# Patient Record
Sex: Female | Born: 1960 | Race: White | Hispanic: No | Marital: Married | Smoking: Never smoker
Health system: Southern US, Community
[De-identification: ages and names within clinical notes are randomized; demographics above are authoritative.]

## PROBLEM LIST (undated history)

## (undated) DIAGNOSIS — E119 Type 2 diabetes mellitus without complications: Secondary | ICD-10-CM

## (undated) DIAGNOSIS — Z923 Personal history of irradiation: Secondary | ICD-10-CM

## (undated) DIAGNOSIS — I10 Essential (primary) hypertension: Secondary | ICD-10-CM

## (undated) DIAGNOSIS — K259 Gastric ulcer, unspecified as acute or chronic, without hemorrhage or perforation: Secondary | ICD-10-CM

## (undated) DIAGNOSIS — F329 Major depressive disorder, single episode, unspecified: Secondary | ICD-10-CM

## (undated) DIAGNOSIS — I499 Cardiac arrhythmia, unspecified: Secondary | ICD-10-CM

## (undated) DIAGNOSIS — F32A Depression, unspecified: Secondary | ICD-10-CM

## (undated) DIAGNOSIS — C73 Malignant neoplasm of thyroid gland: Secondary | ICD-10-CM

## (undated) HISTORY — PX: REPAIR TENDONS FOOT: SUR1209

## (undated) HISTORY — PX: TONSILLECTOMY: SUR1361

## (undated) HISTORY — PX: OTHER SURGICAL HISTORY: SHX169

## (undated) HISTORY — PX: TOTAL ABDOMINAL HYSTERECTOMY: SHX209

## (undated) HISTORY — PX: ABDOMINAL HYSTERECTOMY: SHX81

## (undated) HISTORY — DX: Malignant neoplasm of thyroid gland: C73

## (undated) HISTORY — PX: TUBAL LIGATION: SHX77

## (undated) HISTORY — PX: BREAST REDUCTION SURGERY: SHX8

## (undated) HISTORY — PX: LUMBAR DISC SURGERY: SHX700

---

## 2003-12-09 ENCOUNTER — Inpatient Hospital Stay: Payer: Self-pay

## 2004-01-11 ENCOUNTER — Other Ambulatory Visit: Payer: Self-pay

## 2004-01-18 ENCOUNTER — Inpatient Hospital Stay: Payer: Self-pay | Admitting: Neonatology

## 2004-01-23 HISTORY — PX: ANTERIOR FUSION CERVICAL SPINE: SUR626

## 2005-08-25 ENCOUNTER — Ambulatory Visit: Payer: Self-pay | Admitting: Physician Assistant

## 2005-10-10 ENCOUNTER — Encounter: Payer: Self-pay | Admitting: Unknown Physician Specialty

## 2005-10-23 ENCOUNTER — Ambulatory Visit: Payer: Self-pay | Admitting: Unknown Physician Specialty

## 2007-09-03 ENCOUNTER — Ambulatory Visit: Payer: Self-pay | Admitting: Obstetrics and Gynecology

## 2008-08-18 ENCOUNTER — Emergency Department: Payer: Self-pay | Admitting: Emergency Medicine

## 2008-09-08 ENCOUNTER — Ambulatory Visit: Payer: Self-pay | Admitting: Obstetrics and Gynecology

## 2009-01-22 HISTORY — PX: REDUCTION MAMMAPLASTY: SUR839

## 2009-01-22 HISTORY — PX: ESOPHAGOGASTRODUODENOSCOPY: SHX1529

## 2009-01-23 LAB — HM COLONOSCOPY: HM Colonoscopy: NORMAL

## 2009-05-23 ENCOUNTER — Ambulatory Visit: Payer: Self-pay | Admitting: Gastroenterology

## 2010-01-22 HISTORY — PX: LITHOTRIPSY: SUR834

## 2010-02-08 ENCOUNTER — Ambulatory Visit: Payer: Self-pay | Admitting: Family Medicine

## 2010-10-08 ENCOUNTER — Emergency Department: Payer: Self-pay | Admitting: Emergency Medicine

## 2010-10-16 ENCOUNTER — Ambulatory Visit: Payer: Self-pay | Admitting: Urology

## 2010-10-18 ENCOUNTER — Ambulatory Visit: Payer: Self-pay | Admitting: Urology

## 2010-10-24 ENCOUNTER — Ambulatory Visit: Payer: Self-pay | Admitting: Urology

## 2010-11-09 ENCOUNTER — Ambulatory Visit: Payer: Self-pay | Admitting: Anesthesiology

## 2010-11-27 ENCOUNTER — Ambulatory Visit: Payer: Self-pay | Admitting: Urology

## 2011-06-05 ENCOUNTER — Ambulatory Visit: Payer: Self-pay | Admitting: Internal Medicine

## 2012-04-10 ENCOUNTER — Ambulatory Visit: Payer: Self-pay | Admitting: Internal Medicine

## 2012-05-07 ENCOUNTER — Ambulatory Visit: Payer: Self-pay | Admitting: Internal Medicine

## 2012-06-19 ENCOUNTER — Emergency Department: Payer: Self-pay | Admitting: Emergency Medicine

## 2012-10-28 ENCOUNTER — Ambulatory Visit: Payer: Self-pay | Admitting: Internal Medicine

## 2013-01-22 HISTORY — PX: NASAL SINUS SURGERY: SHX719

## 2013-05-14 ENCOUNTER — Ambulatory Visit: Payer: Self-pay | Admitting: Otolaryngology

## 2014-01-13 ENCOUNTER — Ambulatory Visit: Payer: Self-pay | Admitting: Internal Medicine

## 2014-01-13 LAB — HM MAMMOGRAPHY: HM MAMMO: NORMAL

## 2014-01-29 LAB — LIPID PANEL
Cholesterol: 236 mg/dL — AB (ref 0–200)
HDL: 46 mg/dL (ref 35–70)
LDL Cholesterol: 152 mg/dL
TRIGLYCERIDES: 191 mg/dL — AB (ref 40–160)

## 2014-01-29 LAB — TSH: TSH: 1.6 u[IU]/mL (ref <=5.90)

## 2014-07-03 ENCOUNTER — Encounter: Payer: Self-pay | Admitting: Internal Medicine

## 2014-07-03 ENCOUNTER — Other Ambulatory Visit: Payer: Self-pay | Admitting: Internal Medicine

## 2014-07-03 DIAGNOSIS — F5101 Primary insomnia: Secondary | ICD-10-CM | POA: Insufficient documentation

## 2014-07-03 DIAGNOSIS — R000 Tachycardia, unspecified: Secondary | ICD-10-CM

## 2014-07-03 DIAGNOSIS — G44209 Tension-type headache, unspecified, not intractable: Secondary | ICD-10-CM | POA: Insufficient documentation

## 2014-07-03 DIAGNOSIS — M62830 Muscle spasm of back: Secondary | ICD-10-CM | POA: Insufficient documentation

## 2014-07-03 DIAGNOSIS — Z8711 Personal history of peptic ulcer disease: Secondary | ICD-10-CM | POA: Insufficient documentation

## 2014-07-03 DIAGNOSIS — G43009 Migraine without aura, not intractable, without status migrainosus: Secondary | ICD-10-CM | POA: Insufficient documentation

## 2014-07-03 HISTORY — DX: Tachycardia, unspecified: R00.0

## 2014-07-06 ENCOUNTER — Ambulatory Visit (INDEPENDENT_AMBULATORY_CARE_PROVIDER_SITE_OTHER): Payer: No Typology Code available for payment source | Admitting: Internal Medicine

## 2014-07-06 ENCOUNTER — Ambulatory Visit
Admission: RE | Admit: 2014-07-06 | Discharge: 2014-07-06 | Disposition: A | Discharge status: Home / Self Care | Payer: No Typology Code available for payment source | Source: Ambulatory Visit | Attending: Internal Medicine | Admitting: Internal Medicine

## 2014-07-06 ENCOUNTER — Encounter: Payer: Self-pay | Admitting: Internal Medicine

## 2014-07-06 VITALS — BP 136/78 | HR 72 | Ht 67.0 in | Wt 229.2 lb

## 2014-07-06 DIAGNOSIS — M549 Dorsalgia, unspecified: Secondary | ICD-10-CM | POA: Diagnosis not present

## 2014-07-06 DIAGNOSIS — R109 Unspecified abdominal pain: Secondary | ICD-10-CM

## 2014-07-06 DIAGNOSIS — J0101 Acute recurrent maxillary sinusitis: Secondary | ICD-10-CM

## 2014-07-06 MED ORDER — AMOXICILLIN-POT CLAVULANATE 875-125 MG PO TABS: 1.0000 | ORAL_TABLET | Freq: Two times a day (BID) | ORAL | Status: DC

## 2014-07-06 NOTE — Progress Notes (Signed)
Date:  07/06/2014   Name:  Gabrielle Tucker   DOB:  April 28, 1960   MRN:  [id number]   Chief Complaint: Sinusitis and Nephrolithiasis   History of Present Illness:  This is a 54 y.o. female who is presenting with the above issues. Sinusitis This is a new problem. The current episode started in the past 7 days. The problem has been gradually worsening since onset. There has been no fever. Associated symptoms include congestion (bringing up yellow phlegm), a hoarse voice, sinus pressure and a sore throat. Pertinent negatives include no chills, coughing, ear pain or headaches. Past treatments include oral decongestants (mucinex and neti pot).   Kidney stones Has a history of one previous stone in 2012 treated with lithotripsy by Dr. Madelin Headings at [facility].  She is not aware of any additional kidney stones noted.  Now having symptoms on left flank - sharp and intermittent enough to take her breath away.  Episodes are variable and started 2 weeks ago.  There has been no fever or hematuria.   Review of Systems:  Review of Systems  Constitutional: Negative for fever and chills.  HENT: Positive for congestion (bringing up yellow phlegm), hoarse voice, sinus pressure and sore throat. Negative for ear pain and hearing loss.   Respiratory: Negative for cough.   Genitourinary: Negative for dysuria and hematuria.  Musculoskeletal: Positive for back pain (left flank pain - unchanged and intermittent).  Neurological: Negative for dizziness and headaches.    Patient Active Problem List   Diagnosis Date Noted  . Back muscle spasm 07/03/2014  . H/O peptic ulcer 07/03/2014  . Migraine without aura and responsive to treatment 07/03/2014  . Muscle contraction headache 07/03/2014  . Idiopathic insomnia 07/03/2014  . Fast heart beat 07/03/2014    Prior to Admission medications   Medication Sig Start Date End Date Taking? Authorizing Provider  atenolol (TENORMIN) 50 MG tablet Take 1 tablet by mouth daily. 03/04/14   Yes Historical Provider, MD  fluticasone (FLONASE) 50 MCG/ACT nasal spray Place 2 sprays into the nose daily as needed. 03/29/14  Yes Historical Provider, MD  pantoprazole (PROTONIX) 40 MG tablet Take 1 tablet by mouth 2 (two) times daily. 04/04/14  Yes Historical Provider, MD  tiZANidine (ZANAFLEX) 4 MG tablet Take 1 tablet by mouth daily as needed. 03/29/14  Yes Historical Provider, MD  zaleplon (SONATA) 10 MG capsule Take 1 capsule by mouth at bedtime. 03/30/14  Yes Historical Provider, MD    No Known Allergies  Past Surgical History  Procedure Laterality Date  . Partail knee replacement Right   . Lumbar disc surgery    . Abdominal hysterectomy    . Breast reduction surgery    . Lithotripsy  2012    with setnet  . Nasal sinus surgery    . Esophagogastroduodenoscopy  2011    gastric ulcer    History  Substance Use Topics  . Smoking status: Never Smoker   . Smokeless tobacco: Not on file  . Alcohol Use: 1.2 oz/week    2 Standard drinks or equivalent per week     Medication list has been reviewed and updated.  Physical Examination:  Physical Exam  Constitutional: She is oriented to person, place, and time. She appears well-developed and well-nourished. No distress.  HENT:  Right Ear: External ear and ear canal normal. Tympanic membrane is not erythematous and not retracted.  Left Ear: External ear and ear canal normal. Tympanic membrane is not erythematous and not retracted.  Nose: Right sinus  exhibits maxillary sinus tenderness and frontal sinus tenderness. Left sinus exhibits maxillary sinus tenderness and frontal sinus tenderness.  Mouth/Throat: Uvula is midline and mucous membranes are normal. No oral lesions. No oropharyngeal exudate or posterior oropharyngeal erythema.  Eyes: Conjunctivae are normal.  Neck: Normal range of motion. Neck supple.  Cardiovascular: Normal rate, regular rhythm and normal heart sounds.   Pulmonary/Chest: Breath sounds normal. She has no wheezes.  She has no rales.  Lymphadenopathy:    She has no cervical adenopathy.  Neurological: She is alert and oriented to person, place, and time.  Skin: Skin is warm and dry.  Nursing note and vitals reviewed.   BP 136/78 mmHg  Pulse 72  Ht 5\' 7"  (1.702 m)  Wt 229 lb 3.2 oz (103.964 kg)  BMI 35.89 kg/m2  Assessment and Plan: 1. Acute recurrent maxillary sinusitis Continue otc therapy - amoxicillin-clavulanate (AUGMENTIN) 875-125 MG per tablet; Take 1 tablet by mouth 2 (two) times daily.  Dispense: 20 tablet; Refill: 0  2. Flank pain Possible kidney stone - not worsening UA negative If evidence of stone on KUB will refer back to [facility] - POCT urinalysis dipstick - DG Abd 1 View; Future   Halina Maidens, MD [facility]  07/06/2014

## 2014-07-19 ENCOUNTER — Ambulatory Visit
Admission: RE | Admit: 2014-07-19 | Discharge: 2014-07-19 | Disposition: A | Discharge status: Home / Self Care | Payer: No Typology Code available for payment source | Source: Ambulatory Visit | Attending: Internal Medicine | Admitting: Internal Medicine

## 2014-07-19 ENCOUNTER — Encounter: Payer: Self-pay | Admitting: Internal Medicine

## 2014-07-19 ENCOUNTER — Ambulatory Visit (INDEPENDENT_AMBULATORY_CARE_PROVIDER_SITE_OTHER): Payer: No Typology Code available for payment source | Admitting: Internal Medicine

## 2014-07-19 ENCOUNTER — Ambulatory Visit
Admission: RE | Admit: 2014-07-19 | Payer: No Typology Code available for payment source | Source: Ambulatory Visit | Admitting: *Deleted

## 2014-07-19 VITALS — BP 120/66 | HR 74 | Ht 66.0 in | Wt 228.0 lb

## 2014-07-19 DIAGNOSIS — R091 Pleurisy: Secondary | ICD-10-CM | POA: Diagnosis not present

## 2014-07-19 MED ORDER — METHYLPREDNISOLONE 4 MG PO TBPK: ORAL_TABLET | ORAL | Status: DC

## 2014-07-19 NOTE — Progress Notes (Signed)
Date:  07/19/2014   Name:  Gabrielle Tucker   DOB:  12-Mar-1960   MRN:  [id number]   Chief Complaint: Shortness of Breath Shortness of Breath This is a new problem. The current episode started yesterday. The problem occurs every few minutes. The problem has been unchanged. Associated symptoms include chest pain. Pertinent negatives include no abdominal pain, leg swelling or wheezing. There is no history of allergies or asthma.  She was recently treated for bronchitis - she finished antibiotics and symptoms of cough and congestion have resolved.  She denies hx of blood clots, leg swelling and recent prolonged immobilization.   Review of Systems:  Review of Systems  Respiratory: Positive for chest tightness and shortness of breath. Negative for cough and wheezing.   Cardiovascular: Positive for chest pain. Negative for leg swelling.  Gastrointestinal: Negative for abdominal pain.  Musculoskeletal: Negative for joint swelling and gait problem.    Patient Active Problem List   Diagnosis Date Noted  . Back muscle spasm 07/03/2014  . H/O peptic ulcer 07/03/2014  . Migraine without aura and responsive to treatment 07/03/2014  . Muscle contraction headache 07/03/2014  . Idiopathic insomnia 07/03/2014  . Fast heart beat 07/03/2014    Prior to Admission medications   Medication Sig Start Date End Date Taking? Authorizing Provider  amoxicillin-clavulanate (AUGMENTIN) 875-125 MG per tablet Take 1 tablet by mouth 2 (two) times daily. 07/06/14  Yes Glean Hess, MD  atenolol (TENORMIN) 50 MG tablet Take 1 tablet by mouth daily. 03/04/14  Yes Historical Provider, MD  fluticasone (FLONASE) 50 MCG/ACT nasal spray Place 2 sprays into the nose daily as needed. 03/29/14  Yes Historical Provider, MD  pantoprazole (PROTONIX) 40 MG tablet Take 1 tablet by mouth 2 (two) times daily. 04/04/14  Yes Historical Provider, MD  tiZANidine (ZANAFLEX) 4 MG tablet Take 1 tablet by mouth daily as needed. 03/29/14  Yes  Historical Provider, MD  zaleplon (SONATA) 10 MG capsule Take 1 capsule by mouth at bedtime. 03/30/14  Yes Historical Provider, MD    No Known Allergies  Past Surgical History  Procedure Laterality Date  . Partail knee replacement Right   . Lumbar disc surgery    . Abdominal hysterectomy    . Breast reduction surgery    . Lithotripsy  2012    with setnet  . Nasal sinus surgery  2015  . Esophagogastroduodenoscopy  2011    gastric ulcer    History  Substance Use Topics  . Smoking status: Never Smoker   . Smokeless tobacco: Not on file  . Alcohol Use: 1.2 oz/week    2 Standard drinks or equivalent per week     Medication list has been reviewed and updated.  Physical Examination:  Physical Exam  Constitutional: She is oriented to person, place, and time. She appears well-developed and well-nourished. No distress.  Neck: Neck supple. No thyromegaly present.  Cardiovascular: Normal rate, regular rhythm and normal heart sounds.   Pulmonary/Chest: Effort normal and breath sounds normal. No respiratory distress. She has no wheezes. She has no rales.  Musculoskeletal: She exhibits no edema or tenderness.  Lymphadenopathy:    She has no cervical adenopathy.  Neurological: She is alert and oriented to person, place, and time.  Psychiatric: She has a normal mood and affect.  Nursing note and vitals reviewed.   BP 120/66 mmHg  Pulse 74  Ht 5\' 6"  (1.676 m)  Wt 228 lb (103.42 kg)  BMI 36.82 kg/m2  SpO2 88%  Assessment  and Plan: 1. Pleurisy Suspected from recent URI Pt instructed to go to the ER if symptoms worsen to rule out PE - methylPREDNISolone (MEDROL DOSEPAK) 4 MG TBPK tablet; Take 6 po for one day, 5 po for one day, 4 po for one day, 3 po for one day, 2 po for one day then 1 po for one day then stop.  Dispense: 21 tablet; Refill: 0 - DG Chest 2 View; Future   Halina Maidens, MD [facility]  07/19/2014

## 2014-07-27 ENCOUNTER — Other Ambulatory Visit: Payer: Self-pay | Admitting: Cardiology

## 2014-07-27 DIAGNOSIS — K8020 Calculus of gallbladder without cholecystitis without obstruction: Secondary | ICD-10-CM

## 2014-07-30 ENCOUNTER — Ambulatory Visit
Admission: RE | Admit: 2014-07-30 | Discharge: 2014-07-30 | Disposition: A | Discharge status: Home / Self Care | Payer: No Typology Code available for payment source | Source: Ambulatory Visit | Attending: Cardiology | Admitting: Cardiology

## 2014-07-30 DIAGNOSIS — K760 Fatty (change of) liver, not elsewhere classified: Secondary | ICD-10-CM | POA: Diagnosis not present

## 2014-07-30 DIAGNOSIS — K8020 Calculus of gallbladder without cholecystitis without obstruction: Secondary | ICD-10-CM

## 2014-07-30 DIAGNOSIS — R1011 Right upper quadrant pain: Secondary | ICD-10-CM | POA: Diagnosis present

## 2014-08-03 DIAGNOSIS — K219 Gastro-esophageal reflux disease without esophagitis: Secondary | ICD-10-CM | POA: Insufficient documentation

## 2014-08-03 DIAGNOSIS — R079 Chest pain, unspecified: Secondary | ICD-10-CM | POA: Insufficient documentation

## 2014-08-05 ENCOUNTER — Encounter: Payer: Self-pay | Admitting: Internal Medicine

## 2014-08-06 ENCOUNTER — Encounter: Payer: Self-pay | Admitting: Internal Medicine

## 2014-08-06 ENCOUNTER — Ambulatory Visit (INDEPENDENT_AMBULATORY_CARE_PROVIDER_SITE_OTHER): Payer: No Typology Code available for payment source | Admitting: Internal Medicine

## 2014-08-06 VITALS — BP 124/64 | HR 60 | Ht 66.0 in | Wt 223.0 lb

## 2014-08-06 DIAGNOSIS — R079 Chest pain, unspecified: Secondary | ICD-10-CM | POA: Diagnosis not present

## 2014-08-06 DIAGNOSIS — Z8711 Personal history of peptic ulcer disease: Secondary | ICD-10-CM

## 2014-08-06 NOTE — Progress Notes (Signed)
Date:  08/06/2014   Name:  Gabrielle Tucker   DOB:  1960-07-07   MRN:  [id number]   Chief Complaint: No chief complaint on file. Follow up from Hospital admission for atypical chest pain.  EKG, CXR and CT chest were all negative.  Labs were normal.  She had had a treadmill stress test three days before admission that was normal. She reports that the only relief came after a SL NTG - the relief lasted less than one hour.  She was discharged on the same medication except that Carafate was added - she has not yet picked it up. She has a history of gastric ulcer and esophagitis about 2011.  She had continued on pantoprazole daily.  Her pain is located in the epigastric and substernal area - it is pressure-like and associated at times with shortness of breath.  It is only worsening by lying on her left side.   Movement of arms and torso and deep breathing does not change her symptoms.  Review of Systems:  Review of Systems  Constitutional: Negative for fever, chills, appetite change and fatigue.  HENT: Negative for sore throat and trouble swallowing.   Eyes: Negative for visual disturbance.  Respiratory: Positive for chest tightness and shortness of breath. Negative for cough, choking and wheezing.   Cardiovascular: Positive for chest pain. Negative for palpitations and leg swelling.  Gastrointestinal: Negative for nausea, abdominal pain, diarrhea and constipation.  Musculoskeletal: Negative for back pain.  Neurological: Negative for dizziness, weakness, light-headedness and headaches.    Patient Active Problem List   Diagnosis Date Noted  . Acid reflux 08/03/2014  . Chest pain 08/03/2014  . Back muscle spasm 07/03/2014  . H/O peptic ulcer 07/03/2014  . Migraine without aura and responsive to treatment 07/03/2014  . Muscle contraction headache 07/03/2014  . Idiopathic insomnia 07/03/2014  . Fast heart beat 07/03/2014    Prior to Admission medications   Medication Sig Start Date End Date  Taking? Authorizing Provider  atenolol (TENORMIN) 50 MG tablet Take 1 tablet by mouth daily. 03/04/14  Yes Historical Provider, MD  fluticasone (FLONASE) 50 MCG/ACT nasal spray Place 2 sprays into the nose daily as needed. 03/29/14  Yes Historical Provider, MD  pantoprazole (PROTONIX) 40 MG tablet Take 1 tablet by mouth 2 (two) times daily. 04/04/14  Yes Historical Provider, MD  tiZANidine (ZANAFLEX) 4 MG tablet Take 1 tablet by mouth daily as needed. 03/29/14  Yes Historical Provider, MD  zaleplon (SONATA) 10 MG capsule Take 1 capsule by mouth at bedtime. 03/30/14  Yes Historical Provider, MD  amoxicillin-clavulanate (AUGMENTIN) 875-125 MG per tablet Take 1 tablet by mouth 2 (two) times daily. Patient not taking: Reported on 08/06/2014 07/06/14   Glean Hess, MD  methylPREDNISolone (MEDROL DOSEPAK) 4 MG TBPK tablet Take 6 po for one day, 5 po for one day, 4 po for one day, 3 po for one day, 2 po for one day then 1 po for one day then stop. Patient not taking: Reported on 08/06/2014 07/19/14   Glean Hess, MD  sucralfate Ivin Booty) 1 G tablet  08/03/14   Historical Provider, MD    No Known Allergies  Past Surgical History  Procedure Laterality Date  . Partail knee replacement Right   . Lumbar disc surgery    . Abdominal hysterectomy    . Breast reduction surgery    . Lithotripsy  2012    with setnet  . Nasal sinus surgery  2015  . Esophagogastroduodenoscopy  2011    gastric ulcer    History  Substance Use Topics  . Smoking status: Never Smoker   . Smokeless tobacco: Not on file  . Alcohol Use: 1.2 oz/week    2 Standard drinks or equivalent per week     Medication list has been reviewed and updated.  Physical Examination:  Physical Exam  Constitutional: She appears well-developed and well-nourished. No distress.  Neck: Normal range of motion. Neck supple. No thyromegaly present.  Cardiovascular: Normal rate, regular rhythm and normal heart sounds.   Pulmonary/Chest: Effort  normal and breath sounds normal. She has no wheezes.  Musculoskeletal: She exhibits no edema.  Psychiatric: She has a normal mood and affect.    BP 124/64 mmHg  Pulse 60  Ht 5\' 6"  (1.676 m)  Wt 223 lb (101.152 kg)  BMI 36.01 kg/m2  Assessment and Plan: 1. Chest pain, unspecified chest pain type Cardiac and pulmonary etiology essentially ruled out Need GI evaluation - Ambulatory referral to Gastroenterology  2. H/O peptic ulcer Pt to continue pantoprazole and begin Carafate slurry 1 gm qid Refer to GI for consideration of EGD and/or esophageal manometry (suspicion for a motility disorder given response to NTG) She has seen Dr. Donnella Sham in the past - Ambulatory referral to Gastroenterology   Halina Maidens, MD [facility]  08/06/2014

## 2014-08-09 ENCOUNTER — Inpatient Hospital Stay: Payer: Self-pay | Admitting: Internal Medicine

## 2014-10-05 ENCOUNTER — Telehealth: Payer: Self-pay

## 2014-10-05 MED ORDER — FLUCONAZOLE 100 MG PO TABS: 100.0000 mg | ORAL_TABLET | Freq: Every day | ORAL | Status: DC

## 2014-10-05 NOTE — Telephone Encounter (Signed)
Patient has yeast infection, Monistat does not work, would like something called in.dr

## 2014-10-11 ENCOUNTER — Other Ambulatory Visit: Payer: Self-pay | Admitting: Internal Medicine

## 2014-10-11 MED ORDER — ZALEPLON 10 MG PO CAPS: 10.0000 mg | ORAL_CAPSULE | Freq: Every day | ORAL | Status: DC

## 2014-10-11 MED ORDER — TIZANIDINE HCL 4 MG PO TABS: 4.0000 mg | ORAL_TABLET | Freq: Every day | ORAL | Status: DC | PRN

## 2014-10-27 ENCOUNTER — Telehealth: Payer: Self-pay

## 2014-10-27 NOTE — Telephone Encounter (Signed)
Patient informed.dr 

## 2014-10-27 NOTE — Telephone Encounter (Signed)
Patient having vaginal dryness, needs you to recommend something for this OTC or call in something.dr

## 2014-10-27 NOTE — Telephone Encounter (Signed)
I suggest she try over-the-counter KY jelly lubricant. If this does not help and she needs a prescription medication and she will need an appointment.

## 2014-12-29 ENCOUNTER — Ambulatory Visit (INDEPENDENT_AMBULATORY_CARE_PROVIDER_SITE_OTHER): Payer: No Typology Code available for payment source | Admitting: Internal Medicine

## 2014-12-29 ENCOUNTER — Encounter: Payer: Self-pay | Admitting: Internal Medicine

## 2014-12-29 VITALS — BP 110/70 | HR 80 | Temp 97.8°F | Ht 66.0 in | Wt 225.0 lb

## 2014-12-29 DIAGNOSIS — J018 Other acute sinusitis: Secondary | ICD-10-CM

## 2014-12-29 MED ORDER — CEFDINIR 300 MG PO CAPS: 300.0000 mg | ORAL_CAPSULE | Freq: Two times a day (BID) | ORAL | Status: DC

## 2014-12-29 MED ORDER — FLUTICASONE PROPIONATE 50 MCG/ACT NA SUSP: 2.0000 | Freq: Every day | NASAL | Status: DC

## 2014-12-29 NOTE — Progress Notes (Signed)
Date:  12/29/2014   Name:  Gabrielle Tucker   DOB:  05/27/1960   MRN:  [id number]   Chief Complaint: Sinusitis; Cough; and Sore Throat  Patient started off 4-5 days ago with a sore throat. This progressed to sinus pressure, nasal congestion, throat fullness and dry cough. She's been using Mucinex D and sinus rinses. She denies fever, shortness of breath, wheezing. She is able to rest without significant disruption.   Review of Systems  Constitutional: Positive for fatigue. Negative for fever, chills and unexpected weight change.  HENT: Positive for congestion, postnasal drip, sinus pressure and sore throat. Negative for ear pain and hearing loss.   Eyes: Negative for visual disturbance.  Respiratory: Negative for cough, chest tightness, shortness of breath and wheezing.   Cardiovascular: Negative for chest pain and palpitations.  Musculoskeletal: Negative for neck pain and neck stiffness.  Skin: Negative for color change and rash.  Neurological: Positive for headaches. Negative for dizziness, light-headedness and numbness.    Patient Active Problem List   Diagnosis Date Noted  . Acid reflux 08/03/2014  . Chest pain 08/03/2014  . Back muscle spasm 07/03/2014  . H/O peptic ulcer 07/03/2014  . Migraine without aura and responsive to treatment 07/03/2014  . Muscle contraction headache 07/03/2014  . Idiopathic insomnia 07/03/2014  . Fast heart beat 07/03/2014    Prior to Admission medications   Medication Sig Start Date End Date Taking? Authorizing Provider  atenolol (TENORMIN) 50 MG tablet Take 1 tablet by mouth daily. 03/04/14  Yes Historical Provider, MD  fluticasone (FLONASE) 50 MCG/ACT nasal spray Place 2 sprays into the nose daily as needed. 03/29/14  Yes Historical Provider, MD  pantoprazole (PROTONIX) 40 MG tablet Take 1 tablet by mouth 2 (two) times daily. 04/04/14  Yes Historical Provider, MD  tiZANidine (ZANAFLEX) 4 MG tablet Take 1 tablet (4 mg total) by mouth daily as needed.  10/11/14  Yes Glean Hess, MD  zaleplon (SONATA) 10 MG capsule Take 1 capsule (10 mg total) by mouth at bedtime. 10/11/14  Yes Glean Hess, MD    No Known Allergies  Past Surgical History  Procedure Laterality Date  . Partail knee replacement Right   . Lumbar disc surgery    . Abdominal hysterectomy    . Breast reduction surgery    . Lithotripsy  2012    with setnet  . Nasal sinus surgery  2015  . Esophagogastroduodenoscopy  2011    gastric ulcer    Social History  Substance Use Topics  . Smoking status: Never Smoker   . Smokeless tobacco: None  . Alcohol Use: 1.2 oz/week    2 Standard drinks or equivalent per week    Medication list has been reviewed and updated.   Physical Exam  Constitutional: She is oriented to person, place, and time. She appears well-developed and well-nourished. No distress.  HENT:  Head: Normocephalic and atraumatic.  Right Ear: External ear and ear canal normal. Tympanic membrane is retracted. Tympanic membrane is not erythematous.  Left Ear: External ear and ear canal normal. Tympanic membrane is retracted. Tympanic membrane is not erythematous.  Nose: Right sinus exhibits frontal sinus tenderness. Right sinus exhibits no maxillary sinus tenderness. Left sinus exhibits frontal sinus tenderness. Left sinus exhibits no maxillary sinus tenderness.  Mouth/Throat: Uvula is midline, oropharynx is clear and moist and mucous membranes are normal. No oral lesions. No oropharyngeal exudate.  Eyes: Conjunctivae and EOM are normal. Right eye exhibits no discharge. Left eye exhibits no  discharge. No scleral icterus.  Neck: Normal range of motion. Carotid bruit is not present. No erythema present. No thyromegaly present.  Cardiovascular: Normal rate, regular rhythm, normal heart sounds and normal pulses.   Pulmonary/Chest: Effort normal and breath sounds normal. No respiratory distress. She has no wheezes. She has no rales.  Lymphadenopathy:    She has  no cervical adenopathy.  Neurological: She is alert and oriented to person, place, and time. She has normal reflexes.  Psychiatric: She has a normal mood and affect. Her speech is normal and behavior is normal. Thought content normal.  Nursing note and vitals reviewed.   BP 110/70 mmHg  Pulse 80  Temp(Src) 97.8 F (36.6 C)  Ht 5\' 6"  (1.676 m)  Wt 225 lb (102.059 kg)  BMI 36.33 kg/m2  SpO2 97%  Assessment and Plan: 1. Other acute sinusitis Resume Flonase; continue sinus rinses, continue Mucinex D - fluticasone (FLONASE) 50 MCG/ACT nasal spray; Place 2 sprays into both nostrils daily.  Dispense: 16 g; Refill: 3 - cefdinir (OMNICEF) 300 MG capsule; Take 1 capsule (300 mg total) by mouth 2 (two) times daily.  Dispense: 20 capsule; Refill: 0   Halina Maidens, MD [facility]  12/29/2014

## 2015-01-21 ENCOUNTER — Ambulatory Visit (INDEPENDENT_AMBULATORY_CARE_PROVIDER_SITE_OTHER): Payer: No Typology Code available for payment source | Admitting: Internal Medicine

## 2015-01-21 ENCOUNTER — Encounter: Payer: Self-pay | Admitting: Internal Medicine

## 2015-01-21 VITALS — BP 110/60 | HR 76 | Ht 66.0 in | Wt 224.6 lb

## 2015-01-21 DIAGNOSIS — G44209 Tension-type headache, unspecified, not intractable: Secondary | ICD-10-CM

## 2015-01-21 DIAGNOSIS — Z1159 Encounter for screening for other viral diseases: Secondary | ICD-10-CM | POA: Diagnosis not present

## 2015-01-21 DIAGNOSIS — R000 Tachycardia, unspecified: Secondary | ICD-10-CM | POA: Diagnosis not present

## 2015-01-21 DIAGNOSIS — F5101 Primary insomnia: Secondary | ICD-10-CM | POA: Diagnosis not present

## 2015-01-21 DIAGNOSIS — Z1239 Encounter for other screening for malignant neoplasm of breast: Secondary | ICD-10-CM | POA: Diagnosis not present

## 2015-01-21 DIAGNOSIS — Z0000 Encounter for general adult medical examination without abnormal findings: Secondary | ICD-10-CM

## 2015-01-21 DIAGNOSIS — Z8711 Personal history of peptic ulcer disease: Secondary | ICD-10-CM

## 2015-01-21 DIAGNOSIS — G43009 Migraine without aura, not intractable, without status migrainosus: Secondary | ICD-10-CM

## 2015-01-21 LAB — POCT URINALYSIS DIPSTICK
Bilirubin, UA: NEGATIVE
Blood, UA: NEGATIVE
GLUCOSE UA: NEGATIVE
Ketones, UA: NEGATIVE
Leukocytes, UA: NEGATIVE
Nitrite, UA: NEGATIVE
Protein, UA: NEGATIVE
SPEC GRAV UA: 1.02
UROBILINOGEN UA: 0.2
pH, UA: 5

## 2015-01-21 NOTE — Progress Notes (Signed)
Date:  01/21/2015   Name:  Gabrielle Tucker   DOB:  29-Jul-1960   MRN:  [id number]   Chief Complaint: Annual Exam; Headache; and Gastroesophageal Reflux Gabrielle Tucker is a 54 y.o. female who presents today for her Complete Annual Exam. She feels well. She reports exercising none. She reports she is sleeping well on medication. She is due for mammogram.  Headache  This is a recurrent problem. The current episode started more than 1 year ago. The problem occurs monthly. The problem has been unchanged. Associated symptoms include abdominal pain and muscle aches. Pertinent negatives include no fever, hearing loss, nausea, numbness or tinnitus.  Gastroesophageal Reflux She complains of abdominal pain and heartburn. She reports no chest pain, no nausea or no wheezing. This is a recurrent problem. The current episode started more than 1 year ago. The problem occurs occasionally. The symptoms are aggravated by certain foods. Pertinent negatives include no fatigue. She has tried a PPI for the symptoms. Past procedures include an EGD.    Review of Systems  Constitutional: Negative for fever, chills and fatigue.  HENT: Negative for hearing loss, tinnitus, trouble swallowing and voice change.   Eyes: Negative for visual disturbance.  Respiratory: Negative for chest tightness, shortness of breath and wheezing.   Cardiovascular: Negative for chest pain, palpitations and leg swelling.  Gastrointestinal: Positive for heartburn and abdominal pain. Negative for nausea, diarrhea and constipation.  Genitourinary: Negative for dysuria, vaginal bleeding and vaginal discharge.  Musculoskeletal: Positive for arthralgias (knee). Negative for myalgias and joint swelling.  Skin: Negative for color change and rash.  Neurological: Positive for headaches. Negative for tremors, syncope and numbness.  Hematological: Negative for adenopathy. Does not bruise/bleed easily.  Psychiatric/Behavioral: Positive for  sleep disturbance. Negative for dysphoric mood and decreased concentration.    Patient Active Problem List   Diagnosis Date Noted  . Chest pain 08/03/2014  . Back muscle spasm 07/03/2014  . H/O peptic ulcer 07/03/2014  . Migraine without aura and responsive to treatment 07/03/2014  . Muscle contraction headache 07/03/2014  . Idiopathic insomnia 07/03/2014  . Fast heart beat 07/03/2014    Prior to Admission medications   Medication Sig Start Date End Date Taking? Authorizing Provider  atenolol (TENORMIN) 50 MG tablet Take 1 tablet by mouth daily. 03/04/14  Yes Historical Provider, MD  fluticasone (FLONASE) 50 MCG/ACT nasal spray Place 2 sprays into both nostrils daily. 12/29/14  Yes Glean Hess, MD  pantoprazole (PROTONIX) 40 MG tablet Take 1 tablet by mouth 2 (two) times daily. 04/04/14  Yes Historical Provider, MD  tiZANidine (ZANAFLEX) 4 MG tablet Take 1 tablet (4 mg total) by mouth daily as needed. 10/11/14  Yes Glean Hess, MD  zaleplon (SONATA) 10 MG capsule Take 1 capsule (10 mg total) by mouth at bedtime. 10/11/14  Yes Glean Hess, MD    No Known Allergies  Past Surgical History  Procedure Laterality Date  . Partail knee replacement Right   . Lumbar disc surgery    . Abdominal hysterectomy    . Breast reduction surgery    . Lithotripsy  2012    with setnet  . Nasal sinus surgery  2015  . Esophagogastroduodenoscopy  2011    gastric ulcer    Social History  Substance Use Topics  . Smoking status: Never Smoker   . Smokeless tobacco: None  . Alcohol Use: 1.2 oz/week    2 Standard drinks or equivalent per week    Medication list  has been reviewed and updated.   Physical Exam  Constitutional: She is oriented to person, place, and time. She appears well-developed and well-nourished. No distress.  HENT:  Head: Normocephalic and atraumatic.  Right Ear: Tympanic membrane and ear canal normal.  Left Ear: Tympanic membrane and ear canal normal.  Nose: Right  sinus exhibits no maxillary sinus tenderness. Left sinus exhibits no maxillary sinus tenderness.  Mouth/Throat: Uvula is midline and oropharynx is clear and moist.  Eyes: Conjunctivae and EOM are normal. Right eye exhibits no discharge. Left eye exhibits no discharge. No scleral icterus.  Neck: Normal range of motion. Carotid bruit is not present. No erythema present. No thyromegaly present.  Cardiovascular: Normal rate, regular rhythm, normal heart sounds and normal pulses.   Pulmonary/Chest: Effort normal. No respiratory distress. She has no wheezes. Right breast exhibits skin change. Right breast exhibits no mass, no nipple discharge and no tenderness. Left breast exhibits skin change. Left breast exhibits no mass, no nipple discharge and no tenderness.  Bilateral breast reduction scars  Abdominal: Soft. Bowel sounds are normal. There is no hepatosplenomegaly. There is no tenderness. There is no CVA tenderness.  Musculoskeletal: Normal range of motion.  Lymphadenopathy:    She has no cervical adenopathy.    She has no axillary adenopathy.  Neurological: She is alert and oriented to person, place, and time. She has normal reflexes. No cranial nerve deficit or sensory deficit.  Skin: Skin is warm, dry and intact. No rash noted.  Psychiatric: She has a normal mood and affect. Her speech is normal and behavior is normal. Thought content normal.  Nursing note and vitals reviewed.   BP 110/60 mmHg  Pulse 76  Ht 5\' 6"  (1.676 m)  Wt 224 lb 9.6 oz (101.878 kg)  BMI 36.27 kg/m2  Assessment and Plan: 1. Annual physical exam Normal exam except for weight - recommend beginning regular exercise - POCT urinalysis dipstick  2. Breast cancer screening - MM DIGITAL SCREENING BILATERAL; Future  3. Migraine without aura and responsive to treatment Doing well - using muscle relaxants as needed  4. Idiopathic insomnia Controlled with medication  5. H/O peptic ulcer On bid PPI - CBC with  Differential/Platelet  6. Tachycardia Controlled by beta blocker - Comprehensive metabolic panel - TSH  7. Muscle contraction headache  8. Need for hepatitis C screening test - Hepatitis C antibody   Gabrielle Maidens, MD [facility]  01/21/2015

## 2015-01-21 NOTE — Patient Instructions (Signed)
Tdap Vaccine (Tetanus, Diphtheria and Pertussis): What You Need to Know 1. Why get vaccinated? Tetanus, diphtheria and pertussis are very serious diseases. Tdap vaccine can protect us from these diseases. And, Tdap vaccine given to pregnant women can protect newborn babies against pertussis. TETANUS (Lockjaw) is rare in the [place] today. It causes painful muscle tightening and stiffness, usually all over the body.  It can lead to tightening of muscles in the head and neck so you can't open your mouth, swallow, or sometimes even breathe. Tetanus kills about 1 out of 10 people who are infected even after receiving the best medical care. DIPHTHERIA is also rare in the [place] today. It can cause a thick coating to form in the back of the throat.  It can lead to breathing problems, heart failure, paralysis, and death. PERTUSSIS (Whooping Cough) causes severe coughing spells, which can cause difficulty breathing, vomiting and disturbed sleep.  It can also lead to weight loss, incontinence, and rib fractures. Up to 2 in 100 adolescents and 5 in 100 adults with pertussis are hospitalized or have complications, which could include pneumonia or death. These diseases are caused by bacteria. Diphtheria and pertussis are spread from person to person through secretions from coughing or sneezing. Tetanus enters the body through cuts, scratches, or wounds. Before vaccines, as many as 200,000 cases of diphtheria, 200,000 cases of pertussis, and hundreds of cases of tetanus, were reported in the [place] each year. Since vaccination began, reports of cases for tetanus and diphtheria have dropped by about 99% and for pertussis by about 80%. 2. Tdap vaccine Tdap vaccine can protect adolescents and adults from tetanus, diphtheria, and pertussis. One dose of Tdap is routinely given at age 11 or 12. People who did not get Tdap at that age should get it as soon as possible. Tdap is especially important  for healthcare professionals and anyone having close contact with a baby younger than 12 months. Pregnant women should get a dose of Tdap during every pregnancy, to protect the newborn from pertussis. Infants are most at risk for severe, life-threatening complications from pertussis. Another vaccine, called Td, protects against tetanus and diphtheria, but not pertussis. A Td booster should be given every 10 years. Tdap may be given as one of these boosters if you have never gotten Tdap before. Tdap may also be given after a severe cut or burn to prevent tetanus infection. Your doctor or the person giving you the vaccine can give you more information. Tdap may safely be given at the same time as other vaccines. 3. Some people should not get this vaccine  A person who has ever had a life-threatening allergic reaction after a previous dose of any diphtheria, tetanus or pertussis containing vaccine, OR has a severe allergy to any part of this vaccine, should not get Tdap vaccine. Tell the person giving the vaccine about any severe allergies.  Anyone who had coma or long repeated seizures within 7 days after a childhood dose of DTP or DTaP, or a previous dose of Tdap, should not get Tdap, unless a cause other than the vaccine was found. They can still get Td.  Talk to your doctor if you:  have seizures or another nervous system problem,  had severe pain or swelling after any vaccine containing diphtheria, tetanus or pertussis,  ever had a condition called Guillain-Barr Syndrome (GBS),  aren't feeling well on the day the shot is scheduled. 4. Risks With any medicine, including vaccines, there is   a chance of side effects. These are usually mild and go away on their own. Serious reactions are also possible but are rare. Most people who get Tdap vaccine do not have any problems with it. Mild problems following Tdap (Did not interfere with activities)  Pain where the shot was given (about 3 in 4  adolescents or 2 in 3 adults)  Redness or swelling where the shot was given (about 1 person in 5)  Mild fever of at least 100.4F (up to about 1 in 25 adolescents or 1 in 100 adults)  Headache (about 3 or 4 people in 10)  Tiredness (about 1 person in 3 or 4)  Nausea, vomiting, diarrhea, stomach ache (up to 1 in 4 adolescents or 1 in 10 adults)  Chills, sore joints (about 1 person in 10)  Body aches (about 1 person in 3 or 4)  Rash, swollen glands (uncommon) Moderate problems following Tdap (Interfered with activities, but did not require medical attention)  Pain where the shot was given (up to 1 in 5 or 6)  Redness or swelling where the shot was given (up to about 1 in 16 adolescents or 1 in 12 adults)  Fever over 102F (about 1 in 100 adolescents or 1 in 250 adults)  Headache (about 1 in 7 adolescents or 1 in 10 adults)  Nausea, vomiting, diarrhea, stomach ache (up to 1 or 3 people in 100)  Swelling of the entire arm where the shot was given (up to about 1 in 500). Severe problems following Tdap (Unable to perform usual activities; required medical attention)  Swelling, severe pain, bleeding and redness in the arm where the shot was given (rare). Problems that could happen after any vaccine:  People sometimes faint after a medical procedure, including vaccination. Sitting or lying down for about 15 minutes can help prevent fainting, and injuries caused by a fall. Tell your doctor if you feel dizzy, or have vision changes or ringing in the ears.  Some people get severe pain in the shoulder and have difficulty moving the arm where a shot was given. This happens very rarely.  Any medication can cause a severe allergic reaction. Such reactions from a vaccine are very rare, estimated at fewer than 1 in a million doses, and would happen within a few minutes to a few hours after the vaccination. As with any medicine, there is a very remote chance of a vaccine causing a serious  injury or death. The safety of vaccines is always being monitored. For more information, visit: www.cdc.gov/vaccinesafety/ 5. What if there is a serious problem? What should I look for?  Look for anything that concerns you, such as signs of a severe allergic reaction, very high fever, or unusual behavior.  Signs of a severe allergic reaction can include hives, swelling of the face and throat, difficulty breathing, a fast heartbeat, dizziness, and weakness. These would usually start a few minutes to a few hours after the vaccination. What should I do?  If you think it is a severe allergic reaction or other emergency that can't wait, call 9-1-1 or get the person to the nearest hospital. Otherwise, call your doctor.  Afterward, the reaction should be reported to the Vaccine Adverse Event Reporting System (VAERS). Your doctor might file this report, or you can do it yourself through the VAERS web site at www.vaers.hhs.gov, or by calling 1-800-822-7967. VAERS does not give medical advice.  6. The National Vaccine Injury Compensation Program The National Vaccine Injury Compensation Program (  VICP) is a federal program that was created to compensate people who may have been injured by certain vaccines. Persons who believe they may have been injured by a vaccine can learn about the program and about filing a claim by calling (623)641-9139 or visiting the [facility] website at GoldCloset.com.ee. There is a time limit to file a claim for compensation. 7. How can I learn more?  Ask your doctor. He or she can give you the vaccine package insert or suggest other sources of information.  Call your local or state health department.  Contact the Centers for Disease Control and Prevention (CDC):  Call (610)861-6046 (1-800-CDC-INFO) or  Visit CDC's website at http://hunter.com/ CDC Tdap Vaccine VIS (03/17/13)   This information is not intended to replace advice given to you by your health care  provider. Make sure you discuss any questions you have with your health care provider.   Document Released: 07/10/2011 Document Revised: 01/29/2014 Document Reviewed: 04/22/2013 Elsevier Interactive Patient Education 2016 [place] Practicing breast self-awareness may pick up problems early, prevent significant medical complications, and possibly save your life. By practicing breast self-awareness, you can become familiar with how your breasts look and feel and if your breasts are changing. This allows you to notice changes early. It can also offer you some reassurance that your breast health is good. One way to learn what is normal for your breasts and whether your breasts are changing is to do a breast self-exam. If you find a lump or something that was not present in the past, it is best to contact your caregiver right away. Other findings that should be evaluated by your caregiver include nipple discharge, especially if it is bloody; skin changes or reddening; areas where the skin seems to be pulled in (retracted); or new lumps and bumps. Breast pain is seldom associated with cancer (malignancy), but should also be evaluated by a caregiver. HOW TO PERFORM A BREAST SELF-EXAM The best time to examine your breasts is 5-7 days after your menstrual period is over. During menstruation, the breasts are lumpier, and it may be more difficult to pick up changes. If you do not menstruate, have reached menopause, or had your uterus removed (hysterectomy), you should examine your breasts at regular intervals, such as monthly. If you are breastfeeding, examine your breasts after a feeding or after using a breast pump. Breast implants do not decrease the risk for lumps or tumors, so continue to perform breast self-exams as recommended. Talk to your caregiver about how to determine the difference between the implant and breast tissue. Also, talk about the amount of pressure you should use  during the exam. Over time, you will become more familiar with the variations of your breasts and more comfortable with the exam. A breast self-exam requires you to remove all your clothes above the waist.  Look at your breasts and nipples. Stand in front of a mirror in a room with good lighting. With your hands on your hips, push your hands firmly downward. Look for a difference in shape, contour, and size from one breast to the other (asymmetry). Asymmetry includes puckers, dips, or bumps. Also, look for skin changes, such as reddened or scaly areas on the breasts. Look for nipple changes, such as discharge, dimpling, repositioning, or redness.  Carefully feel your breasts. This is best done either in the shower or tub while using soapy water or when flat on your back. Place the arm (on the side of the breast you  are examining) above your head. Use the pads (not the fingertips) of your three middle fingers on your opposite hand to feel your breasts. Start in the underarm area and use  inch (2 cm) overlapping circles to feel your breast. Use 3 different levels of pressure (light, medium, and firm pressure) at each circle before moving to the next circle. The light pressure is needed to feel the tissue closest to the skin. The medium pressure will help to feel breast tissue a little deeper, while the firm pressure is needed to feel the tissue close to the ribs. Continue the overlapping circles, moving downward over the breast until you feel your ribs below your breast. Then, move one finger-width towards the center of the body. Continue to use the  inch (2 cm) overlapping circles to feel your breast as you move slowly up toward the collar bone (clavicle) near the base of the neck. Continue the up and down exam using all 3 pressures until you reach the middle of the chest. Do this with each breast, carefully feeling for lumps or changes.   Keep a written record with breast changes or normal findings for each  breast. By writing this information down, you do not need to depend only on memory for size, tenderness, or location. Write down where you are in your menstrual cycle, if you are still menstruating. Breast tissue can have some lumps or thick tissue. However, see your caregiver if you find anything that concerns you.  SEEK MEDICAL CARE IF:  You see a change in shape, contour, or size of your breasts or nipples.   You see skin changes, such as reddened or scaly areas on the breasts or nipples.   You have an unusual discharge from your nipples.   You feel a new lump or unusually thick areas.    This information is not intended to replace advice given to you by your health care provider. Make sure you discuss any questions you have with your health care provider.   Document Released: 01/08/2005 Document Revised: 12/26/2011 Document Reviewed: 04/25/2011 Elsevier Interactive Patient Education [employer].

## 2015-01-22 LAB — COMPREHENSIVE METABOLIC PANEL
ALK PHOS: 103 IU/L (ref 39–117)
ALT: 38 IU/L — AB (ref 0–32)
AST: 34 IU/L (ref 0–40)
Albumin/Globulin Ratio: 1.8 (ref 1.1–2.5)
Albumin: 4.3 g/dL (ref 3.5–5.5)
BILIRUBIN TOTAL: 0.6 mg/dL (ref 0.0–1.2)
BUN/Creatinine Ratio: 14 (ref 9–23)
BUN: 9 mg/dL (ref 6–24)
CHLORIDE: 101 mmol/L (ref 96–106)
CO2: 26 mmol/L (ref 18–29)
Calcium: 9.1 mg/dL (ref 8.7–10.2)
Creatinine, Ser: 0.65 mg/dL (ref 0.57–1.00)
GFR calc Af Amer: 116 mL/min/{1.73_m2} (ref >59)
GFR calc non Af Amer: 100 mL/min/{1.73_m2} (ref >59)
GLUCOSE: 99 mg/dL (ref 65–99)
Globulin, Total: 2.4 g/dL (ref 1.5–4.5)
Potassium: 3.9 mmol/L (ref 3.5–5.2)
Sodium: 141 mmol/L (ref 134–144)
Total Protein: 6.7 g/dL (ref 6.0–8.5)

## 2015-01-22 LAB — TSH: TSH: 3.14 u[IU]/mL (ref 0.450–4.500)

## 2015-01-22 LAB — CBC WITH DIFFERENTIAL/PLATELET
Basophils Absolute: 0 10*3/uL (ref 0.0–0.2)
Basos: 0 %
EOS (ABSOLUTE): 0.1 10*3/uL (ref 0.0–0.4)
EOS: 2 %
HEMATOCRIT: 38.8 % (ref 34.0–46.6)
HEMOGLOBIN: 13 g/dL (ref 11.1–15.9)
Immature Grans (Abs): 0 10*3/uL (ref 0.0–0.1)
Immature Granulocytes: 0 %
Lymphocytes Absolute: 1.7 10*3/uL (ref 0.7–3.1)
Lymphs: 21 %
MCH: 30.4 pg (ref 26.6–33.0)
MCHC: 33.5 g/dL (ref 31.5–35.7)
MCV: 91 fL (ref 79–97)
MONOCYTES: 6 %
MONOS ABS: 0.4 10*3/uL (ref 0.1–0.9)
NEUTROS PCT: 71 %
Neutrophils Absolute: 5.6 10*3/uL (ref 1.4–7.0)
Platelets: 214 10*3/uL (ref 150–379)
RBC: 4.28 x10E6/uL (ref 3.77–5.28)
RDW: 14.3 % (ref 12.3–15.4)
WBC: 7.9 10*3/uL (ref 3.4–10.8)

## 2015-01-22 LAB — HEPATITIS C ANTIBODY

## 2015-01-23 ENCOUNTER — Ambulatory Visit
Admission: EM | Admit: 2015-01-23 | Discharge: 2015-01-23 | Disposition: A | Discharge status: Home / Self Care | Payer: No Typology Code available for payment source | Attending: Family Medicine | Admitting: Family Medicine

## 2015-01-23 DIAGNOSIS — J0100 Acute maxillary sinusitis, unspecified: Secondary | ICD-10-CM | POA: Diagnosis not present

## 2015-01-23 MED ORDER — AMOXICILLIN-POT CLAVULANATE 875-125 MG PO TABS: 1.0000 | ORAL_TABLET | Freq: Two times a day (BID) | ORAL | Status: DC

## 2015-01-23 NOTE — Discharge Instructions (Signed)

## 2015-01-23 NOTE — ED Notes (Signed)
Dry cough x 3 days. Pt has h/o recurrent sinus infections, as well as bronchitis. Pt also c/o H/A, facial pain, chills.

## 2015-01-23 NOTE — ED Provider Notes (Signed)
[facility]  ____________________________________________  Time seen: Approximately 9:39 AM  I have reviewed the triage vital signs and the nursing notes.   HISTORY  Chief Complaint Cough   HPI Gabrielle Tucker is a 55 y.o. female  presents for the complaints of runny nose, nasal congestion, sinus pressure and intermittent cough. Patient states sinus pressure is 4 out of 10 aching to right forehead and right cheek. Patient reports chronic history of sinus infections and this feels similar. Patient reports symptoms of an present primarily for 3 days. Patient does report having some nasal congestion and runny nose over the last week but states symptoms increase over the last 3 days. Patient states approximate 2 weeks ago she had a sinus infection that was treated with [facility], reports that she is concerned that she never fully resolved.  Denies chest pain, shortness of breath, fevers, dizziness, weakness, vomiting, diarrhea or abdominal pain. Reports continues to eat and drink well.  PCP: Army Melia   No past medical history on file.  Patient Active Problem List   Diagnosis Date Noted  . Chest pain 08/03/2014  . Back muscle spasm 07/03/2014  . H/O peptic ulcer 07/03/2014  . Migraine without aura and responsive to treatment 07/03/2014  . Muscle contraction headache 07/03/2014  . Idiopathic insomnia 07/03/2014  . Tachycardia 07/03/2014    Past Surgical History  Procedure Laterality Date  . Partail knee replacement Right   . Lumbar disc surgery    . Abdominal hysterectomy    . Breast reduction surgery    . Lithotripsy  2012    with setnet  . Nasal sinus surgery  2015  . Esophagogastroduodenoscopy  2011    gastric ulcer    Current Outpatient Rx  Name  Route  Sig  Dispense  Refill  . atenolol (TENORMIN) 50 MG tablet   Oral   Take 1 tablet by mouth daily.         . fluticasone (FLONASE) 50 MCG/ACT nasal spray   Each Nare   Place 2 sprays into both nostrils  daily.   16 g   3   . pantoprazole (PROTONIX) 40 MG tablet   Oral   Take 1 tablet by mouth 2 (two) times daily.         Marland Kitchen tiZANidine (ZANAFLEX) 4 MG tablet   Oral   Take 1 tablet (4 mg total) by mouth daily as needed.   30 tablet   5   .           .             Allergies Review of patient's allergies indicates no known allergies.  Family History  Problem Relation Age of Onset  . Stomach cancer Father   . Diabetes Brother     Social History Social History  Substance Use Topics  . Smoking status: Never Smoker   . Smokeless tobacco: None  . Alcohol Use: 1.2 oz/week    2 Standard drinks or equivalent per week    Review of Systems Constitutional: No fever/chills Eyes: No visual changes. ENT: Positive runny nose, nasal congestion, sinus pressure and intermittent sore throat. Positive intermittent cough. Cardiovascular: Denies chest pain. Respiratory: Denies shortness of breath. Gastrointestinal: No abdominal pain.  No nausea, no vomiting.  No diarrhea.  No constipation. Genitourinary: Negative for dysuria. Musculoskeletal: Negative for back pain. Skin: Negative for rash. Neurological: Negative for headaches, focal weakness or numbness.  10-point ROS otherwise negative.  ____________________________________________   PHYSICAL EXAM:  VITAL  SIGNS: ED Triage Vitals  Enc Vitals Group     BP 01/23/15 0917 126/73 mmHg     Pulse Rate 01/23/15 0917 74     Resp 01/23/15 0917 16     Temp 01/23/15 0917 98.1 F (36.7 C)     Temp Source 01/23/15 0917 Oral     SpO2 01/23/15 0917 96 %     Weight 01/23/15 0917 224 lb (101.606 kg)     Height 01/23/15 0917 5\' 6"  (1.676 m)     Head Cir --      Peak Flow --      Pain Score 01/23/15 0919 3     Pain Loc --      Pain Edu? --      Excl. in [place]? --     Constitutional: Alert and oriented. Well appearing and in no acute distress. Eyes: Conjunctivae are normal. PERRL. EOMI. Head: Atraumatic. Mild to moderate tenderness to  palpation bilateral maxillary sinuses and right frontal sinus. No swelling. No erythema.  Ears: no erythema, normal TMs bilaterally.   Nose:nasal congestion with bilateral nasal turbinate erythema. No active nosebleed.  Mouth/Throat: Mucous membranes are moist.  Oropharynx non-erythematous. No tonsillar swelling or exudate. Neck: No stridor.  No cervical spine tenderness to palpation. Hematological/Lymphatic/Immunilogical: No cervical lymphadenopathy. Cardiovascular: Normal rate, regular rhythm. Grossly normal heart sounds.  Good peripheral circulation. Respiratory: Normal respiratory effort.  No retractions. Lungs CTAB. dry intermittent cough. No wheezes, rales or rhonchi.  Gastrointestinal: Soft and nontender.  Musculoskeletal: No lower or upper extremity tenderness nor edema.  Neurologic:  Normal speech and language. No gross focal neurologic deficits are appreciated. No gait instability. Skin:  Skin is warm, dry and intact. No rash noted. Psychiatric: Mood and affect are normal. Speech and behavior are normal.  ____________________________________________   LABS (all labs ordered are listed, but only abnormal results are displayed)  Labs Reviewed - No data to display  INITIAL IMPRESSION / ASSESSMENT AND PLAN / ED COURSE  Pertinent labs & imaging results that were available during my care of the patient were reviewed by me and considered in my medical decision making (see chart for details).  Very well-appearing patient. No acute distress. Presents for complaint of runny nose, nasal congestion, sinus pressure discomfort. Reports chronic history of recurrent sinus infections and reports this fells similar. Patient states symptoms have been primarily with 3 days but also with some runny nose and nasal congestion over the last week. States most recently treated with [facility] for sinus infection 2 weeks ago and states concerned that she was not fully treated. Lungs clear throughout. Abdomen  soft and nontender. Maxillary as well as right frontal sinus tenderness with nasal drainage and nasal turbinate erythema. Will treat maxillary sinusitis with oral Augmentin and supportive treatments including rest, fluids, over-the-counter Mucinex as needed. PCP follow-up.  Discussed follow up with Primary care physician this week. Discussed follow up and return parameters including no resolution or any worsening concerns. Patient verbalized understanding and agreed to plan.   ____________________________________________   FINAL CLINICAL IMPRESSION(S) / ED DIAGNOSES  Final diagnoses:  Acute maxillary sinusitis, recurrence not specified       Marylene Land, NP 01/23/15 [place], NP 01/23/15 [phone]

## 2015-01-25 ENCOUNTER — Telehealth: Payer: Self-pay

## 2015-01-25 NOTE — Telephone Encounter (Signed)
I can't e-scribe anything.  You can call in Robitussin with Codeine.  240cc - 10 cc po tid prn cough.

## 2015-01-25 NOTE — Telephone Encounter (Signed)
Gabrielle Tucker went to [facility] on Sunday, got antibiotic for head and chest congestion, wants to know if you will send her something for cough.dr

## 2015-02-03 ENCOUNTER — Ambulatory Visit
Admission: RE | Admit: 2015-02-03 | Discharge: 2015-02-03 | Disposition: A | Discharge status: Home / Self Care | Payer: No Typology Code available for payment source | Source: Ambulatory Visit | Attending: Internal Medicine | Admitting: Internal Medicine

## 2015-02-03 DIAGNOSIS — Z1231 Encounter for screening mammogram for malignant neoplasm of breast: Secondary | ICD-10-CM | POA: Diagnosis not present

## 2015-02-03 DIAGNOSIS — Z1239 Encounter for other screening for malignant neoplasm of breast: Secondary | ICD-10-CM

## 2015-02-08 ENCOUNTER — Other Ambulatory Visit: Payer: Self-pay | Admitting: Internal Medicine

## 2015-04-08 ENCOUNTER — Other Ambulatory Visit: Payer: Self-pay | Admitting: Internal Medicine

## 2015-08-30 ENCOUNTER — Ambulatory Visit (INDEPENDENT_AMBULATORY_CARE_PROVIDER_SITE_OTHER): Payer: No Typology Code available for payment source | Admitting: Family Medicine

## 2015-08-30 ENCOUNTER — Encounter: Payer: Self-pay | Admitting: Family Medicine

## 2015-08-30 VITALS — BP 130/80 | HR 60 | Ht 66.0 in | Wt 222.0 lb

## 2015-08-30 DIAGNOSIS — N3090 Cystitis, unspecified without hematuria: Secondary | ICD-10-CM

## 2015-08-30 LAB — POCT URINALYSIS DIPSTICK
BILIRUBIN UA: NEGATIVE
Glucose, UA: NEGATIVE
KETONES UA: NEGATIVE
LEUKOCYTES UA: NEGATIVE
Nitrite, UA: POSITIVE
PH UA: 7
Protein, UA: NEGATIVE
RBC UA: NEGATIVE
Spec Grav, UA: 1.015
Urobilinogen, UA: 0.2

## 2015-08-30 MED ORDER — SULFAMETHOXAZOLE-TRIMETHOPRIM 800-160 MG PO TABS: 1.0000 | ORAL_TABLET | Freq: Two times a day (BID) | ORAL | 0 refills | Status: DC

## 2015-08-30 MED ORDER — FLUCONAZOLE 150 MG PO TABS: 150.0000 mg | ORAL_TABLET | Freq: Once | ORAL | 0 refills | Status: AC

## 2015-08-30 NOTE — Progress Notes (Signed)
Name: Gabrielle Tucker   MRN: [id number]    DOB: 1960-05-28   Date:08/30/2015       Progress Note  Subjective  Chief Complaint  Chief Complaint  Patient presents with  . Urinary Tract Infection    having pressure when urinates- taken AZO x 2 days- going to [facility] frequently    Urinary Tract Infection   This is a new problem. The current episode started 1 to 4 weeks ago. The problem occurs every urination. The problem has been gradually worsening. Quality: pressure/suprapubic. There has been no fever. She is sexually active. Associated symptoms include frequency and urgency. Pertinent negatives include no chills, discharge, flank pain, hematuria, hesitancy, nausea, sweats or vomiting. Treatments tried: azo/fluids. There is no history of recurrent UTIs.    No problem-specific Assessment & Plan notes found for this encounter.   No past medical history on file.  Past Surgical History:  Procedure Laterality Date  . ABDOMINAL HYSTERECTOMY    . BREAST REDUCTION SURGERY    . ESOPHAGOGASTRODUODENOSCOPY  2011   gastric ulcer  . LITHOTRIPSY  2012   with setnet  . LUMBAR DISC SURGERY    . NASAL SINUS SURGERY  2015  . partail knee replacement Right   . REDUCTION MAMMAPLASTY Bilateral 2011    Family History  Problem Relation Age of Onset  . Stomach cancer Father   . Diabetes Brother     Social History   Social History  . Marital status: Divorced    Spouse name: N/A  . Number of children: N/A  . Years of education: N/A   Occupational History  . Not on file.   Social History Main Topics  . Smoking status: Never Smoker  . Smokeless tobacco: Never Used  . Alcohol use 1.2 oz/week    2 Standard drinks or equivalent per week  . Drug use: Unknown  . Sexual activity: Not on file   Other Topics Concern  . Not on file   Social History Narrative  . No narrative on file    No Known Allergies   Review of Systems  Constitutional: Negative for chills, fever, malaise/fatigue and  weight loss.  HENT: Negative for ear discharge, ear pain and sore throat.   Eyes: Negative for blurred vision.  Respiratory: Negative for cough, sputum production, shortness of breath and wheezing.   Cardiovascular: Negative for chest pain, palpitations and leg swelling.  Gastrointestinal: Negative for abdominal pain, blood in stool, constipation, diarrhea, heartburn, melena, nausea and vomiting.  Genitourinary: Positive for frequency and urgency. Negative for dysuria, flank pain, hematuria and hesitancy.  Musculoskeletal: Negative for back pain, joint pain, myalgias and neck pain.  Skin: Negative for rash.  Neurological: Negative for dizziness, tingling, sensory change, focal weakness and headaches.  Endo/Heme/Allergies: Negative for environmental allergies and polydipsia. Does not bruise/bleed easily.  Psychiatric/Behavioral: Negative for depression and suicidal ideas. The patient is not nervous/anxious and does not have insomnia.      Objective  Vitals:   08/30/15 1119  BP: 130/80  Pulse: 60  Weight: 222 lb (100.7 kg)  Height: 5\' 6"  (1.676 m)    Physical Exam  Constitutional: She is well-developed, well-nourished, and in no distress. No distress.  HENT:  Head: Normocephalic and atraumatic.  Right Ear: External ear normal.  Left Ear: External ear normal.  Nose: Nose normal.  Mouth/Throat: Oropharynx is clear and moist.  Eyes: Conjunctivae and EOM are normal. Pupils are equal, round, and reactive to light. Right eye exhibits no discharge. Left eye  exhibits no discharge.  Neck: Normal range of motion. Neck supple. No JVD present. No thyromegaly present.  Cardiovascular: Normal rate, regular rhythm, normal heart sounds and intact distal pulses.  Exam reveals no gallop and no friction rub.   No murmur heard. Pulmonary/Chest: Effort normal and breath sounds normal.  Abdominal: Soft. Bowel sounds are normal. She exhibits no mass. There is no tenderness. There is no guarding.   Musculoskeletal: Normal range of motion. She exhibits no edema.  Lymphadenopathy:    She has no cervical adenopathy.  Neurological: She is alert.  Skin: Skin is warm and dry. She is not diaphoretic.  Psychiatric: Mood and affect normal.  Nursing note and vitals reviewed.     Assessment & Plan  Problem List Items Addressed This Visit    None    Visit Diagnoses    Cystitis    -  Primary   Relevant Medications   sulfamethoxazole-trimethoprim (BACTRIM DS,SEPTRA DS) 800-160 MG tablet   fluconazole (DIFLUCAN) 150 MG tablet   Other Relevant Orders   POCT urinalysis dipstick (Completed)        Dr. [facility]  08/30/15

## 2015-09-08 ENCOUNTER — Other Ambulatory Visit: Payer: Self-pay

## 2015-09-08 DIAGNOSIS — N3090 Cystitis, unspecified without hematuria: Secondary | ICD-10-CM

## 2015-09-08 MED ORDER — SULFAMETHOXAZOLE-TRIMETHOPRIM 800-160 MG PO TABS: 1.0000 | ORAL_TABLET | Freq: Two times a day (BID) | ORAL | 0 refills | Status: DC

## 2015-09-09 ENCOUNTER — Other Ambulatory Visit: Payer: Self-pay | Admitting: Internal Medicine

## 2015-09-09 MED ORDER — METOPROLOL SUCCINATE ER 50 MG PO TB24: 50.0000 mg | ORAL_TABLET | Freq: Every day | ORAL | 5 refills | Status: DC

## 2015-10-06 ENCOUNTER — Other Ambulatory Visit: Payer: Self-pay | Admitting: Internal Medicine

## 2015-10-06 MED ORDER — PANTOPRAZOLE SODIUM 40 MG PO TBEC: 40.0000 mg | DELAYED_RELEASE_TABLET | Freq: Two times a day (BID) | ORAL | 5 refills | Status: DC

## 2015-10-11 ENCOUNTER — Encounter: Payer: Self-pay | Admitting: Internal Medicine

## 2015-10-11 ENCOUNTER — Ambulatory Visit (INDEPENDENT_AMBULATORY_CARE_PROVIDER_SITE_OTHER): Payer: No Typology Code available for payment source | Admitting: Internal Medicine

## 2015-10-11 VITALS — BP 122/82 | HR 87 | Resp 16 | Ht 66.0 in | Wt 223.0 lb

## 2015-10-11 DIAGNOSIS — R300 Dysuria: Secondary | ICD-10-CM | POA: Diagnosis not present

## 2015-10-11 LAB — POC URINALYSIS WITH MICROSCOPIC (NON AUTO)MANUAL RESULT
Bilirubin, UA: NEGATIVE
Blood, UA: NEGATIVE
Glucose, UA: NEGATIVE
Ketones, UA: NEGATIVE
LEUKOCYTES UA: NEGATIVE
Nitrite, UA: NEGATIVE
PROTEIN UA: NEGATIVE
Urobilinogen, UA: 0.2
pH, UA: 6.5

## 2015-10-11 NOTE — Progress Notes (Signed)
Date:  10/11/2015   Name:  Gabrielle Tucker   DOB:  05/21/1960   MRN:  [id number]   Chief Complaint: Abdominal Pain (Pressure in abdominal area and urinary frequency since last UTI in Aug where Dr.Jones 2 rounds of Abx  )  Abdominal Pain  Pertinent negatives include no arthralgias, fever, frequency or hematuria.  Patient complains of urinary discomfort especially bladder pains when her bladder is full. She has no low back pain fever chills or flank pain. She's had some odor to the urine she believes but no burning with urination and no hematuria. Has a history of one kidney stone treated about 6 years ago with lithotripsy. She is seen about 5 weeks ago with symptoms of UTI treated with Macrobid. Symptoms did seem to improve at that time. Urine dipstick was positive only for nitrites. No culture was performed.   Review of Systems  Constitutional: Negative for chills, fatigue and fever.  Respiratory: Negative for chest tightness and shortness of breath.   Gastrointestinal: Positive for abdominal pain.  Genitourinary: Positive for urgency. Negative for frequency and hematuria.  Musculoskeletal: Negative for arthralgias.    Patient Active Problem List   Diagnosis Date Noted  . Chest pain 08/03/2014  . Back muscle spasm 07/03/2014  . H/O peptic ulcer 07/03/2014  . Migraine without aura and responsive to treatment 07/03/2014  . Muscle contraction headache 07/03/2014  . Idiopathic insomnia 07/03/2014  . Tachycardia 07/03/2014    Prior to Admission medications   Medication Sig Start Date End Date Taking? Authorizing Provider  fluticasone (FLONASE) 50 MCG/ACT nasal spray Place 2 sprays into both nostrils daily. 12/29/14  Yes Glean Hess, MD  metoprolol succinate (TOPROL-XL) 50 MG 24 hr tablet Take 1 tablet (50 mg total) by mouth daily. Take with or immediately following a meal. 09/09/15  Yes Glean Hess, MD  pantoprazole (PROTONIX) 40 MG tablet Take 1 tablet (40 mg total) by  mouth 2 (two) times daily. 10/06/15  Yes Glean Hess, MD  tiZANidine (ZANAFLEX) 4 MG tablet TAKE 1 TABLET BY MOUTH EVERY DAY AS NEEDED 10/06/15  Yes Glean Hess, MD  zaleplon (SONATA) 10 MG capsule TAKE 1 CAPSULE BY MOUTH EVERY DAY AT BEDTIME 04/08/15  Yes Glean Hess, MD    No Known Allergies  Past Surgical History:  Procedure Laterality Date  . ABDOMINAL HYSTERECTOMY    . BREAST REDUCTION SURGERY    . ESOPHAGOGASTRODUODENOSCOPY  2011   gastric ulcer  . LITHOTRIPSY  2012   with setnet  . LUMBAR DISC SURGERY    . NASAL SINUS SURGERY  2015  . partail knee replacement Right   . REDUCTION MAMMAPLASTY Bilateral 2011    Social History  Substance Use Topics  . Smoking status: Never Smoker  . Smokeless tobacco: Never Used  . Alcohol use 1.2 oz/week    2 Standard drinks or equivalent per week     Medication list has been reviewed and updated.   Physical Exam  Constitutional: She is oriented to person, place, and time. She appears well-developed. No distress.  HENT:  Head: Normocephalic and atraumatic.  Cardiovascular: Normal rate, regular rhythm and normal heart sounds.   Pulmonary/Chest: Effort normal and breath sounds normal. No respiratory distress.  Abdominal: Normal appearance and bowel sounds are normal. There is no hepatosplenomegaly. There is tenderness in the suprapubic area. There is no rebound and no CVA tenderness.  Musculoskeletal: Normal range of motion.  Neurological: She is alert and oriented to  person, place, and time.  Skin: Skin is warm and dry. No rash noted.  Psychiatric: She has a normal mood and affect. Her behavior is normal. Thought content normal.  Nursing note and vitals reviewed.  Urine dipstick shows negative for all components.  BP 122/82   Pulse 87   Resp 16   Ht 5\' 6"  (1.676 m)   Wt 223 lb (101.2 kg)   SpO2 98%   BMI 35.99 kg/m   Assessment and Plan: 1. Dysuria Concern for possible IC Increase intake of fluids AZO for 2  days Urology referral if persistent sx - POC urinalysis w microscopic (non auto)   Halina Maidens, MD [facility]  10/11/2015

## 2015-10-11 NOTE — Patient Instructions (Signed)
Drink Lemonade  Take AZO for 2 days  Call for urology referral if needed

## 2015-10-13 ENCOUNTER — Telehealth: Payer: Self-pay

## 2015-10-13 ENCOUNTER — Other Ambulatory Visit: Payer: Self-pay | Admitting: Internal Medicine

## 2015-10-13 DIAGNOSIS — N3090 Cystitis, unspecified without hematuria: Secondary | ICD-10-CM

## 2015-10-13 DIAGNOSIS — R300 Dysuria: Secondary | ICD-10-CM | POA: Insufficient documentation

## 2015-10-13 MED ORDER — ZALEPLON 10 MG PO CAPS: ORAL_CAPSULE | ORAL | 5 refills | Status: DC

## 2015-10-13 NOTE — Telephone Encounter (Signed)
Wants Ref to [facility]  [phone].

## 2015-10-14 ENCOUNTER — Telehealth: Payer: Self-pay

## 2015-10-14 NOTE — Telephone Encounter (Signed)
Patient is calling to get a referral to [facility] in [place]. Please advise

## 2015-10-14 NOTE — Telephone Encounter (Signed)
Referral has been entered and awaiting scheduling. Advised patient to callback if no word by [facility]

## 2015-11-04 ENCOUNTER — Telehealth: Payer: Self-pay | Admitting: Internal Medicine

## 2015-11-04 NOTE — Telephone Encounter (Signed)
Patient stated she is having a burning sensation when using the bathroom and believes her UTI is back. [place] like something called in to [facility] in [place] (pharmacy's phone number is [phone]) Please contact patient back at earliest convince.

## 2015-11-05 ENCOUNTER — Other Ambulatory Visit: Payer: Self-pay | Admitting: Internal Medicine

## 2015-11-05 DIAGNOSIS — N3090 Cystitis, unspecified without hematuria: Secondary | ICD-10-CM

## 2015-11-05 MED ORDER — SULFAMETHOXAZOLE-TRIMETHOPRIM 800-160 MG PO TABS: 1.0000 | ORAL_TABLET | Freq: Two times a day (BID) | ORAL | 0 refills | Status: DC

## 2015-11-05 NOTE — Telephone Encounter (Signed)
Called patient today - did not see message yesterday due to need to leave on a personal emergency.  She is having dysuria and pressure, not relieved by AZO.  She plans to return to [facility] tomorrow and call on Monday to follow up with Urology. Prescription for Bactrim sent in the [facility] for her to start tomorrow.

## 2015-11-08 ENCOUNTER — Telehealth: Payer: Self-pay | Admitting: Internal Medicine

## 2015-11-08 ENCOUNTER — Other Ambulatory Visit: Payer: Self-pay | Admitting: Internal Medicine

## 2015-11-08 MED ORDER — TIZANIDINE HCL 4 MG PO TABS: 4.0000 mg | ORAL_TABLET | Freq: Every day | ORAL | 5 refills | Status: DC | PRN

## 2015-11-08 NOTE — Telephone Encounter (Signed)
Pt called said she is out of her Zanaflex 61m pt is using [facility] in [place].please advise.

## 2015-11-08 NOTE — Telephone Encounter (Signed)
Rx sent 

## 2015-11-29 ENCOUNTER — Ambulatory Visit (INDEPENDENT_AMBULATORY_CARE_PROVIDER_SITE_OTHER): Payer: No Typology Code available for payment source

## 2015-11-29 ENCOUNTER — Ambulatory Visit (INDEPENDENT_AMBULATORY_CARE_PROVIDER_SITE_OTHER): Payer: No Typology Code available for payment source | Admitting: Podiatry

## 2015-11-29 VITALS — BP 138/81 | HR 81 | Temp 97.6°F | Resp 16 | Ht 66.0 in | Wt 225.0 lb

## 2015-11-29 DIAGNOSIS — M79672 Pain in left foot: Secondary | ICD-10-CM

## 2015-11-29 DIAGNOSIS — M7752 Other enthesopathy of left foot: Secondary | ICD-10-CM

## 2015-11-29 DIAGNOSIS — R600 Localized edema: Secondary | ICD-10-CM | POA: Diagnosis not present

## 2015-11-29 DIAGNOSIS — R52 Pain, unspecified: Secondary | ICD-10-CM | POA: Diagnosis not present

## 2015-11-29 MED ORDER — NONFORMULARY OR COMPOUNDED ITEM: 1.0000 g | Freq: Four times a day (QID) | 2 refills | Status: DC

## 2015-11-29 NOTE — Progress Notes (Signed)
   Subjective:    Patient ID: Gabrielle Tucker, female    DOB: [birth year], 55 y.o.   MRN: [id number]  HPI    Review of Systems  HENT: Positive for sinus pressure.   All other systems reviewed and are negative.      Objective:   Physical Exam        Assessment & Plan:

## 2015-12-04 MED ORDER — BETAMETHASONE SOD PHOS & ACET 6 (3-3) MG/ML IJ SUSP: 3.0000 mg | Freq: Once | INTRAMUSCULAR | Status: DC

## 2015-12-04 NOTE — Progress Notes (Signed)
Patient ID: Francisca December, female   DOB: 1961-01-05, 55 y.o.   MRN: [id number] Subjective:  Patient presents today for evaluation of pain to the left forefoot. Patient states that the pain is very significant on the ball of her left foot. Pain is been ongoing for 3-4 months now. Patient presents today for further treatment and evaluation    Objective/Physical Exam General: The patient is alert and oriented x3 in no acute distress.  Dermatology: Skin is warm, dry and supple bilateral lower extremities. Negative for open lesions or macerations.  Vascular: Palpable pedal pulses bilaterally. No edema or erythema noted. Capillary refill within normal limits.  Neurological: Epicritic and protective threshold grossly intact bilaterally.   Musculoskeletal Exam: Pain on palpation and range of motion to the second and third MPJs of the left foot system with a capsulitis. Mild edema noted.  Radiographic Exam:  Normal osseous mineralization. Joint spaces preserved. No fracture/dislocation/boney destruction.    Assessment: #1 capsulitis second MPJ left foot #2 capsulitis third MPJ left foot #3 localized edema left foot #4 pain in left foot   Plan of Care:  #1 Patient was evaluated. #2 injection of 0.5 mL Celestone Soluspan injected into the second MPJ left foot #3 injection of 0.5 mL Celestone Soluspan injected in the third MPJ left foot #4 prescription for anti-plantar pain cream through [facility] #5 recommend offloading metatarsal pads  #6 recommend stiff soled shoes and the toe box to assist in immobilization of the MPJs  #7 return to clinic in 4 weeks   Dr. Edrick Kins, [place]

## 2015-12-21 ENCOUNTER — Ambulatory Visit (INDEPENDENT_AMBULATORY_CARE_PROVIDER_SITE_OTHER): Payer: No Typology Code available for payment source | Admitting: Internal Medicine

## 2015-12-21 ENCOUNTER — Encounter: Payer: Self-pay | Admitting: Internal Medicine

## 2015-12-21 VITALS — BP 122/78 | HR 76 | Temp 98.1°F | Resp 16 | Ht 66.0 in | Wt 224.0 lb

## 2015-12-21 DIAGNOSIS — J0100 Acute maxillary sinusitis, unspecified: Secondary | ICD-10-CM

## 2015-12-21 DIAGNOSIS — Z8711 Personal history of peptic ulcer disease: Secondary | ICD-10-CM

## 2015-12-21 MED ORDER — PANTOPRAZOLE SODIUM 40 MG PO TBEC: 40.0000 mg | DELAYED_RELEASE_TABLET | Freq: Two times a day (BID) | ORAL | 5 refills | Status: DC

## 2015-12-21 MED ORDER — GUAIFENESIN-CODEINE 100-10 MG/5ML PO SYRP: 5.0000 mL | ORAL_SOLUTION | Freq: Three times a day (TID) | ORAL | 0 refills | Status: DC | PRN

## 2015-12-21 MED ORDER — AMOXICILLIN-POT CLAVULANATE 875-125 MG PO TABS: 1.0000 | ORAL_TABLET | Freq: Two times a day (BID) | ORAL | 0 refills | Status: DC

## 2015-12-21 NOTE — Progress Notes (Signed)
Date:  12/21/2015   Name:  Gabrielle Tucker   DOB:  [birth year]   MRN:  [id number]   Chief Complaint: Sinus Problem (sunday started with sore throat and then chills and sinus pain and pressure. ) Sinus Problem  This is a new problem. The current episode started in the past 7 days. The problem has been gradually worsening since onset. There has been no fever. Associated symptoms include chills, congestion, coughing, ear pain, sinus pressure and a sore throat. Pertinent negatives include no headaches, hoarse voice or shortness of breath. Past treatments include oral decongestants and saline sprays. The treatment provided mild relief.      Review of Systems  Constitutional: Positive for chills.  HENT: Positive for congestion, ear pain, postnasal drip, sinus pressure and sore throat. Negative for hoarse voice, trouble swallowing and voice change.   Eyes: Negative for visual disturbance.  Respiratory: Positive for cough. Negative for chest tightness, shortness of breath and wheezing.   Cardiovascular: Negative for chest pain.  Neurological: Negative for dizziness and headaches.    Patient Active Problem List   Diagnosis Date Noted  . Dysuria 10/13/2015  . Chest pain 08/03/2014  . Back muscle spasm 07/03/2014  . H/O peptic ulcer 07/03/2014  . Migraine without aura and responsive to treatment 07/03/2014  . Muscle contraction headache 07/03/2014  . Idiopathic insomnia 07/03/2014  . Tachycardia 07/03/2014    Prior to Admission medications   Medication Sig Start Date End Date Taking? Authorizing Provider  amitriptyline (ELAVIL) 25 MG tablet Take 25 mg by mouth. 11/10/15  Yes Historical Provider, MD  fluticasone (FLONASE) 50 MCG/ACT nasal spray Place into the nose. 12/29/14  Yes Historical Provider, MD  metoprolol succinate (TOPROL-XL) 50 MG 24 hr tablet Take 50 mg by mouth. 09/09/15  Yes Historical Provider, MD  NONFORMULARY OR COMPOUNDED ITEM Apply 1-2 g topically 4 (four) times  daily. 11/29/15  Yes Edrick Kins, DPM  pantoprazole (PROTONIX) 40 MG tablet Take 1 tablet (40 mg total) by mouth 2 (two) times daily. 10/06/15  Yes Glean Hess, MD  tiZANidine (ZANAFLEX) 4 MG tablet Take 1 tablet (4 mg total) by mouth daily as needed. 11/08/15  Yes Glean Hess, MD  zaleplon (SONATA) 10 MG capsule TAKE 1 CAPSULE BY MOUTH EVERY DAY AT BEDTIME 10/13/15  Yes Glean Hess, MD    No Known Allergies  Past Surgical History:  Procedure Laterality Date  . ABDOMINAL HYSTERECTOMY    . BREAST REDUCTION SURGERY    . ESOPHAGOGASTRODUODENOSCOPY  2011   gastric ulcer  . LITHOTRIPSY  2012   with setnet  . LUMBAR DISC SURGERY    . NASAL SINUS SURGERY  2015  . partail knee replacement Right   . REDUCTION MAMMAPLASTY Bilateral 2011    Social History  Substance Use Topics  . Smoking status: Never Smoker  . Smokeless tobacco: Never Used  . Alcohol use 1.2 oz/week    2 Standard drinks or equivalent per week     Medication list has been reviewed and updated.   Physical Exam  Constitutional: She is oriented to person, place, and time. She appears well-developed and well-nourished.  HENT:  Right Ear: External ear and ear canal normal. Tympanic membrane is retracted. Tympanic membrane is not erythematous.  Left Ear: External ear and ear canal normal. Tympanic membrane is retracted. Tympanic membrane is not erythematous.  Nose: Right sinus exhibits maxillary sinus tenderness and frontal sinus tenderness. Left sinus exhibits no maxillary sinus tenderness and  no frontal sinus tenderness.  Mouth/Throat: Uvula is midline and mucous membranes are normal. No oral lesions. Posterior oropharyngeal erythema present. No oropharyngeal exudate.  Cardiovascular: Normal rate, regular rhythm and normal heart sounds.   Pulmonary/Chest: Breath sounds normal. She has no wheezes. She has no rales.  Lymphadenopathy:    She has no cervical adenopathy.  Neurological: She is alert and oriented  to person, place, and time.    BP 122/78   Pulse 76   Temp 98.1 F (36.7 C)   Resp 16   Ht 5\' 6"  (1.676 m)   Wt 224 lb (101.6 kg)   SpO2 97%   BMI 36.15 kg/m   Assessment and Plan: 1. Acute non-recurrent maxillary sinusitis Continue Flonase, Mucinex-D and Sinus rinses - amoxicillin-clavulanate (AUGMENTIN) 875-125 MG tablet; Take 1 tablet by mouth 2 (two) times daily.  Dispense: 20 tablet; Refill: 0 - guaiFENesin-codeine (ROBITUSSIN AC) 100-10 MG/5ML syrup; Take 5 mLs by mouth 3 (three) times daily as needed for cough.  Dispense: 150 mL; Refill: 0  2. H/O peptic ulcer - pantoprazole (PROTONIX) 40 MG tablet; Take 1 tablet (40 mg total) by mouth 2 (two) times daily.  Dispense: 60 tablet; Refill: [place], MD [facility]  12/21/2015

## 2015-12-30 ENCOUNTER — Ambulatory Visit: Payer: No Typology Code available for payment source | Admitting: Podiatry

## 2016-01-03 ENCOUNTER — Telehealth: Payer: Self-pay

## 2016-01-03 ENCOUNTER — Other Ambulatory Visit: Payer: Self-pay | Admitting: Internal Medicine

## 2016-01-03 MED ORDER — FLUCONAZOLE 100 MG PO TABS: 100.0000 mg | ORAL_TABLET | Freq: Every day | ORAL | 0 refills | Status: DC

## 2016-01-03 NOTE — Telephone Encounter (Signed)
Called pt to let her know Dr. Army Melia send Rx Diflucan to her pharmacy.

## 2016-01-03 NOTE — Telephone Encounter (Signed)
Let patient know that I sent Diflucan to the pharmacy.

## 2016-01-03 NOTE — Telephone Encounter (Signed)
Has yeast infection now from Augmentin and OTC not helping. Please call patient if you send something in.

## 2016-01-18 ENCOUNTER — Telehealth: Payer: Self-pay | Admitting: *Deleted

## 2016-01-18 NOTE — Telephone Encounter (Signed)
I called patient to make her another appt for being sick since its been a month since her last visit, pt stated she called Friday and never had a return call, I called patient to make a sick visit appt,for today,  pt stated she was not coming in for another appt to be seen.

## 2016-01-25 ENCOUNTER — Encounter: Payer: Self-pay | Admitting: Internal Medicine

## 2016-02-02 ENCOUNTER — Encounter: Payer: Self-pay | Admitting: Internal Medicine

## 2016-02-02 ENCOUNTER — Ambulatory Visit (INDEPENDENT_AMBULATORY_CARE_PROVIDER_SITE_OTHER): Payer: 59 | Admitting: Internal Medicine

## 2016-02-02 VITALS — BP 118/78 | HR 82 | Temp 98.1°F | Wt 224.0 lb

## 2016-02-02 DIAGNOSIS — J0101 Acute recurrent maxillary sinusitis: Secondary | ICD-10-CM | POA: Diagnosis not present

## 2016-02-02 MED ORDER — FLUCONAZOLE 100 MG PO TABS: 100.0000 mg | ORAL_TABLET | Freq: Every day | ORAL | 0 refills | Status: DC

## 2016-02-02 MED ORDER — FLUTICASONE PROPIONATE 50 MCG/ACT NA SUSP: 2.0000 | Freq: Every day | NASAL | 5 refills | Status: DC

## 2016-02-02 MED ORDER — SULFAMETHOXAZOLE-TRIMETHOPRIM 800-160 MG PO TABS: 1.0000 | ORAL_TABLET | Freq: Two times a day (BID) | ORAL | 0 refills | Status: DC

## 2016-02-02 MED ORDER — PREDNISONE 10 MG PO TABS: ORAL_TABLET | ORAL | 0 refills | Status: DC

## 2016-02-02 NOTE — Progress Notes (Signed)
Date:  02/02/2016   Name:  Gabrielle Tucker   DOB:  09/23/1960   MRN:  [id number]   Chief Complaint: Sinus Problem Sinus Problem  This is a recurrent problem. The current episode started more than 1 month ago. The problem has been gradually worsening since onset. There has been no fever. Associated symptoms include congestion, coughing, ear pain and sinus pressure. Pertinent negatives include no chills, headaches, shortness of breath or swollen glands. Past treatments include oral decongestants, saline sprays and antibiotics. The treatment provided moderate (but not lasting) relief.      Review of Systems  Constitutional: Negative for chills, fatigue and fever.  HENT: Positive for congestion, ear pain and sinus pressure. Negative for tinnitus and trouble swallowing.   Respiratory: Positive for cough. Negative for chest tightness, shortness of breath and wheezing.   Cardiovascular: Negative for chest pain.  Neurological: Negative for headaches.    Patient Active Problem List   Diagnosis Date Noted  . Dysuria 10/13/2015  . Chest pain 08/03/2014  . Back muscle spasm 07/03/2014  . H/O peptic ulcer 07/03/2014  . Migraine without aura and responsive to treatment 07/03/2014  . Muscle contraction headache 07/03/2014  . Idiopathic insomnia 07/03/2014  . Tachycardia 07/03/2014    Prior to Admission medications   Medication Sig Start Date End Date Taking? Authorizing Provider  amitriptyline (ELAVIL) 25 MG tablet Take 25 mg by mouth. 11/10/15  Yes Historical Provider, MD  fluticasone (FLONASE) 50 MCG/ACT nasal spray Place 2 sprays into both nostrils daily. 02/02/16  Yes Glean Hess, MD  metoprolol succinate (TOPROL-XL) 50 MG 24 hr tablet Take 50 mg by mouth. 09/09/15  Yes Historical Provider, MD  pantoprazole (PROTONIX) 40 MG tablet Take 1 tablet (40 mg total) by mouth 2 (two) times daily. 12/21/15  Yes Glean Hess, MD  tiZANidine (ZANAFLEX) 4 MG tablet Take 1 tablet (4 mg  total) by mouth daily as needed. 11/08/15  Yes Glean Hess, MD  zaleplon (SONATA) 10 MG capsule TAKE 1 CAPSULE BY MOUTH EVERY DAY AT BEDTIME 10/13/15  Yes Glean Hess, MD  fluconazole (DIFLUCAN) 100 MG tablet Take 1 tablet (100 mg total) by mouth daily. 02/02/16   Glean Hess, MD  predniSONE (DELTASONE) 10 MG tablet Take 6 on day 1, 5 on day 2, 4 on day 3, 3 on day 4, 2 on day 5 and 1 on day 1 then stop. 02/02/16   Glean Hess, MD  sulfamethoxazole-trimethoprim (BACTRIM DS,SEPTRA DS) 800-160 MG tablet Take 1 tablet by mouth 2 (two) times daily. 02/02/16   Glean Hess, MD    No Known Allergies  Past Surgical History:  Procedure Laterality Date  . ABDOMINAL HYSTERECTOMY    . BREAST REDUCTION SURGERY    . ESOPHAGOGASTRODUODENOSCOPY  2011   gastric ulcer  . LITHOTRIPSY  2012   with setnet  . LUMBAR DISC SURGERY    . NASAL SINUS SURGERY  2015  . partail knee replacement Right   . REDUCTION MAMMAPLASTY Bilateral 2011    Social History  Substance Use Topics  . Smoking status: Never Smoker  . Smokeless tobacco: Never Used  . Alcohol use 1.2 oz/week    2 Standard drinks or equivalent per week     Medication list has been reviewed and updated.   Physical Exam  Constitutional: She is oriented to person, place, and time. She appears well-developed and well-nourished.  HENT:  Right Ear: External ear and ear canal normal. Tympanic  membrane is erythematous and retracted.  Left Ear: External ear and ear canal normal. Tympanic membrane is retracted. Tympanic membrane is not erythematous.  Nose: Right sinus exhibits maxillary sinus tenderness and frontal sinus tenderness. Left sinus exhibits no maxillary sinus tenderness and no frontal sinus tenderness.  Mouth/Throat: Uvula is midline and mucous membranes are normal. No oral lesions. Posterior oropharyngeal erythema present. No oropharyngeal exudate.  Cardiovascular: Normal rate, regular rhythm and normal heart sounds.     Pulmonary/Chest: Breath sounds normal. She has no wheezes. She has no rales.  Lymphadenopathy:    She has no cervical adenopathy.  Neurological: She is alert and oriented to person, place, and time.    BP 118/78   Pulse 82   Temp 98.1 F (36.7 C)   Wt 224 lb (101.6 kg)   SpO2 98%   BMI 36.15 kg/m   Assessment and Plan: 1. Acute recurrent maxillary sinusitis - sulfamethoxazole-trimethoprim (BACTRIM DS,SEPTRA DS) 800-160 MG tablet; Take 1 tablet by mouth 2 (two) times daily.  Dispense: 28 tablet; Refill: 0 - predniSONE (DELTASONE) 10 MG tablet; Take 6 on day 1, 5 on day 2, 4 on day 3, 3 on day 4, 2 on day 5 and 1 on day 1 then stop.  Dispense: 21 tablet; Refill: 0 - fluconazole (DIFLUCAN) 100 MG tablet; Take 1 tablet (100 mg total) by mouth daily.  Dispense: 3 tablet; Refill: 0 - fluticasone (FLONASE) 50 MCG/ACT nasal spray; Place 2 sprays into both nostrils daily.  Dispense: 16 g; Refill: [place], MD [facility]  02/02/2016

## 2016-02-14 ENCOUNTER — Ambulatory Visit: Payer: 59 | Admitting: Internal Medicine

## 2016-02-28 ENCOUNTER — Encounter: Payer: Self-pay | Admitting: Internal Medicine

## 2016-02-28 ENCOUNTER — Ambulatory Visit (INDEPENDENT_AMBULATORY_CARE_PROVIDER_SITE_OTHER): Payer: 59 | Admitting: Internal Medicine

## 2016-02-28 VITALS — BP 152/92 | HR 90 | Temp 97.8°F | Ht 66.0 in | Wt 227.0 lb

## 2016-02-28 DIAGNOSIS — G43009 Migraine without aura, not intractable, without status migrainosus: Secondary | ICD-10-CM | POA: Diagnosis not present

## 2016-02-28 NOTE — Progress Notes (Signed)
Date:  02/28/2016   Name:  Gabrielle Tucker   DOB:  10-Aug-1960   MRN:  [id number]   Chief Complaint: Headache Headache   This is a new problem. The current episode started in the past 7 days. The problem occurs constantly. The pain is located in the left unilateral region. The pain does not radiate. The pain quality is similar to prior headaches. The quality of the pain is described as aching and squeezing. The pain is moderate. Associated symptoms include nausea and sinus pressure. Pertinent negatives include no dizziness, fever, numbness, vomiting or weakness. She has tried NSAIDs (and zanaflex) for the symptoms. The treatment provided no relief. Her past medical history is significant for migraine headaches.    Review of Systems  Constitutional: Negative for chills, fatigue and fever.  HENT: Positive for sinus pressure. Negative for sinus pain.   Eyes: Negative for visual disturbance.  Respiratory: Negative for chest tightness, shortness of breath and wheezing.   Cardiovascular: Negative for palpitations.  Gastrointestinal: Positive for nausea. Negative for vomiting.  Neurological: Positive for headaches. Negative for dizziness, speech difficulty, weakness, light-headedness and numbness.    Patient Active Problem List   Diagnosis Date Noted  . Dysuria 10/13/2015  . Chest pain 08/03/2014  . Back muscle spasm 07/03/2014  . H/O peptic ulcer 07/03/2014  . Migraine without aura and responsive to treatment 07/03/2014  . Muscle contraction headache 07/03/2014  . Idiopathic insomnia 07/03/2014  . Tachycardia 07/03/2014    Prior to Admission medications   Medication Sig Start Date End Date Taking? Authorizing Provider  amitriptyline (ELAVIL) 25 MG tablet Take 25 mg by mouth. 11/10/15  Yes Historical Provider, MD  fluconazole (DIFLUCAN) 100 MG tablet Take 1 tablet (100 mg total) by mouth daily. 02/02/16  Yes Glean Hess, MD  fluticasone (FLONASE) 50 MCG/ACT nasal spray Place 2  sprays into both nostrils daily. 02/02/16  Yes Glean Hess, MD  metoprolol succinate (TOPROL-XL) 50 MG 24 hr tablet Take 50 mg by mouth. 09/09/15  Yes Historical Provider, MD  pantoprazole (PROTONIX) 40 MG tablet Take 1 tablet (40 mg total) by mouth 2 (two) times daily. 12/21/15  Yes Glean Hess, MD  tiZANidine (ZANAFLEX) 4 MG tablet Take 1 tablet (4 mg total) by mouth daily as needed. 11/08/15  Yes Glean Hess, MD  zaleplon (SONATA) 10 MG capsule TAKE 1 CAPSULE BY MOUTH EVERY DAY AT BEDTIME 10/13/15  Yes Glean Hess, MD    No Known Allergies  Past Surgical History:  Procedure Laterality Date  . ABDOMINAL HYSTERECTOMY    . BREAST REDUCTION SURGERY    . ESOPHAGOGASTRODUODENOSCOPY  2011   gastric ulcer  . LITHOTRIPSY  2012   with setnet  . LUMBAR DISC SURGERY    . NASAL SINUS SURGERY  2015  . partail knee replacement Right   . REDUCTION MAMMAPLASTY Bilateral 2011    Social History  Substance Use Topics  . Smoking status: Never Smoker  . Smokeless tobacco: Never Used  . Alcohol use 1.2 oz/week    2 Standard drinks or equivalent per week     Medication list has been reviewed and updated.   Physical Exam  Constitutional: She is oriented to person, place, and time. She appears well-developed. No distress.  HENT:  Head: Normocephalic and atraumatic.  Neck: Normal range of motion. Neck supple.  Cardiovascular: Normal rate, regular rhythm and normal heart sounds.   Pulmonary/Chest: Effort normal and breath sounds normal. No respiratory distress.  Musculoskeletal: Normal range of motion. She exhibits no edema.  Neurological: She is alert and oriented to person, place, and time.  Skin: Skin is warm and dry. No rash noted.  Psychiatric: She has a normal mood and affect. Her behavior is normal. Thought content normal.  Nursing note and vitals reviewed.   BP (!) 152/92   Pulse 90   Temp 97.8 F (36.6 C)   Ht 5\' 6"  (1.676 m)   Wt 227 lb (103 kg)   SpO2 98%    BMI 36.64 kg/m   Assessment and Plan: 1. Migraine without aura and responsive to treatment Gave samples of Onzetra May need to go to ER if persistent   Halina Maidens, MD [facility]  02/28/2016

## 2016-02-28 NOTE — Patient Instructions (Signed)
Onzetra  - use once, may repeat in 2-4 hours if needed (max dose 2 in 24 hours 1 dose = 2 nose pieces)

## 2016-03-02 ENCOUNTER — Other Ambulatory Visit: Payer: Self-pay | Admitting: Internal Medicine

## 2016-03-02 ENCOUNTER — Telehealth: Payer: Self-pay

## 2016-03-02 MED ORDER — SUMATRIPTAN SUCCINATE 11 MG/NOSEPC NA EXHP: 11.0000 mg | INHALANT_POWDER | Freq: Two times a day (BID) | NASAL | 5 refills | Status: DC | PRN

## 2016-03-02 NOTE — Telephone Encounter (Signed)
Patient is requesting more of the [facility] sent into the pharmacy. [facility] in [place].

## 2016-03-02 NOTE — Telephone Encounter (Signed)
Patient called saying that she was seen in the office on Tuesday (2/6) and was treated for a Migraine. She reports that she was given samples, and she is requesting something to be called in to the pharmacy.

## 2016-03-02 NOTE — Telephone Encounter (Signed)
Left message to call back  

## 2016-03-05 ENCOUNTER — Other Ambulatory Visit: Payer: Self-pay | Admitting: Internal Medicine

## 2016-04-12 ENCOUNTER — Other Ambulatory Visit: Payer: Self-pay | Admitting: Internal Medicine

## 2016-04-13 ENCOUNTER — Other Ambulatory Visit: Payer: Self-pay

## 2016-04-13 MED ORDER — ZALEPLON 10 MG PO CAPS: ORAL_CAPSULE | ORAL | 5 refills | Status: DC

## 2016-05-21 ENCOUNTER — Encounter: Payer: Self-pay | Admitting: Internal Medicine

## 2016-05-21 ENCOUNTER — Other Ambulatory Visit: Payer: Self-pay | Admitting: Internal Medicine

## 2016-05-21 ENCOUNTER — Ambulatory Visit (INDEPENDENT_AMBULATORY_CARE_PROVIDER_SITE_OTHER): Payer: 59 | Admitting: Internal Medicine

## 2016-05-21 VITALS — BP 138/68 | HR 88 | Temp 98.8°F | Ht 66.0 in | Wt 223.4 lb

## 2016-05-21 DIAGNOSIS — J111 Influenza due to unidentified influenza virus with other respiratory manifestations: Secondary | ICD-10-CM

## 2016-05-21 DIAGNOSIS — R69 Illness, unspecified: Secondary | ICD-10-CM

## 2016-05-21 DIAGNOSIS — J0100 Acute maxillary sinusitis, unspecified: Secondary | ICD-10-CM

## 2016-05-21 MED ORDER — GUAIFENESIN-CODEINE 100-10 MG/5ML PO SYRP: 5.0000 mL | ORAL_SOLUTION | Freq: Three times a day (TID) | ORAL | 0 refills | Status: DC | PRN

## 2016-05-21 MED ORDER — OSELTAMIVIR PHOSPHATE 75 MG PO CAPS: 75.0000 mg | ORAL_CAPSULE | Freq: Two times a day (BID) | ORAL | 0 refills | Status: DC

## 2016-05-21 MED ORDER — CODEINE POLT-CHLORPHEN POLT ER 14.7-2.8 MG/5ML PO SUER: 10.0000 mL | Freq: Two times a day (BID) | ORAL | 0 refills | Status: DC | PRN

## 2016-05-21 MED ORDER — HYDROCOD POLST-CPM POLST ER 10-8 MG/5ML PO SUER: 5.0000 mL | Freq: Two times a day (BID) | ORAL | 0 refills | Status: DC

## 2016-05-21 NOTE — Patient Instructions (Signed)

## 2016-05-21 NOTE — Progress Notes (Signed)
Date:  05/21/2016   Name:  Gabrielle Tucker   DOB:  09-17-1960   MRN:  [id number]   Chief Complaint: Cough (Started yesterday fever, cough- no production, and headache. Fever yesterday was 100.5. Cough is now irritating throat. Headache has lasted since yesterday also. ) Cough  This is a new problem. The current episode started yesterday. The problem has been unchanged. The problem occurs every few minutes. The cough is non-productive. Associated symptoms include chills, a fever, headaches, myalgias, a sore throat and shortness of breath. Pertinent negatives include no chest pain or wheezing. She has tried OTC cough suppressant for the symptoms. The treatment provided mild relief.      Review of Systems  Constitutional: Positive for chills and fever.  HENT: Positive for sore throat.   Respiratory: Positive for cough and shortness of breath. Negative for chest tightness and wheezing.   Cardiovascular: Negative for chest pain.  Gastrointestinal: Negative for diarrhea and vomiting.  Musculoskeletal: Positive for myalgias.  Neurological: Positive for weakness and headaches. Negative for dizziness.    Patient Active Problem List   Diagnosis Date Noted  . Dysuria 10/13/2015  . Chest pain 08/03/2014  . Back muscle spasm 07/03/2014  . H/O peptic ulcer 07/03/2014  . Migraine without aura and responsive to treatment 07/03/2014  . Muscle contraction headache 07/03/2014  . Idiopathic insomnia 07/03/2014  . Tachycardia 07/03/2014    Prior to Admission medications   Medication Sig Start Date End Date Taking? Authorizing Provider  amitriptyline (ELAVIL) 25 MG tablet Take 25 mg by mouth. 11/10/15  Yes Historical Provider, MD  fluticasone (FLONASE) 50 MCG/ACT nasal spray Place 2 sprays into both nostrils daily. 02/02/16  Yes Glean Hess, MD  metoprolol succinate (TOPROL-XL) 50 MG 24 hr tablet TAKE 1 TABLET BY MOUTH EVERY DAY 04/13/16  Yes Glean Hess, MD  pantoprazole (PROTONIX)  40 MG tablet Take 1 tablet (40 mg total) by mouth 2 (two) times daily. 12/21/15  Yes Glean Hess, MD  SUMAtriptan Succinate (ONZETRA XSAIL) 11 MG/NOSEPC EXHP Place 11 mg into the nose 2 (two) times daily as needed. 03/02/16  Yes Glean Hess, MD  tiZANidine (ZANAFLEX) 4 MG tablet TAKE 1 TABLET(4 MG) BY MOUTH DAILY AS NEEDED 04/13/16  Yes Glean Hess, MD  zaleplon (SONATA) 10 MG capsule TAKE 1 CAPSULE BY MOUTH EVERY DAY AT BEDTIME 04/13/16  Yes Glean Hess, MD    No Known Allergies  Past Surgical History:  Procedure Laterality Date  . ABDOMINAL HYSTERECTOMY    . BREAST REDUCTION SURGERY    . ESOPHAGOGASTRODUODENOSCOPY  2011   gastric ulcer  . LITHOTRIPSY  2012   with setnet  . LUMBAR DISC SURGERY    . NASAL SINUS SURGERY  2015  . partail knee replacement Right   . REDUCTION MAMMAPLASTY Bilateral 2011    Social History  Substance Use Topics  . Smoking status: Never Smoker  . Smokeless tobacco: Never Used  . Alcohol use 1.2 oz/week    2 Standard drinks or equivalent per week     Medication list has been reviewed and updated.   Physical Exam  Constitutional: She is oriented to person, place, and time. She appears well-developed. She has a sickly appearance. No distress.  HENT:  Head: Normocephalic and atraumatic.  Cardiovascular: Normal rate and regular rhythm.   Pulmonary/Chest: Effort normal and breath sounds normal. No respiratory distress. She has no wheezes. She has no rhonchi.  Musculoskeletal: Normal range of motion.  Neurological: She is alert and oriented to person, place, and time.  Skin: Skin is warm and dry. No rash noted.  Psychiatric: She has a normal mood and affect. Her behavior is normal. Thought content normal.  Nursing note and vitals reviewed.   BP 138/68 (BP Location: Right Arm, Patient Position: Sitting, Cuff Size: Large)   Pulse 88   Temp 98.8 F (37.1 C)   Ht 5\' 6"  (1.676 m)   Wt 223 lb 6.4 oz (101.3 kg)   SpO2 96%   BMI 36.06  kg/m   Assessment and Plan: 1. Influenza-like illness Continue tylenol or advil for fever and headache Increase fluids and rest until fever resolved - oseltamivir (TAMIFLU) 75 MG capsule; Take 1 capsule (75 mg total) by mouth 2 (two) times daily.  Dispense: 10 capsule; Refill: 0 - chlorpheniramine-HYDROcodone (TUSSIONEX PENNKINETIC ER) 10-8 MG/5ML SUER; Take 5 mLs by mouth 2 (two) times daily.  Dispense: 473 mL; Refill: 0   Meds ordered this encounter  Medications  . oseltamivir (TAMIFLU) 75 MG capsule    Sig: Take 1 capsule (75 mg total) by mouth 2 (two) times daily.    Dispense:  10 capsule    Refill:  0  . chlorpheniramine-HYDROcodone (TUSSIONEX PENNKINETIC ER) 10-8 MG/5ML SUER    Sig: Take 5 mLs by mouth 2 (two) times daily.    Dispense:  473 mL    Refill:  0    Halina Maidens, MD [facility]  05/21/2016

## 2016-06-06 ENCOUNTER — Encounter: Payer: Self-pay | Admitting: Internal Medicine

## 2016-06-06 ENCOUNTER — Ambulatory Visit (INDEPENDENT_AMBULATORY_CARE_PROVIDER_SITE_OTHER): Payer: 59 | Admitting: Internal Medicine

## 2016-06-06 VITALS — BP 140/68 | HR 84 | Temp 98.3°F | Ht 66.0 in | Wt 230.0 lb

## 2016-06-06 DIAGNOSIS — J019 Acute sinusitis, unspecified: Secondary | ICD-10-CM | POA: Diagnosis not present

## 2016-06-06 DIAGNOSIS — R69 Illness, unspecified: Secondary | ICD-10-CM | POA: Diagnosis not present

## 2016-06-06 DIAGNOSIS — J111 Influenza due to unidentified influenza virus with other respiratory manifestations: Secondary | ICD-10-CM

## 2016-06-06 MED ORDER — PREDNISONE 10 MG PO TABS: ORAL_TABLET | ORAL | 0 refills | Status: AC

## 2016-06-06 MED ORDER — HYDROCOD POLST-CPM POLST ER 10-8 MG/5ML PO SUER: 5.0000 mL | Freq: Two times a day (BID) | ORAL | 0 refills | Status: DC

## 2016-06-06 MED ORDER — AMOXICILLIN-POT CLAVULANATE 875-125 MG PO TABS: 1.0000 | ORAL_TABLET | Freq: Two times a day (BID) | ORAL | 0 refills | Status: AC

## 2016-06-06 NOTE — Progress Notes (Signed)
Date:  06/06/2016   Name:  Gabrielle Tucker   DOB:  [birth year]   MRN:  [id number]   Chief Complaint: Cough (Voice is almost gone. Has headache and has sinus pressure when "bending over". Green production with cough. )    Review of Systems  Constitutional: Positive for diaphoresis and fatigue. Negative for chills and fever.  HENT: Positive for congestion and sinus pressure.   Respiratory: Positive for cough. Negative for chest tightness and wheezing.   Cardiovascular: Negative for chest pain and palpitations.    Patient Active Problem List   Diagnosis Date Noted  . Dysuria 10/13/2015  . Chest pain 08/03/2014  . Back muscle spasm 07/03/2014  . H/O peptic ulcer 07/03/2014  . Migraine without aura and responsive to treatment 07/03/2014  . Muscle contraction headache 07/03/2014  . Idiopathic insomnia 07/03/2014  . Tachycardia 07/03/2014    Prior to Admission medications   Medication Sig Start Date End Date Taking? Authorizing Provider  amitriptyline (ELAVIL) 25 MG tablet Take 25 mg by mouth. 11/10/15  Yes [provider]  Codeine Polt-Chlorphen Polt ER (TUZISTRA XR) 14.7-2.8 MG/5ML SUER Take 10 mLs by mouth 2 (two) times daily as needed. 05/21/16  Yes Glean Hess, MD  fluticasone [facility]) 50 MCG/ACT nasal spray Place 2 sprays into both nostrils daily. 02/02/16  Yes Glean Hess, MD  guaiFENesin-codeine [facility]) 100-10 MG/5ML syrup Take 5 mLs by mouth 3 (three) times daily as needed for cough. 05/21/16  Yes Glean Hess, MD  metoprolol succinate (TOPROL-XL) 50 MG 24 hr tablet TAKE 1 TABLET BY MOUTH EVERY DAY 04/13/16  Yes Glean Hess, MD  pantoprazole (PROTONIX) 40 MG tablet Take 1 tablet (40 mg total) by mouth 2 (two) times daily. 12/21/15  Yes Glean Hess, MD  SUMAtriptan Succinate (ONZETRA XSAIL) 11 MG/NOSEPC EXHP Place 11 mg into the nose 2 (two) times daily as needed. 03/02/16  Yes Glean Hess, MD  tiZANidine (ZANAFLEX) 4 MG  tablet TAKE 1 TABLET(4 MG) BY MOUTH DAILY AS NEEDED 04/13/16  Yes Glean Hess, MD  zaleplon (SONATA) 10 MG capsule TAKE 1 CAPSULE BY MOUTH EVERY DAY AT BEDTIME 04/13/16  Yes Glean Hess, MD    No Known Allergies  Past Surgical History:  Procedure Laterality Date  . ABDOMINAL HYSTERECTOMY    . BREAST REDUCTION SURGERY    . ESOPHAGOGASTRODUODENOSCOPY  2011   gastric ulcer  . LITHOTRIPSY  2012   with setnet  . LUMBAR DISC SURGERY    . NASAL SINUS SURGERY  2015  . partail knee replacement Right   . REDUCTION MAMMAPLASTY Bilateral 2011    Social History  Substance Use Topics  . Smoking status: Never Smoker  . Smokeless tobacco: Never Used  . Alcohol use 1.2 oz/week    2 Standard drinks or equivalent per week     Medication list has been reviewed and updated.   Physical Exam  Constitutional: She is oriented to person, place, and time. She appears well-developed and well-nourished.  HENT:  Right Ear: External ear and ear canal normal. Tympanic membrane is erythematous and retracted.  Left Ear: External ear and ear canal normal. Tympanic membrane is erythematous and retracted.  Nose: Right sinus exhibits maxillary sinus tenderness and frontal sinus tenderness. Left sinus exhibits maxillary sinus tenderness and frontal sinus tenderness.  Mouth/Throat: Uvula is midline and mucous membranes are normal. No oral lesions. Posterior oropharyngeal erythema present. No oropharyngeal exudate.  Cardiovascular: Normal rate, regular rhythm  and normal heart sounds.   Pulmonary/Chest: Breath sounds normal. She has no wheezes. She has no rales.  Lymphadenopathy:    She has no cervical adenopathy.  Neurological: She is alert and oriented to person, place, and time.    BP 140/68 (BP Location: Right Arm, Patient Position: Sitting, Cuff Size: Large)   Pulse 84   Temp 98.3 F (36.8 C)   Ht 5\' 6"  (1.676 m)   Wt 230 lb (104.3 kg)   SpO2 98%   BMI 37.12 kg/m   Assessment and  Plan: 1. Acute sinusitis, recurrence not specified, unspecified location augmentin and prednisone taper  2. Influenza-like illness Resolved  Meds ordered this encounter  Medications  . chlorpheniramine-HYDROcodone (TUSSIONEX PENNKINETIC ER) 10-8 MG/5ML SUER    Sig: Take 5 mLs by mouth 2 (two) times daily.    Dispense:  120 mL    Refill:  0  . amoxicillin-clavulanate (AUGMENTIN) 875-125 MG tablet    Sig: Take 1 tablet by mouth 2 (two) times daily.    Dispense:  20 tablet    Refill:  0  . predniSONE (DELTASONE) 10 MG tablet    Sig: Take 6 on day 1, 5 on day 2, 4 on day 3, 3 on day 4, 2 on day 5 and 1 on day 1 then stop.    Dispense:  21 tablet    Refill:  0    Halina Maidens, MD [facility]  06/06/2016

## 2016-06-26 ENCOUNTER — Other Ambulatory Visit: Payer: Self-pay | Admitting: Internal Medicine

## 2016-06-26 ENCOUNTER — Telehealth: Payer: Self-pay | Admitting: Internal Medicine

## 2016-06-26 MED ORDER — SULFAMETHOXAZOLE-TRIMETHOPRIM 800-160 MG PO TABS: 1.0000 | ORAL_TABLET | Freq: Two times a day (BID) | ORAL | 0 refills | Status: DC

## 2016-06-26 NOTE — Telephone Encounter (Signed)
Bactrim sent in.  No other prednisone needed.

## 2016-06-26 NOTE — Telephone Encounter (Signed)
Pt informed Bactrim sent to pharmacy. Told if still no relief after finishing antibiotic then to come in for reevaluation.

## 2016-06-26 NOTE — Telephone Encounter (Signed)
Pt requesting different medicine.

## 2016-06-26 NOTE — Telephone Encounter (Signed)
Patient would like a different medicine from Augmentin and prednisone, this one is not working for her-please call.

## 2016-06-28 ENCOUNTER — Other Ambulatory Visit: Payer: Self-pay | Admitting: Internal Medicine

## 2016-06-28 DIAGNOSIS — Z8711 Personal history of peptic ulcer disease: Secondary | ICD-10-CM

## 2016-08-02 ENCOUNTER — Encounter: Payer: Self-pay | Admitting: Internal Medicine

## 2016-08-02 ENCOUNTER — Other Ambulatory Visit: Payer: Self-pay | Admitting: Internal Medicine

## 2016-08-02 ENCOUNTER — Ambulatory Visit (INDEPENDENT_AMBULATORY_CARE_PROVIDER_SITE_OTHER): Payer: 59 | Admitting: Internal Medicine

## 2016-08-02 VITALS — BP 134/82 | HR 79 | Ht 66.0 in | Wt 221.0 lb

## 2016-08-02 DIAGNOSIS — R000 Tachycardia, unspecified: Secondary | ICD-10-CM | POA: Diagnosis not present

## 2016-08-02 DIAGNOSIS — Z8711 Personal history of peptic ulcer disease: Secondary | ICD-10-CM | POA: Diagnosis not present

## 2016-08-02 DIAGNOSIS — G44209 Tension-type headache, unspecified, not intractable: Secondary | ICD-10-CM | POA: Diagnosis not present

## 2016-08-02 DIAGNOSIS — F5101 Primary insomnia: Secondary | ICD-10-CM | POA: Diagnosis not present

## 2016-08-02 DIAGNOSIS — Z1239 Encounter for other screening for malignant neoplasm of breast: Secondary | ICD-10-CM

## 2016-08-02 DIAGNOSIS — Z0000 Encounter for general adult medical examination without abnormal findings: Secondary | ICD-10-CM

## 2016-08-02 DIAGNOSIS — M1712 Unilateral primary osteoarthritis, left knee: Secondary | ICD-10-CM | POA: Diagnosis not present

## 2016-08-02 LAB — POCT URINALYSIS DIPSTICK
BILIRUBIN UA: NEGATIVE
Glucose, UA: NEGATIVE
Ketones, UA: NEGATIVE
Leukocytes, UA: NEGATIVE
NITRITE UA: NEGATIVE
PH UA: 5 (ref 5.0–8.0)
Protein, UA: NEGATIVE
RBC UA: NEGATIVE
Spec Grav, UA: 1.015 (ref 1.010–1.025)
Urobilinogen, UA: 0.2 E.U./dL

## 2016-08-02 NOTE — Patient Instructions (Signed)

## 2016-08-02 NOTE — Progress Notes (Signed)
Date:  08/02/2016   Name:  Gabrielle Tucker   DOB:  Feb 12, 1960   MRN:  [id number]   Chief Complaint: Annual Exam (Breast Exam. No pap- hysterectomy. ) Gabrielle Tucker is a 56 y.o. female who presents today for her Complete Annual Exam. She feels fairly well. She reports exercising none. She reports she is sleeping well using medication nightly. Denies breast complaints.  Gastroesophageal Reflux  She reports no abdominal pain, no chest pain, no coughing or no wheezing. and hx of gastric ulcer. This is a recurrent problem. The problem has been gradually worsening. Pertinent negatives include no fatigue. She has tried a PPI for the symptoms. Past procedures include an EGD.  Headache   This is a recurrent problem. The problem occurs intermittently. The problem has been gradually improving. The pain is located in the occipital region. The pain quality is similar to prior headaches. Associated symptoms include insomnia. Pertinent negatives include no abdominal pain, coughing, dizziness, fever, hearing loss, tinnitus or vomiting. Treatments tried: muscle relaxants. The treatment provided significant relief.  Insomnia  Primary symptoms: no sleep disturbance.  The problem occurs nightly. The problem is unchanged. The symptoms are relieved by medication.  Knee Pain   There was no injury mechanism. The pain is present in the left knee. The pain is moderate. Treatments tried: getting joint fluid injections this week.      Review of Systems  Constitutional: Negative for chills, fatigue and fever.  HENT: Negative for congestion, hearing loss, tinnitus, trouble swallowing and voice change.   Eyes: Negative for visual disturbance.  Respiratory: Negative for cough, chest tightness, shortness of breath and wheezing.   Cardiovascular: Negative for chest pain, palpitations and leg swelling.  Gastrointestinal: Negative for abdominal pain, constipation, diarrhea and vomiting.       Intermittent  epigastric pain  Endocrine: Negative for polydipsia and polyuria.  Genitourinary: Negative for dysuria, frequency, genital sores, vaginal bleeding and vaginal discharge.  Musculoskeletal: Negative for arthralgias, gait problem and joint swelling.  Skin: Negative for color change and rash.  Neurological: Positive for headaches. Negative for dizziness, tremors and light-headedness.  Hematological: Negative for adenopathy. Does not bruise/bleed easily.  Psychiatric/Behavioral: Negative for dysphoric mood and sleep disturbance. The patient has insomnia. The patient is not nervous/anxious.     Patient Active Problem List   Diagnosis Date Noted  . Arthritis of knee, left 08/02/2016  . Back muscle spasm 07/03/2014  . H/O peptic ulcer 07/03/2014  . Migraine without aura and responsive to treatment 07/03/2014  . Muscle contraction headache 07/03/2014  . Idiopathic insomnia 07/03/2014  . Tachycardia 07/03/2014    Prior to Admission medications   Medication Sig Start Date End Date Taking? Authorizing Provider  fluticasone (FLONASE) 50 MCG/ACT nasal spray Place 2 sprays into both nostrils daily. 02/02/16  Yes Glean Hess, MD  metoprolol succinate (TOPROL-XL) 50 MG 24 hr tablet TAKE 1 TABLET BY MOUTH EVERY DAY 04/13/16  Yes Glean Hess, MD  pantoprazole (PROTONIX) 40 MG tablet TAKE 1 TABLET(40 MG) BY MOUTH TWICE DAILY 06/29/16  Yes Glean Hess, MD      Yes Glean Hess, MD  tiZANidine (ZANAFLEX) 4 MG tablet TAKE 1 TABLET(4 MG) BY MOUTH DAILY AS NEEDED 04/13/16  Yes Glean Hess, MD  zaleplon (SONATA) 10 MG capsule TAKE 1 CAPSULE BY MOUTH EVERY DAY AT BEDTIME 04/13/16  Yes Glean Hess, MD    No Known Allergies  Past Surgical History:  Procedure Laterality Date  .  ABDOMINAL HYSTERECTOMY    . BREAST REDUCTION SURGERY    . ESOPHAGOGASTRODUODENOSCOPY  2011   gastric ulcer  . LITHOTRIPSY  2012   with setnet  . LUMBAR DISC SURGERY    . NASAL SINUS SURGERY  2015  .  partail knee replacement Right   . REDUCTION MAMMAPLASTY Bilateral 2011    Social History  Substance Use Topics  . Smoking status: Never Smoker  . Smokeless tobacco: Never Used  . Alcohol use 1.2 oz/week    2 Standard drinks or equivalent per week   Depression screen [facility] 2/9 08/02/2016  Decreased Interest 1  Down, Depressed, Hopeless 0  PHQ - 2 Score 1     Medication list has been reviewed and updated.   Physical Exam  Constitutional: She is oriented to person, place, and time. She appears well-developed and well-nourished. No distress.  HENT:  Head: Normocephalic and atraumatic.  Right Ear: Tympanic membrane and ear canal normal.  Left Ear: Tympanic membrane and ear canal normal.  Nose: Right sinus exhibits no maxillary sinus tenderness. Left sinus exhibits no maxillary sinus tenderness.  Mouth/Throat: Uvula is midline and oropharynx is clear and moist.  Eyes: Conjunctivae and EOM are normal. Right eye exhibits no discharge. Left eye exhibits no discharge. No scleral icterus.  Neck: Normal range of motion. Carotid bruit is not present. No erythema present. No thyromegaly present.  Cardiovascular: Normal rate, regular rhythm, normal heart sounds and normal pulses.   Pulmonary/Chest: Effort normal. No respiratory distress. She has no wheezes. Right breast exhibits no mass, no nipple discharge, no skin change and no tenderness. Left breast exhibits no mass, no nipple discharge, no skin change and no tenderness.  Abdominal: Soft. Bowel sounds are normal. There is no hepatosplenomegaly. There is no tenderness. There is no CVA tenderness.  Musculoskeletal:       Left knee: She exhibits decreased range of motion.  Lymphadenopathy:    She has no cervical adenopathy.    She has no axillary adenopathy.  Neurological: She is alert and oriented to person, place, and time. She has normal reflexes. No cranial nerve deficit or sensory deficit.  Skin: Skin is warm, dry and intact. No rash  noted.  Psychiatric: She has a normal mood and affect. Her speech is normal and behavior is normal. Thought content normal.  Nursing note and vitals reviewed.   BP 134/82   Pulse 79   Ht 5\' 6"  (1.676 m)   Wt 221 lb (100.2 kg)   SpO2 96%   BMI 35.67 kg/m   Assessment and Plan: 1. Annual physical exam Recommend exercise as able - limited by knee pain - Lipid panel - POCT urinalysis dipstick  2. Breast cancer screening - MM DIGITAL SCREENING BILATERAL; Future  3. Arthritis of knee, left Seeing Orthopedics  4. Idiopathic insomnia Continue nightly medication  5. Muscle contraction headache Responding well to PRN therapy  6. H/O peptic ulcer Need to see GI again - pt will schedule with [facility] - CBC with Differential/Platelet  7. Tachycardia controlled - Comprehensive metabolic panel - TSH   No orders of the defined types were placed in this encounter.   Halina Maidens, MD [facility]  08/02/2016

## 2016-08-03 ENCOUNTER — Encounter: Payer: Self-pay | Admitting: Internal Medicine

## 2016-08-03 DIAGNOSIS — E1169 Type 2 diabetes mellitus with other specified complication: Secondary | ICD-10-CM | POA: Insufficient documentation

## 2016-08-03 DIAGNOSIS — E785 Hyperlipidemia, unspecified: Secondary | ICD-10-CM | POA: Insufficient documentation

## 2016-08-03 LAB — COMPREHENSIVE METABOLIC PANEL
ALBUMIN: 4.5 g/dL (ref 3.5–5.5)
ALT: 41 IU/L — AB (ref 0–32)
AST: 41 IU/L — ABNORMAL HIGH (ref 0–40)
Albumin/Globulin Ratio: 1.6 (ref 1.2–2.2)
Alkaline Phosphatase: 110 IU/L (ref 39–117)
BILIRUBIN TOTAL: 0.6 mg/dL (ref 0.0–1.2)
BUN / CREAT RATIO: 15 (ref 9–23)
BUN: 11 mg/dL (ref 6–24)
CHLORIDE: 100 mmol/L (ref 96–106)
CO2: 24 mmol/L (ref 20–29)
Calcium: 9.5 mg/dL (ref 8.7–10.2)
Creatinine, Ser: 0.73 mg/dL (ref 0.57–1.00)
GFR calc non Af Amer: 92 mL/min/{1.73_m2} (ref >59)
GFR, EST AFRICAN AMERICAN: 106 mL/min/{1.73_m2} (ref >59)
Globulin, Total: 2.8 g/dL (ref 1.5–4.5)
Glucose: 82 mg/dL (ref 65–99)
POTASSIUM: 4.4 mmol/L (ref 3.5–5.2)
Sodium: 142 mmol/L (ref 134–144)
TOTAL PROTEIN: 7.3 g/dL (ref 6.0–8.5)

## 2016-08-03 LAB — CBC WITH DIFFERENTIAL/PLATELET
BASOS: 0 %
Basophils Absolute: 0 10*3/uL (ref 0.0–0.2)
EOS (ABSOLUTE): 0.2 10*3/uL (ref 0.0–0.4)
Eos: 2 %
Hematocrit: 41 % (ref 34.0–46.6)
Hemoglobin: 13.6 g/dL (ref 11.1–15.9)
IMMATURE GRANS (ABS): 0 10*3/uL (ref 0.0–0.1)
Immature Granulocytes: 0 %
LYMPHS: 29 %
Lymphocytes Absolute: 2 10*3/uL (ref 0.7–3.1)
MCH: 30.8 pg (ref 26.6–33.0)
MCHC: 33.2 g/dL (ref 31.5–35.7)
MCV: 93 fL (ref 79–97)
Monocytes Absolute: 0.4 10*3/uL (ref 0.1–0.9)
Monocytes: 5 %
Neutrophils Absolute: 4.4 10*3/uL (ref 1.4–7.0)
Neutrophils: 64 %
PLATELETS: 211 10*3/uL (ref 150–379)
RBC: 4.42 x10E6/uL (ref 3.77–5.28)
RDW: 14.2 % (ref 12.3–15.4)
WBC: 7 10*3/uL (ref 3.4–10.8)

## 2016-08-03 LAB — LIPID PANEL
Chol/HDL Ratio: 4.8 ratio — ABNORMAL HIGH (ref 0.0–4.4)
Cholesterol, Total: 205 mg/dL — ABNORMAL HIGH (ref 100–199)
HDL: 43 mg/dL (ref >39)
LDL Calculated: 120 mg/dL — ABNORMAL HIGH (ref 0–99)
Triglycerides: 212 mg/dL — ABNORMAL HIGH (ref 0–149)
VLDL Cholesterol Cal: 42 mg/dL — ABNORMAL HIGH (ref 5–40)

## 2016-08-03 LAB — TSH: TSH: 2.47 u[IU]/mL (ref 0.450–4.500)

## 2016-08-13 DIAGNOSIS — M2550 Pain in unspecified joint: Secondary | ICD-10-CM | POA: Insufficient documentation

## 2016-08-14 ENCOUNTER — Ambulatory Visit
Admission: RE | Admit: 2016-08-14 | Discharge: 2016-08-14 | Disposition: A | Discharge status: Home / Self Care | Payer: 59 | Source: Ambulatory Visit | Attending: Internal Medicine | Admitting: Internal Medicine

## 2016-08-14 DIAGNOSIS — Z1231 Encounter for screening mammogram for malignant neoplasm of breast: Secondary | ICD-10-CM | POA: Diagnosis not present

## 2016-08-14 DIAGNOSIS — Z1239 Encounter for other screening for malignant neoplasm of breast: Secondary | ICD-10-CM

## 2016-09-19 ENCOUNTER — Other Ambulatory Visit: Payer: Self-pay | Admitting: Internal Medicine

## 2016-10-16 ENCOUNTER — Other Ambulatory Visit: Payer: Self-pay | Admitting: Internal Medicine

## 2016-10-17 NOTE — Telephone Encounter (Signed)
Pt is set up for 08/05/17.

## 2016-11-02 ENCOUNTER — Ambulatory Visit (INDEPENDENT_AMBULATORY_CARE_PROVIDER_SITE_OTHER): Payer: 59 | Admitting: Internal Medicine

## 2016-11-02 ENCOUNTER — Encounter: Payer: Self-pay | Admitting: Internal Medicine

## 2016-11-02 VITALS — BP 122/70 | HR 88 | Ht 66.0 in | Wt 226.0 lb

## 2016-11-02 DIAGNOSIS — M7711 Lateral epicondylitis, right elbow: Secondary | ICD-10-CM

## 2016-11-02 DIAGNOSIS — I809 Phlebitis and thrombophlebitis of unspecified site: Secondary | ICD-10-CM | POA: Diagnosis not present

## 2016-11-02 MED ORDER — PREDNISONE 10 MG PO TABS: ORAL_TABLET | ORAL | 0 refills | Status: DC

## 2016-11-02 NOTE — Progress Notes (Signed)
Date:  11/02/2016   Name:  Gabrielle Tucker   DOB:  Jul 16, 1960   MRN:  [id number]   Chief Complaint: Arm Pain (Right arm started 1 month ago. Pain starts at elbow down into fingers. Having tingling and numbness in pinky finger. Has a knot close to elbow. ) Arm Pain   Associated symptoms include numbness. Pertinent negatives include no chest pain.  she has a history of elbow tendinitis but it usually only lasts about a day. She's been using an elbow strap.She's been taking a little bit of ibuprofen but can't take much because of gastritis. She's also noticed a swelling of her mid forearm. No trauma or injury that she is aware of. It is not especially tender to touch.   Review of Systems  Constitutional: Negative for chills and fever.  Respiratory: Negative for chest tightness and shortness of breath.   Cardiovascular: Negative for chest pain.  Musculoskeletal: Positive for arthralgias.  Neurological: Positive for numbness. Negative for dizziness and headaches.    Patient Active Problem List   Diagnosis Date Noted  . Hyperlipidemia, mild 08/03/2016  . Arthritis of knee, left 08/02/2016  . Back muscle spasm 07/03/2014  . H/O peptic ulcer 07/03/2014  . Migraine without aura and responsive to treatment 07/03/2014  . Muscle contraction headache 07/03/2014  . Idiopathic insomnia 07/03/2014  . Tachycardia 07/03/2014    Prior to Admission medications   Medication Sig Start Date End Date Taking? Authorizing Provider  fluticasone (FLONASE) 50 MCG/ACT nasal spray Place 2 sprays into both nostrils daily. 02/02/16   Glean Hess, MD  metoprolol succinate (TOPROL-XL) 50 MG 24 hr tablet TAKE 1 TABLET BY MOUTH EVERY DAY 10/16/16   Glean Hess, MD  pantoprazole (PROTONIX) 40 MG tablet TAKE 1 TABLET(40 MG) BY MOUTH TWICE DAILY 06/29/16   Glean Hess, MD  SUMAtriptan Succinate (ONZETRA XSAIL) 11 MG/NOSEPC EXHP Place 11 mg into the nose 2 (two) times daily as needed. Patient not  taking: Reported on 08/02/2016 03/02/16   Glean Hess, MD  tiZANidine (ZANAFLEX) 4 MG tablet TAKE 1 TABLET(4 MG) BY MOUTH DAILY AS NEEDED 09/20/16   Glean Hess, MD  zaleplon (SONATA) 10 MG capsule Take 1 capsule (10 mg total) by mouth at bedtime as needed for sleep. 10/17/16   Glean Hess, MD    No Known Allergies  Past Surgical History:  Procedure Laterality Date  . ABDOMINAL HYSTERECTOMY    . BREAST REDUCTION SURGERY    . ESOPHAGOGASTRODUODENOSCOPY  2011   gastric ulcer  . LITHOTRIPSY  2012   with setnet  . LUMBAR DISC SURGERY    . NASAL SINUS SURGERY  2015  . partail knee replacement Right   . REDUCTION MAMMAPLASTY Bilateral 2011    Social History  Substance Use Topics  . Smoking status: Never Smoker  . Smokeless tobacco: Never Used  . Alcohol use 1.2 oz/week    2 Standard drinks or equivalent per week     Medication list has been reviewed and updated.  PHQ 2/9 Scores 08/02/2016  PHQ - 2 Score 1    Physical Exam  Constitutional: She is oriented to person, place, and time. She appears well-developed. No distress.  HENT:  Head: Normocephalic and atraumatic.  Cardiovascular: Normal rate, regular rhythm and normal heart sounds.   Pulmonary/Chest: Effort normal and breath sounds normal. No respiratory distress. She has no wheezes.  Musculoskeletal: She exhibits no edema.       Arms: Neurological: She  is alert and oriented to person, place, and time.  Skin: Skin is warm and dry. No rash noted.  Psychiatric: She has a normal mood and affect. Her behavior is normal. Thought content normal.  Nursing note and vitals reviewed.   BP 122/70   Pulse 88   Ht 5\' 6"  (1.676 m)   Wt 226 lb (102.5 kg)   SpO2 96%   BMI 36.48 kg/m   Assessment and Plan: 1. Lateral epicondylitis of right elbow Continue elbow strap - predniSONE (DELTASONE) 10 MG tablet; Take 6 on day 1, 5 on day 2, 4 on day 3, 3 on day 4, 2 on day 5 and 1 on day 1 then stop.  Dispense: 21 tablet;  Refill: 0  2. Phlebitis Apply heat 20 min three times per day as needed   Meds ordered this encounter  Medications  . predniSONE (DELTASONE) 10 MG tablet    Sig: Take 6 on day 1, 5 on day 2, 4 on day 3, 3 on day 4, 2 on day 5 and 1 on day 1 then stop.    Dispense:  21 tablet    Refill:  0    Partially dictated using Editor, commissioning. Any errors are unintentional.  Halina Maidens, MD [facility]  11/02/2016

## 2016-12-24 DIAGNOSIS — R29898 Other symptoms and signs involving the musculoskeletal system: Secondary | ICD-10-CM | POA: Diagnosis not present

## 2016-12-24 DIAGNOSIS — M25562 Pain in left knee: Secondary | ICD-10-CM | POA: Diagnosis not present

## 2016-12-24 DIAGNOSIS — R609 Edema, unspecified: Secondary | ICD-10-CM | POA: Diagnosis not present

## 2016-12-24 DIAGNOSIS — Z96651 Presence of right artificial knee joint: Secondary | ICD-10-CM | POA: Diagnosis not present

## 2016-12-29 ENCOUNTER — Other Ambulatory Visit: Payer: Self-pay | Admitting: Internal Medicine

## 2016-12-29 DIAGNOSIS — Z8711 Personal history of peptic ulcer disease: Secondary | ICD-10-CM

## 2017-01-03 DIAGNOSIS — R600 Localized edema: Secondary | ICD-10-CM | POA: Diagnosis not present

## 2017-01-03 DIAGNOSIS — M25562 Pain in left knee: Secondary | ICD-10-CM | POA: Diagnosis not present

## 2017-01-03 DIAGNOSIS — R29898 Other symptoms and signs involving the musculoskeletal system: Secondary | ICD-10-CM | POA: Diagnosis not present

## 2017-01-03 DIAGNOSIS — Z96651 Presence of right artificial knee joint: Secondary | ICD-10-CM | POA: Diagnosis not present

## 2017-01-07 DIAGNOSIS — M25662 Stiffness of left knee, not elsewhere classified: Secondary | ICD-10-CM | POA: Diagnosis not present

## 2017-01-07 DIAGNOSIS — R29898 Other symptoms and signs involving the musculoskeletal system: Secondary | ICD-10-CM | POA: Diagnosis not present

## 2017-01-07 DIAGNOSIS — M25562 Pain in left knee: Secondary | ICD-10-CM | POA: Diagnosis not present

## 2017-01-07 DIAGNOSIS — Z96651 Presence of right artificial knee joint: Secondary | ICD-10-CM | POA: Diagnosis not present

## 2017-01-10 ENCOUNTER — Other Ambulatory Visit: Payer: Self-pay

## 2017-01-10 MED ORDER — FLUCONAZOLE 150 MG PO TABS: 150.0000 mg | ORAL_TABLET | Freq: Once | ORAL | 0 refills | Status: DC

## 2017-01-10 NOTE — Progress Notes (Signed)
Patient called stating she is having yeast infection X 3 days. Used 3-day monistat with no relief. Wanted to know if we can send something to her [employer]. Sent diflucan #1 RF to her pharmacy. Informed if does not get any better we will need to see her for appt. She verbalized understanding.

## 2017-01-14 ENCOUNTER — Other Ambulatory Visit: Payer: Self-pay | Admitting: Internal Medicine

## 2017-01-21 DIAGNOSIS — Z96651 Presence of right artificial knee joint: Secondary | ICD-10-CM | POA: Diagnosis not present

## 2017-01-21 DIAGNOSIS — M25562 Pain in left knee: Secondary | ICD-10-CM | POA: Diagnosis not present

## 2017-01-21 DIAGNOSIS — R29898 Other symptoms and signs involving the musculoskeletal system: Secondary | ICD-10-CM | POA: Diagnosis not present

## 2017-01-21 DIAGNOSIS — R609 Edema, unspecified: Secondary | ICD-10-CM | POA: Diagnosis not present

## 2017-01-30 DIAGNOSIS — M25562 Pain in left knee: Secondary | ICD-10-CM | POA: Diagnosis not present

## 2017-01-30 DIAGNOSIS — R29898 Other symptoms and signs involving the musculoskeletal system: Secondary | ICD-10-CM | POA: Diagnosis not present

## 2017-01-30 DIAGNOSIS — Z96651 Presence of right artificial knee joint: Secondary | ICD-10-CM | POA: Diagnosis not present

## 2017-03-15 ENCOUNTER — Other Ambulatory Visit: Payer: Self-pay | Admitting: Internal Medicine

## 2017-03-29 ENCOUNTER — Encounter: Payer: Self-pay | Admitting: Family Medicine

## 2017-03-29 ENCOUNTER — Ambulatory Visit: Payer: BLUE CROSS/BLUE SHIELD | Admitting: Family Medicine

## 2017-03-29 VITALS — BP 120/80 | HR 112 | Temp 98.0°F | Ht 66.0 in | Wt 222.0 lb

## 2017-03-29 DIAGNOSIS — J0100 Acute maxillary sinusitis, unspecified: Secondary | ICD-10-CM

## 2017-03-29 DIAGNOSIS — N761 Subacute and chronic vaginitis: Secondary | ICD-10-CM

## 2017-03-29 MED ORDER — FLUCONAZOLE 150 MG PO TABS: 150.0000 mg | ORAL_TABLET | Freq: Once | ORAL | 0 refills | Status: AC

## 2017-03-29 MED ORDER — AMOXICILLIN 500 MG PO CAPS: 500.0000 mg | ORAL_CAPSULE | Freq: Three times a day (TID) | ORAL | 0 refills | Status: DC

## 2017-03-29 NOTE — Progress Notes (Signed)
Name: Gabrielle Tucker   MRN: [id number]    DOB: 1960-08-18   Date:03/29/2017       Progress Note  Subjective  Chief Complaint  Chief Complaint  Patient presents with  . Sinusitis    pressure in head- taking mucinex D x 3 days- not helping. Head hurts when bends over    Sinusitis  This is a new problem. The current episode started in the past 7 days. The problem has been gradually worsening since onset. There has been no fever. Her pain is at a severity of 3/10. The pain is moderate. Associated symptoms include chills, congestion, ear pain, headaches, sinus pressure, sneezing and a sore throat. Pertinent negatives include no coughing, diaphoresis, hoarse voice, neck pain, shortness of breath or swollen glands. (Blood per sinus drainage) Treatments tried: mucinex d /flonase. The treatment provided mild relief.    No problem-specific Assessment & Plan notes found for this encounter.   History reviewed. No pertinent past medical history.  Past Surgical History:  Procedure Laterality Date  . ABDOMINAL HYSTERECTOMY    . BREAST REDUCTION SURGERY    . ESOPHAGOGASTRODUODENOSCOPY  2011   gastric ulcer  . LITHOTRIPSY  2012   with setnet  . LUMBAR DISC SURGERY    . NASAL SINUS SURGERY  2015  . partail knee replacement Right   . REDUCTION MAMMAPLASTY Bilateral 2011    Family History  Problem Relation Age of Onset  . Stomach cancer Father   . Diabetes Brother   . Breast cancer Neg Hx     Social History   Socioeconomic History  . Marital status: Divorced    Spouse name: Not on file  . Number of children: Not on file  . Years of education: Not on file  . Highest education level: Not on file  Social Needs  . Financial resource strain: Not on file  . Food insecurity - worry: Not on file  . Food insecurity - inability: Not on file  . Transportation needs - medical: Not on file  . Transportation needs - non-medical: Not on file  Occupational History  . Not on file  Tobacco Use   . Smoking status: Never Smoker  . Smokeless tobacco: Never Used  Substance and Sexual Activity  . Alcohol use: Yes    Alcohol/week: 1.2 oz    Types: 2 Standard drinks or equivalent per week  . Drug use: Not on file  . Sexual activity: Not on file  Other Topics Concern  . Not on file  Social History Narrative  . Not on file    No Known Allergies  Outpatient Medications Prior to Visit  Medication Sig Dispense Refill  . fluticasone (FLONASE) 50 MCG/ACT nasal spray Place 2 sprays into both nostrils daily. 16 g 5  . metoprolol succinate (TOPROL-XL) 50 MG 24 hr tablet TAKE 1 TABLET BY MOUTH EVERY DAY 90 tablet 3  . pantoprazole (PROTONIX) 40 MG tablet TAKE 1 TABLET(40 MG) BY MOUTH TWICE DAILY 60 tablet 12  . tiZANidine (ZANAFLEX) 4 MG tablet TAKE 1 TABLET(4 MG) BY MOUTH DAILY AS NEEDED 90 tablet 1  . zaleplon (SONATA) 10 MG capsule Take 1 capsule (10 mg total) by mouth at bedtime as needed for sleep. 30 capsule 5  . predniSONE (DELTASONE) 10 MG tablet Take 6 on day 1, 5 on day 2, 4 on day 3, 3 on day 4, 2 on day 5 and 1 on day 1 then stop. 21 tablet 0  . SUMAtriptan Succinate (ONZETRA XSAIL)  11 MG/NOSEPC EXHP Place 11 mg into the nose 2 (two) times daily as needed. 6 each 5   Facility-Administered Medications Prior to Visit  Medication Dose Route Frequency Provider Last Rate Last Dose  . betamethasone acetate-betamethasone sodium phosphate (CELESTONE) injection 3 mg  3 mg Intramuscular Once Edrick Kins, DPM        Review of Systems  Constitutional: Positive for chills. Negative for diaphoresis, fever, malaise/fatigue and weight loss.  HENT: Positive for congestion, ear pain, sinus pressure, sneezing and sore throat. Negative for ear discharge and hoarse voice.   Eyes: Negative for blurred vision.  Respiratory: Negative for cough, sputum production, shortness of breath and wheezing.   Cardiovascular: Negative for chest pain, palpitations and leg swelling.  Gastrointestinal:  Negative for abdominal pain, blood in stool, constipation, diarrhea, heartburn, melena and nausea.  Genitourinary: Negative for dysuria, frequency, hematuria and urgency.  Musculoskeletal: Negative for back pain, joint pain, myalgias and neck pain.  Skin: Negative for rash.  Neurological: Positive for headaches. Negative for dizziness, tingling, sensory change and focal weakness.  Endo/Heme/Allergies: Negative for environmental allergies and polydipsia. Does not bruise/bleed easily.  Psychiatric/Behavioral: Negative for depression and suicidal ideas. The patient is not nervous/anxious and does not have insomnia.      Objective  Vitals:   03/29/17 1514  BP: 120/80  Pulse: (!) 112  Temp: 98 F (36.7 C)  TempSrc: Oral  Weight: 222 lb (100.7 kg)  Height: 5\' 6"  (1.676 m)    Physical Exam  Constitutional: She is well-developed, well-nourished, and in no distress. No distress.  HENT:  Head: Normocephalic and atraumatic.  Right Ear: Hearing, tympanic membrane, external ear and ear canal normal.  Left Ear: Hearing, external ear and ear canal normal. A middle ear effusion is present.  Nose: Mucosal edema and rhinorrhea present. Right sinus exhibits maxillary sinus tenderness. Left sinus exhibits maxillary sinus tenderness.  Mouth/Throat: Oropharynx is clear and moist. No oropharyngeal exudate, posterior oropharyngeal edema or posterior oropharyngeal erythema.  Eyes: Conjunctivae and EOM are normal. Pupils are equal, round, and reactive to light. Right eye exhibits no discharge. Left eye exhibits no discharge.  Neck: Normal range of motion. Neck supple. No JVD present. No thyromegaly present.  Cardiovascular: Normal rate, regular rhythm, normal heart sounds and intact distal pulses. Exam reveals no gallop and no friction rub.  No murmur heard. Pulmonary/Chest: Effort normal and breath sounds normal. She has no wheezes. She has no rales.  Abdominal: Soft. Bowel sounds are normal. She exhibits  no mass. There is no tenderness. There is no guarding.  Musculoskeletal: Normal range of motion. She exhibits no edema.  Lymphadenopathy:    She has no cervical adenopathy.  Neurological: She is alert. She has normal reflexes.  Skin: Skin is warm and dry. She is not diaphoretic.  Psychiatric: Mood and affect normal.  Nursing note and vitals reviewed.     Assessment & Plan  Problem List Items Addressed This Visit    None    Visit Diagnoses    Acute maxillary sinusitis, recurrence not specified    -  Primary   Relevant Medications   amoxicillin (AMOXIL) 500 MG capsule   fluconazole (DIFLUCAN) 150 MG tablet   Subacute vaginitis       Relevant Medications   fluconazole (DIFLUCAN) 150 MG tablet      Meds ordered this encounter  Medications  . amoxicillin (AMOXIL) 500 MG capsule    Sig: Take 1 capsule (500 mg total) by mouth 3 (three) times  daily.    Dispense:  30 capsule    Refill:  0  . fluconazole (DIFLUCAN) 150 MG tablet    Sig: Take 1 tablet (150 mg total) by mouth once for 1 dose.    Dispense:  1 tablet    Refill:  0      Dr. Otilio Miu [facility]  03/29/17

## 2017-04-11 ENCOUNTER — Other Ambulatory Visit: Payer: Self-pay | Admitting: Internal Medicine

## 2017-04-15 ENCOUNTER — Encounter: Payer: Self-pay | Admitting: Internal Medicine

## 2017-04-15 ENCOUNTER — Ambulatory Visit: Payer: BLUE CROSS/BLUE SHIELD | Admitting: Internal Medicine

## 2017-04-15 VITALS — BP 132/68 | HR 77 | Ht 66.0 in | Wt 222.0 lb

## 2017-04-15 DIAGNOSIS — F39 Unspecified mood [affective] disorder: Secondary | ICD-10-CM | POA: Diagnosis not present

## 2017-04-15 MED ORDER — ESCITALOPRAM OXALATE 10 MG PO TABS: 10.0000 mg | ORAL_TABLET | Freq: Every day | ORAL | 5 refills | Status: DC

## 2017-04-15 NOTE — Progress Notes (Signed)
Date:  04/15/2017   Name:  Gabrielle Tucker   DOB:  May 03, 1960   MRN:  [id number]   Chief Complaint: Depression (PHQ9- 60)  Depression         This is a new problem.  The onset quality is gradual.   The problem occurs daily.The problem is unchanged.  Associated symptoms include decreased concentration, fatigue, insomnia, irritable, decreased interest, appetite change and sad.  Associated symptoms include no helplessness, no hopelessness and no suicidal ideas.     The symptoms are aggravated by family issues.  Past treatments include nothing. Her husband has to sell the house to settle with his ex-wife.  They are moving to a new place but it is not ready.  She has moved in with her mother and he is still living in the house until it is sold.  She is hopeful that things will smooth over soon but right now she is not coping well.    Review of Systems  Constitutional: Positive for appetite change and fatigue. Negative for diaphoresis and fever.  Respiratory: Negative for chest tightness and shortness of breath.   Cardiovascular: Negative for chest pain and palpitations.  Psychiatric/Behavioral: Positive for decreased concentration, depression and dysphoric mood. Negative for suicidal ideas. The patient is nervous/anxious and has insomnia.     Patient Active Problem List   Diagnosis Date Noted  . Hyperlipidemia, mild 08/03/2016  . Arthritis of knee, left 08/02/2016  . Back muscle spasm 07/03/2014  . H/O peptic ulcer 07/03/2014  . Migraine without aura and responsive to treatment 07/03/2014  . Muscle contraction headache 07/03/2014  . Idiopathic insomnia 07/03/2014  . Tachycardia 07/03/2014    Prior to Admission medications   Medication Sig Start Date End Date Taking? Authorizing Provider  fluticasone (FLONASE) 50 MCG/ACT nasal spray Place 2 sprays into both nostrils daily. 02/02/16  Yes Glean Hess, MD  metoprolol succinate (TOPROL-XL) 50 MG 24 hr tablet TAKE 1 TABLET BY  MOUTH EVERY DAY 01/14/17  Yes Glean Hess, MD  pantoprazole (PROTONIX) 40 MG tablet TAKE 1 TABLET(40 MG) BY MOUTH TWICE DAILY 12/29/16  Yes Glean Hess, MD  tiZANidine (ZANAFLEX) 4 MG tablet TAKE 1 TABLET(4 MG) BY MOUTH DAILY AS NEEDED 03/15/17  Yes Glean Hess, MD  zaleplon (SONATA) 10 MG capsule TAKE ONE CAPSULE BY MOUTH AT BEDTIME AS NEEDED FOR SLEEP 04/11/17  Yes Glean Hess, MD    No Known Allergies  Past Surgical History:  Procedure Laterality Date  . ABDOMINAL HYSTERECTOMY    . BREAST REDUCTION SURGERY    . ESOPHAGOGASTRODUODENOSCOPY  2011   gastric ulcer  . LITHOTRIPSY  2012   with setnet  . LUMBAR DISC SURGERY    . NASAL SINUS SURGERY  2015  . partail knee replacement Right   . REDUCTION MAMMAPLASTY Bilateral 2011    Social History   Tobacco Use  . Smoking status: Never Smoker  . Smokeless tobacco: Never Used  Substance Use Topics  . Alcohol use: Yes    Alcohol/week: 1.2 oz    Types: 2 Standard drinks or equivalent per week  . Drug use: Not on file     Medication list has been reviewed and updated.  PHQ 2/9 Scores 04/15/2017 08/02/2016  PHQ - 2 Score 5 1  PHQ- 9 Score 17 -    Physical Exam  Constitutional: She is oriented to person, place, and time. She appears well-developed. She is irritable. No distress.  HENT:  Head: Normocephalic  and atraumatic.  Cardiovascular: Normal rate, regular rhythm and normal heart sounds.  Pulmonary/Chest: Effort normal and breath sounds normal. No respiratory distress. She has no wheezes.  Musculoskeletal: She exhibits no edema or tenderness.  Neurological: She is alert and oriented to person, place, and time.  Skin: Skin is warm and dry. No rash noted.  Psychiatric: She has a normal mood and affect. Her speech is normal and behavior is normal. Thought content normal.  Nursing note and vitals reviewed.   BP 132/68   Pulse 77   Ht 5\' 6"  (1.676 m)   Wt 222 lb (100.7 kg)   SpO2 98%   BMI 35.83 kg/m     Assessment and Plan: 1. Mood disorder ([place]) Will follow up if not improving - escitalopram (LEXAPRO) 10 MG tablet; Take 1 tablet (10 mg total) by mouth daily.  Dispense: 30 tablet; Refill: 5   Meds ordered this encounter  Medications  . escitalopram (LEXAPRO) 10 MG tablet    Sig: Take 1 tablet (10 mg total) by mouth daily.    Dispense:  30 tablet    Refill:  5    Partially dictated using Editor, commissioning. Any errors are unintentional.  Halina Maidens, MD [facility]  04/15/2017

## 2017-05-01 DIAGNOSIS — D1800 Hemangioma unspecified site: Secondary | ICD-10-CM | POA: Diagnosis not present

## 2017-05-01 DIAGNOSIS — D229 Melanocytic nevi, unspecified: Secondary | ICD-10-CM | POA: Diagnosis not present

## 2017-06-11 DIAGNOSIS — H33193 Other retinoschisis and retinal cysts, bilateral: Secondary | ICD-10-CM | POA: Diagnosis not present

## 2017-06-21 ENCOUNTER — Ambulatory Visit: Payer: BLUE CROSS/BLUE SHIELD | Admitting: Internal Medicine

## 2017-06-21 ENCOUNTER — Encounter: Payer: Self-pay | Admitting: Internal Medicine

## 2017-06-21 VITALS — BP 150/100 | HR 98 | Temp 98.0°F | Resp 16 | Ht 66.0 in | Wt 217.0 lb

## 2017-06-21 DIAGNOSIS — F5102 Adjustment insomnia: Secondary | ICD-10-CM

## 2017-06-21 DIAGNOSIS — F4321 Adjustment disorder with depressed mood: Secondary | ICD-10-CM

## 2017-06-21 MED ORDER — CLONAZEPAM 0.5 MG PO TABS: 0.2500 mg | ORAL_TABLET | Freq: Two times a day (BID) | ORAL | 0 refills | Status: DC | PRN

## 2017-06-21 NOTE — Progress Notes (Signed)
Date:  06/21/2017   Name:  Gabrielle Tucker   DOB:  09/18/1960   MRN:  [id number]   Chief Complaint: Depression and Anxiety (seperated from husband ) Depression         This is a new problem.  The onset quality is sudden. The problem is unchanged.  Associated symptoms include decreased concentration.  Associated symptoms include no fatigue and no suicidal ideas.     The symptoms are aggravated by social issues (husband left yesterday with all his belongings, has cut off his phone and disappeared.).   Review of Systems  Constitutional: Negative for chills, fatigue and fever.  Respiratory: Negative for chest tightness and shortness of breath.   Cardiovascular: Negative for chest pain.  Hematological: Negative for adenopathy.  Psychiatric/Behavioral: Positive for decreased concentration, depression, dysphoric mood and sleep disturbance. Negative for suicidal ideas. The patient is nervous/anxious.     Patient Active Problem List   Diagnosis Date Noted  . Hyperlipidemia, mild 08/03/2016  . Arthritis of knee, left 08/02/2016  . Back muscle spasm 07/03/2014  . H/O peptic ulcer 07/03/2014  . Migraine without aura and responsive to treatment 07/03/2014  . Muscle contraction headache 07/03/2014  . Idiopathic insomnia 07/03/2014  . Tachycardia 07/03/2014    Prior to Admission medications   Medication Sig Start Date End Date Taking? Authorizing Provider  fluticasone (FLONASE) 50 MCG/ACT nasal spray Place 2 sprays into both nostrils daily. 02/02/16  Yes Glean Hess, MD  metoprolol succinate (TOPROL-XL) 50 MG 24 hr tablet TAKE 1 TABLET BY MOUTH EVERY DAY 01/14/17  Yes Glean Hess, MD  pantoprazole (PROTONIX) 40 MG tablet TAKE 1 TABLET(40 MG) BY MOUTH TWICE DAILY 12/29/16  Yes Glean Hess, MD  tiZANidine (ZANAFLEX) 4 MG tablet TAKE 1 TABLET(4 MG) BY MOUTH DAILY AS NEEDED 03/15/17  Yes Glean Hess, MD  zaleplon (SONATA) 10 MG capsule TAKE ONE CAPSULE BY MOUTH AT BEDTIME  AS NEEDED FOR SLEEP 04/11/17  Yes Glean Hess, MD  escitalopram (LEXAPRO) 10 MG tablet Take 1 tablet (10 mg total) by mouth daily. Patient not taking: Reported on 06/21/2017 04/15/17   Glean Hess, MD    No Known Allergies  Past Surgical History:  Procedure Laterality Date  . ABDOMINAL HYSTERECTOMY    . BREAST REDUCTION SURGERY    . ESOPHAGOGASTRODUODENOSCOPY  2011   gastric ulcer  . LITHOTRIPSY  2012   with setnet  . LUMBAR DISC SURGERY    . NASAL SINUS SURGERY  2015  . partail knee replacement Right   . REDUCTION MAMMAPLASTY Bilateral 2011    Social History   Tobacco Use  . Smoking status: Never Smoker  . Smokeless tobacco: Never Used  Substance Use Topics  . Alcohol use: Yes    Alcohol/week: 1.2 oz    Types: 2 Standard drinks or equivalent per week  . Drug use: Not on file     Medication list has been reviewed and updated.  Current Meds  Medication Sig  . fluticasone (FLONASE) 50 MCG/ACT nasal spray Place 2 sprays into both nostrils daily.  . metoprolol succinate (TOPROL-XL) 50 MG 24 hr tablet TAKE 1 TABLET BY MOUTH EVERY DAY  . pantoprazole (PROTONIX) 40 MG tablet TAKE 1 TABLET(40 MG) BY MOUTH TWICE DAILY  . tiZANidine (ZANAFLEX) 4 MG tablet TAKE 1 TABLET(4 MG) BY MOUTH DAILY AS NEEDED  . zaleplon (SONATA) 10 MG capsule TAKE ONE CAPSULE BY MOUTH AT BEDTIME AS NEEDED FOR SLEEP   Current  Facility-Administered Medications for the 06/21/17 encounter (Office Visit) with Glean Hess, MD  Medication  . betamethasone acetate-betamethasone sodium phosphate (CELESTONE) injection 3 mg    PHQ 2/9 Scores 06/21/2017 04/15/2017 08/02/2016  PHQ - 2 Score 4 5 1   PHQ- 9 Score 22 17 -    Physical Exam  Constitutional: She is oriented to person, place, and time. She appears well-developed. No distress.  HENT:  Head: Normocephalic and atraumatic.  Neck: Normal range of motion. Neck supple.  Cardiovascular: Normal rate, regular rhythm and normal heart sounds.    Pulmonary/Chest: Effort normal and breath sounds normal. No respiratory distress.  Musculoskeletal: Normal range of motion.  Neurological: She is alert and oriented to person, place, and time.  Skin: Skin is warm and dry. No rash noted.  Psychiatric: Her behavior is normal. Thought content normal. Cognition and memory are normal. She exhibits a depressed mood (tearful but appropriate).  Nursing note and vitals reviewed.   BP (!) 150/100   Pulse 98   Temp 98 F (36.7 C) (Oral)   Resp 16   Ht 5\' 6"  (1.676 m)   Wt 217 lb (98.4 kg)   SpO2 99%   BMI 35.02 kg/m   Assessment and Plan: 1. Grief reaction Resume Lexapro  2. Adjustment insomnia Clonazepam 0.5 mg at hs and 0.25 mg bid prn   Meds ordered this encounter  Medications  . clonazePAM (KLONOPIN) 0.5 MG tablet    Sig: Take 0.5 tablets (0.25 mg total) by mouth 2 (two) times daily as needed for anxiety (and insomnia). And 0.5 mg at bedtime    Dispense:  60 tablet    Refill:  0    Partially dictated using Editor, commissioning. Any errors are unintentional.  Halina Maidens, MD [facility]  06/21/2017   There are no diagnoses linked to this encounter.

## 2017-08-05 ENCOUNTER — Encounter: Payer: Self-pay | Admitting: Internal Medicine

## 2017-08-07 DIAGNOSIS — G43109 Migraine with aura, not intractable, without status migrainosus: Secondary | ICD-10-CM | POA: Diagnosis not present

## 2017-08-09 DIAGNOSIS — M542 Cervicalgia: Secondary | ICD-10-CM | POA: Diagnosis not present

## 2017-08-24 ENCOUNTER — Other Ambulatory Visit: Payer: Self-pay | Admitting: Internal Medicine

## 2017-08-26 NOTE — Telephone Encounter (Signed)
Called patient and spoke with her about this. Transferred her to front desk to schedule her physical appt.

## 2017-09-25 DIAGNOSIS — M9902 Segmental and somatic dysfunction of thoracic region: Secondary | ICD-10-CM | POA: Diagnosis not present

## 2017-09-25 DIAGNOSIS — M546 Pain in thoracic spine: Secondary | ICD-10-CM | POA: Diagnosis not present

## 2017-10-09 ENCOUNTER — Other Ambulatory Visit: Payer: Self-pay | Admitting: Internal Medicine

## 2017-10-17 DIAGNOSIS — L508 Other urticaria: Secondary | ICD-10-CM | POA: Diagnosis not present

## 2017-10-30 ENCOUNTER — Ambulatory Visit
Admission: RE | Admit: 2017-10-30 | Discharge: 2017-10-30 | Disposition: A | Discharge status: Home / Self Care | Payer: BLUE CROSS/BLUE SHIELD | Source: Ambulatory Visit | Attending: Internal Medicine | Admitting: Internal Medicine

## 2017-10-30 ENCOUNTER — Encounter: Payer: Self-pay | Admitting: Internal Medicine

## 2017-10-30 ENCOUNTER — Ambulatory Visit: Payer: BLUE CROSS/BLUE SHIELD | Admitting: Internal Medicine

## 2017-10-30 VITALS — BP 138/72 | HR 68 | Ht 66.0 in | Wt 232.0 lb

## 2017-10-30 DIAGNOSIS — R222 Localized swelling, mass and lump, trunk: Secondary | ICD-10-CM | POA: Diagnosis not present

## 2017-10-30 DIAGNOSIS — M542 Cervicalgia: Secondary | ICD-10-CM | POA: Diagnosis not present

## 2017-10-30 DIAGNOSIS — Z23 Encounter for immunization: Secondary | ICD-10-CM

## 2017-10-30 DIAGNOSIS — R0789 Other chest pain: Secondary | ICD-10-CM | POA: Diagnosis not present

## 2017-10-30 MED ORDER — TIZANIDINE HCL 4 MG PO TABS: 4.0000 mg | ORAL_TABLET | Freq: Every day | ORAL | 0 refills | Status: DC | PRN

## 2017-10-30 NOTE — Progress Notes (Signed)
Date:  10/30/2017   Name:  Gabrielle Tucker   DOB:  Sep 21, 1960   MRN:  [id number]   Chief Complaint: Neck Pain (Neck is swollen and painful. Started 3-4 months ago. Seen orthopedics- said looked okay where had surgery 13 years ago. Had prednisone and muscle relaxer and did not help. Swelling with pain todday. Pain is constant. ) and Immunizations (Flu shot.)  She has pain on the left side of her neck - initially in the back and now has moved anteriorly with some swelling.  She has no injury.  She was seen by Ortho - cervical spine films showed intact hardware from previous fusioin and no abnormality.  She went to the chiropractor which actually was worse. She tried a cortisone taper and muscle relaxant with no benefit. She has pain - dull aching - constantly but worse if she turns her head and when she moves around in bed.  No arm sx.  No other symptoms.  She is a non smoker. Review of Systems  Constitutional: Negative for chills, fatigue and fever.  HENT: Negative for dental problem, ear pain, sinus pressure and sore throat.   Respiratory: Negative for cough, chest tightness, shortness of breath and wheezing.   Cardiovascular: Positive for chest pain (upper left chest and collar bone region). Negative for palpitations and leg swelling.  Gastrointestinal: Negative for constipation and diarrhea.  Musculoskeletal: Positive for myalgias, neck pain and neck stiffness.  Hematological: Negative for adenopathy.  Psychiatric/Behavioral: Positive for sleep disturbance.    Patient Active Problem List   Diagnosis Date Noted  . Hyperlipidemia, mild 08/03/2016  . Arthritis of knee, left 08/02/2016  . Back muscle spasm 07/03/2014  . H/O peptic ulcer 07/03/2014  . Migraine without aura and responsive to treatment 07/03/2014  . Muscle contraction headache 07/03/2014  . Idiopathic insomnia 07/03/2014  . Tachycardia 07/03/2014    No Known Allergies  Past Surgical History:  Procedure  Laterality Date  . ABDOMINAL HYSTERECTOMY    . BREAST REDUCTION SURGERY    . ESOPHAGOGASTRODUODENOSCOPY  2011   gastric ulcer  . LITHOTRIPSY  2012   with setnet  . LUMBAR DISC SURGERY    . NASAL SINUS SURGERY  2015  . partail knee replacement Right   . REDUCTION MAMMAPLASTY Bilateral 2011    Social History   Tobacco Use  . Smoking status: Never Smoker  . Smokeless tobacco: Never Used  Substance Use Topics  . Alcohol use: Yes    Alcohol/week: 2.0 standard drinks    Types: 2 Standard drinks or equivalent per week  . Drug use: Not on file     Medication list has been reviewed and updated.  Current Meds  Medication Sig  . clonazePAM (KLONOPIN) 0.5 MG tablet Take 0.5 tablets (0.25 mg total) by mouth 2 (two) times daily as needed for anxiety (and insomnia). And 0.5 mg at bedtime  . escitalopram (LEXAPRO) 10 MG tablet Take 1 tablet (10 mg total) by mouth daily.  . fluticasone (FLONASE) 50 MCG/ACT nasal spray Place 2 sprays into both nostrils daily.  . metoprolol succinate (TOPROL-XL) 50 MG 24 hr tablet TAKE 1 TABLET BY MOUTH EVERY DAY  . pantoprazole (PROTONIX) 40 MG tablet TAKE 1 TABLET(40 MG) BY MOUTH TWICE DAILY  . tiZANidine (ZANAFLEX) 4 MG tablet TAKE 1 TABLET(4 MG) BY MOUTH DAILY AS NEEDED  . zaleplon (SONATA) 10 MG capsule TAKE 1 CAPSULE BY MOUTH AT BEDTIME AS NEEDED FOR SLEEP   Current Facility-Administered Medications for the 10/30/17  encounter (Office Visit) with Glean Hess, MD  Medication  . betamethasone acetate-betamethasone sodium phosphate (CELESTONE) injection 3 mg    PHQ 2/9 Scores 06/21/2017 04/15/2017 08/02/2016  PHQ - 2 Score 4 5 1   PHQ- 9 Score 22 17 -    Physical Exam  Constitutional: She is oriented to person, place, and time. She appears well-developed. No distress.  HENT:  Head: Normocephalic and atraumatic.  Neck: Trachea normal. Carotid bruit is not present.    ROM causes pain on the left in all directions  Cardiovascular: Normal rate,  regular rhythm and normal heart sounds.  Pulmonary/Chest: Effort normal and breath sounds normal. No respiratory distress.  Musculoskeletal: Normal range of motion.  Lymphadenopathy:    She has no cervical adenopathy.    She has no axillary adenopathy.  Neurological: She is alert and oriented to person, place, and time.  Skin: Skin is warm and dry. No rash noted.  Psychiatric: She has a normal mood and affect. Her behavior is normal. Thought content normal.  Nursing note and vitals reviewed.   BP 138/72 (BP Location: Right Arm, Patient Position: Sitting, Cuff Size: Normal)   Pulse 68   Ht 5\' 6"  (1.676 m)   Wt 232 lb (105.2 kg)   SpO2 97%   BMI 37.45 kg/m   Assessment and Plan: 1. Neck pain on left side Will get labs and CXR then likely will need CT soft tissues of neck - CBC with Differential/Platelet - TSH - DG Chest 2 View; Future - tiZANidine (ZANAFLEX) 4 MG tablet; Take 1 tablet (4 mg total) by mouth daily as needed for muscle spasms.  Dispense: 90 tablet; Refill: 0   Partially dictated using Editor, commissioning. Any errors are unintentional.  Halina Maidens, MD [facility]  10/30/2017

## 2017-10-31 LAB — CBC WITH DIFFERENTIAL/PLATELET
BASOS ABS: 0 10*3/uL (ref 0.0–0.2)
Basos: 0 %
EOS (ABSOLUTE): 0.2 10*3/uL (ref 0.0–0.4)
Eos: 2 %
Hematocrit: 40.3 % (ref 34.0–46.6)
Hemoglobin: 13.5 g/dL (ref 11.1–15.9)
IMMATURE GRANS (ABS): 0 10*3/uL (ref 0.0–0.1)
Immature Granulocytes: 0 %
LYMPHS ABS: 2.8 10*3/uL (ref 0.7–3.1)
Lymphs: 32 %
MCH: 30.8 pg (ref 26.6–33.0)
MCHC: 33.5 g/dL (ref 31.5–35.7)
MCV: 92 fL (ref 79–97)
MONOCYTES: 6 %
Monocytes Absolute: 0.5 10*3/uL (ref 0.1–0.9)
NEUTROS ABS: 5.3 10*3/uL (ref 1.4–7.0)
Neutrophils: 60 %
PLATELETS: 216 10*3/uL (ref 150–450)
RBC: 4.38 x10E6/uL (ref 3.77–5.28)
RDW: 12.6 % (ref 12.3–15.4)
WBC: 8.8 10*3/uL (ref 3.4–10.8)

## 2017-10-31 LAB — TSH: TSH: 2.74 u[IU]/mL (ref 0.450–4.500)

## 2017-11-04 ENCOUNTER — Telehealth: Payer: Self-pay

## 2017-11-04 ENCOUNTER — Other Ambulatory Visit: Payer: Self-pay | Admitting: Internal Medicine

## 2017-11-04 DIAGNOSIS — R221 Localized swelling, mass and lump, neck: Secondary | ICD-10-CM

## 2017-11-04 NOTE — Telephone Encounter (Signed)
Patient called stating she wanted the CT for her neck to be ordered. ( View last XRAY ) Her neck is still swollen and she is in a lot of pain- no better.  Please Advise.  CB#- [phone]

## 2017-11-04 NOTE — Telephone Encounter (Signed)
Patient informed. Will await call from scheduling for CT.

## 2017-11-04 NOTE — Telephone Encounter (Signed)
CT ordered.  Scheduling will be calling with the appointment.

## 2017-11-07 ENCOUNTER — Ambulatory Visit
Admission: RE | Admit: 2017-11-07 | Discharge: 2017-11-07 | Disposition: A | Discharge status: Home / Self Care | Payer: BLUE CROSS/BLUE SHIELD | Source: Ambulatory Visit | Attending: Internal Medicine | Admitting: Internal Medicine

## 2017-11-07 ENCOUNTER — Other Ambulatory Visit: Payer: Self-pay | Admitting: Internal Medicine

## 2017-11-07 DIAGNOSIS — R221 Localized swelling, mass and lump, neck: Secondary | ICD-10-CM | POA: Diagnosis not present

## 2017-11-07 DIAGNOSIS — E041 Nontoxic single thyroid nodule: Secondary | ICD-10-CM

## 2017-11-07 DIAGNOSIS — Z981 Arthrodesis status: Secondary | ICD-10-CM | POA: Diagnosis not present

## 2017-11-07 DIAGNOSIS — E042 Nontoxic multinodular goiter: Secondary | ICD-10-CM | POA: Insufficient documentation

## 2017-11-07 MED ORDER — IOHEXOL 300 MG/ML  SOLN
75.0000 mL | Freq: Once | INTRAMUSCULAR | Status: AC | PRN
  Administered 2017-11-07: 75 mL via INTRAVENOUS

## 2017-11-11 ENCOUNTER — Ambulatory Visit
Admission: RE | Admit: 2017-11-11 | Discharge: 2017-11-11 | Disposition: A | Discharge status: Home / Self Care | Payer: BLUE CROSS/BLUE SHIELD | Source: Ambulatory Visit | Attending: Internal Medicine | Admitting: Internal Medicine

## 2017-11-11 DIAGNOSIS — E042 Nontoxic multinodular goiter: Secondary | ICD-10-CM | POA: Diagnosis not present

## 2017-11-11 DIAGNOSIS — E041 Nontoxic single thyroid nodule: Secondary | ICD-10-CM | POA: Diagnosis not present

## 2017-11-12 ENCOUNTER — Other Ambulatory Visit: Payer: Self-pay | Admitting: Internal Medicine

## 2017-11-12 DIAGNOSIS — E042 Nontoxic multinodular goiter: Secondary | ICD-10-CM | POA: Insufficient documentation

## 2017-11-12 DIAGNOSIS — E041 Nontoxic single thyroid nodule: Secondary | ICD-10-CM

## 2017-11-12 NOTE — Progress Notes (Signed)
Patient called and seen labs on my chart. Stated she would like referral placed for Dr Kathyrn Sheriff to have further eval with biopsy.

## 2017-11-13 NOTE — Progress Notes (Signed)
Patient informed. 

## 2017-11-14 DIAGNOSIS — E041 Nontoxic single thyroid nodule: Secondary | ICD-10-CM | POA: Diagnosis not present

## 2017-11-14 DIAGNOSIS — E079 Disorder of thyroid, unspecified: Secondary | ICD-10-CM | POA: Diagnosis not present

## 2017-11-18 ENCOUNTER — Other Ambulatory Visit: Payer: Self-pay | Admitting: Physician Assistant

## 2017-11-18 ENCOUNTER — Telehealth: Payer: Self-pay

## 2017-11-18 DIAGNOSIS — E041 Nontoxic single thyroid nodule: Secondary | ICD-10-CM

## 2017-11-18 NOTE — Telephone Encounter (Signed)
Still in pain and requesting RX be sent to relive pain until we figure this out.

## 2017-11-18 NOTE — Telephone Encounter (Signed)
I recommend that she take Advil 800 mg three times a day with food OR Aleve 220 mg - 2 tablets twice a day with food.  I can not send in controlled medication without seeing her again.  When is her specialist visit?

## 2017-11-19 NOTE — Telephone Encounter (Signed)
Called and spoke with patient. She verbalized understanding of this. Seen specialist last Thursday.  Will call back and schedule appt if aleve or Advil does not give relief

## 2017-11-19 NOTE — Telephone Encounter (Signed)
She can also ask Dr. Kathyrn Sheriff for advise on pain relief.

## 2017-11-25 ENCOUNTER — Encounter: Payer: Self-pay | Admitting: Internal Medicine

## 2017-11-25 ENCOUNTER — Ambulatory Visit: Payer: BLUE CROSS/BLUE SHIELD | Admitting: Internal Medicine

## 2017-11-25 VITALS — BP 144/74 | HR 71 | Ht 66.0 in | Wt 234.4 lb

## 2017-11-25 DIAGNOSIS — M542 Cervicalgia: Secondary | ICD-10-CM | POA: Diagnosis not present

## 2017-11-25 MED ORDER — TRAMADOL HCL 50 MG PO TABS: 50.0000 mg | ORAL_TABLET | Freq: Four times a day (QID) | ORAL | 0 refills | Status: DC | PRN

## 2017-11-25 NOTE — Progress Notes (Signed)
Date:  11/25/2017   Name:  Gabrielle Tucker   DOB:  28-Aug-1960   MRN:  [id number]   Chief Complaint: Neck Pain (Pain has gotten worse. Seen PA- She went over report and "that was it." Having biopsy done on Wednesday.  )  Neck Pain   This is a new problem. The current episode started 1 to 4 weeks ago. The problem occurs constantly. The problem has been gradually worsening. The pain is associated with nothing. The pain is present in the left side. The quality of the pain is described as aching. The pain is moderate. The symptoms are aggravated by twisting. The pain is worse during the night. Pertinent negatives include no chest pain, fever or trouble swallowing. She has tried NSAIDs and acetaminophen for the symptoms. The treatment provided no relief.    Review of Systems  Constitutional: Negative for chills, fatigue and fever.  HENT: Negative for trouble swallowing.   Respiratory: Negative for choking, chest tightness and shortness of breath.   Cardiovascular: Negative for chest pain and palpitations.  Musculoskeletal: Positive for arthralgias and neck pain.  Psychiatric/Behavioral: Negative for dysphoric mood and sleep disturbance.    Patient Active Problem List   Diagnosis Date Noted  . Multiple thyroid nodules 11/12/2017  . Hyperlipidemia, mild 08/03/2016  . Arthritis of knee, left 08/02/2016  . Back muscle spasm 07/03/2014  . H/O peptic ulcer 07/03/2014  . Migraine without aura and responsive to treatment 07/03/2014  . Muscle contraction headache 07/03/2014  . Idiopathic insomnia 07/03/2014  . Tachycardia 07/03/2014    No Known Allergies  Past Surgical History:  Procedure Laterality Date  . ABDOMINAL HYSTERECTOMY    . ANTERIOR FUSION CERVICAL SPINE  2006  . BREAST REDUCTION SURGERY    . ESOPHAGOGASTRODUODENOSCOPY  2011   gastric ulcer  . LITHOTRIPSY  2012   with setnet  . LUMBAR DISC SURGERY    . NASAL SINUS SURGERY  2015  . partail knee replacement Right   .  REDUCTION MAMMAPLASTY Bilateral 2011    Social History   Tobacco Use  . Smoking status: Never Smoker  . Smokeless tobacco: Never Used  Substance Use Topics  . Alcohol use: Yes    Alcohol/week: 2.0 standard drinks    Types: 2 Standard drinks or equivalent per week  . Drug use: Not on file     Medication list has been reviewed and updated.  Current Meds  Medication Sig  . clonazePAM (KLONOPIN) 0.5 MG tablet Take 0.5 tablets (0.25 mg total) by mouth 2 (two) times daily as needed for anxiety (and insomnia). And 0.5 mg at bedtime  . escitalopram (LEXAPRO) 10 MG tablet Take 1 tablet (10 mg total) by mouth daily.  . fluticasone (FLONASE) 50 MCG/ACT nasal spray Place 2 sprays into both nostrils daily.  . metoprolol succinate (TOPROL-XL) 50 MG 24 hr tablet TAKE 1 TABLET BY MOUTH EVERY DAY  . pantoprazole (PROTONIX) 40 MG tablet TAKE 1 TABLET(40 MG) BY MOUTH TWICE DAILY  . tiZANidine (ZANAFLEX) 4 MG tablet Take 1 tablet (4 mg total) by mouth daily as needed for muscle spasms.  . zaleplon (SONATA) 10 MG capsule TAKE 1 CAPSULE BY MOUTH AT BEDTIME AS NEEDED FOR SLEEP   Current Facility-Administered Medications for the 11/25/17 encounter (Office Visit) with Glean Hess, MD  Medication  . betamethasone acetate-betamethasone sodium phosphate (CELESTONE) injection 3 mg    PHQ 2/9 Scores 06/21/2017 04/15/2017 08/02/2016  PHQ - 2 Score 4 5 1   PHQ- 9  Score 22 17 -    Physical Exam  Constitutional: She is oriented to person, place, and time. She appears well-developed. She appears distressed.  HENT:  Head: Normocephalic and atraumatic.  Cardiovascular: Normal rate, regular rhythm and normal heart sounds.  Pulmonary/Chest: Effort normal and breath sounds normal. No respiratory distress.  Musculoskeletal:       Cervical back: She exhibits decreased range of motion, tenderness and bony tenderness. She exhibits no spasm and normal pulse.  Neurological: She is alert and oriented to person,  place, and time. She has normal strength and normal reflexes.  Skin: Skin is warm and dry. No rash noted.  Psychiatric: She has a normal mood and affect. Her behavior is normal. Thought content normal.  Nursing note and vitals reviewed.   BP (!) 144/74 (BP Location: Right Arm, Patient Position: Sitting, Cuff Size: Large)   Pulse 71   Ht 5\' 6"  (1.676 m)   Wt 234 lb 6.4 oz (106.3 kg)   SpO2 97%   BMI 37.83 kg/m   Assessment and Plan: 1. Neck pain on left side May be nerve impingement related to prior ACF Stop Advil, continue tylenol Await thyroid bx; may need MRI - traMADol (ULTRAM) 50 MG tablet; Take 1-2 tablets (50-100 mg total) by mouth every 6 (six) hours as needed.  Dispense: 40 tablet; Refill: 0   Partially dictated using Editor, commissioning. Any errors are unintentional.  Halina Maidens, MD [facility]  11/25/2017

## 2017-11-27 ENCOUNTER — Other Ambulatory Visit: Payer: Self-pay | Admitting: Physician Assistant

## 2017-11-27 ENCOUNTER — Ambulatory Visit
Admission: RE | Admit: 2017-11-27 | Discharge: 2017-11-27 | Disposition: A | Discharge status: Home / Self Care | Payer: BLUE CROSS/BLUE SHIELD | Source: Ambulatory Visit | Attending: Physician Assistant | Admitting: Physician Assistant

## 2017-11-27 DIAGNOSIS — E041 Nontoxic single thyroid nodule: Secondary | ICD-10-CM

## 2017-11-27 DIAGNOSIS — E042 Nontoxic multinodular goiter: Secondary | ICD-10-CM | POA: Diagnosis not present

## 2017-11-27 NOTE — Procedures (Signed)
Pre Procedure Dx: Indeterminate thyroid nodules Post Procedural Dx: Same  Technically successful US guided biopsy of indeterminate 2.6 cm TR3 nodule within the superior pole of the left lobe of the thyroid (labeled #2)  Technically successful US guided biopsy of indeterminate 1.6 cm TR4 nodule within the superior pole of the left lobe of the thyroid (labeled #3)  EBL: None  No immediate complications.   Ronny Bacon, MD Pager #: [phone]

## 2017-11-30 DIAGNOSIS — D34 Benign neoplasm of thyroid gland: Secondary | ICD-10-CM | POA: Diagnosis not present

## 2017-12-10 DIAGNOSIS — E041 Nontoxic single thyroid nodule: Secondary | ICD-10-CM | POA: Diagnosis not present

## 2017-12-11 ENCOUNTER — Encounter: Payer: Self-pay | Admitting: Internal Medicine

## 2017-12-11 DIAGNOSIS — C73 Malignant neoplasm of thyroid gland: Secondary | ICD-10-CM | POA: Insufficient documentation

## 2017-12-11 HISTORY — DX: Malignant neoplasm of thyroid gland: C73

## 2017-12-12 ENCOUNTER — Other Ambulatory Visit: Payer: Self-pay

## 2017-12-12 ENCOUNTER — Encounter
Admission: RE | Admit: 2017-12-12 | Discharge: 2017-12-12 | Disposition: A | Discharge status: Home / Self Care | Payer: BLUE CROSS/BLUE SHIELD | Source: Ambulatory Visit | Attending: Otolaryngology | Admitting: Otolaryngology

## 2017-12-12 ENCOUNTER — Encounter: Payer: Self-pay | Admitting: Otolaryngology

## 2017-12-12 DIAGNOSIS — Z01812 Encounter for preprocedural laboratory examination: Secondary | ICD-10-CM | POA: Insufficient documentation

## 2017-12-12 HISTORY — DX: Cardiac arrhythmia, unspecified: I49.9

## 2017-12-12 HISTORY — DX: Depression, unspecified: F32.A

## 2017-12-12 HISTORY — DX: Gastric ulcer, unspecified as acute or chronic, without hemorrhage or perforation: K25.9

## 2017-12-12 HISTORY — DX: Major depressive disorder, single episode, unspecified: F32.9

## 2017-12-12 LAB — BASIC METABOLIC PANEL
ANION GAP: 6 (ref 5–15)
BUN: 12 mg/dL (ref 6–20)
CHLORIDE: 102 mmol/L (ref 98–111)
CO2: 31 mmol/L (ref 22–32)
Calcium: 9.4 mg/dL (ref 8.9–10.3)
Creatinine, Ser: 0.73 mg/dL (ref 0.44–1.00)
GFR calc Af Amer: >60 mL/min (ref >60)
GLUCOSE: 102 mg/dL — AB (ref 70–99)
POTASSIUM: 4.3 mmol/L (ref 3.5–5.1)
Sodium: 139 mmol/L (ref 135–145)

## 2017-12-12 LAB — CYTOLOGY - NON PAP

## 2017-12-12 NOTE — Patient Instructions (Signed)
Your procedure is scheduled on: Mon. 12/16/17 Report to Day Surgery. To find out your arrival time please call [phone] between 1PM - 3PM on Friday.11/22  Remember: Instructions that are not followed completely may result in serious medical risk,  up to and including death, or upon the discretion of your surgeon and anesthesiologist your  surgery may need to be rescheduled.     _X__ 1. Do not eat food after midnight the night before your procedure.                 No gum chewing or hard candies. You may drink clear liquids up to 2 hours                 before you are scheduled to arrive for your surgery- DO not drink clear                 liquids within 2 hours of the start of your surgery.                 Clear Liquids include:  water, apple juice without pulp, clear carbohydrate                 drink such as Clearfast of Gatorade, Black Coffee or Tea (Do not add                 anything to coffee or tea).  __X__2.  On the morning of surgery brush your teeth with toothpaste and water, you                may rinse your mouth with mouthwash if you wish.  Do not swallow any toothpaste of mouthwash.     _X__ 3.  No Alcohol for 24 hours before or after surgery.   ___ 4.  Do Not Smoke or use e-cigarettes For 24 Hours Prior to Your Surgery.                 Do not use any chewable tobacco products for at least 6 hours prior to                 surgery.  ____  5.  Bring all medications with you on the day of surgery if instructed.   __x3__  6.  Notify your doctor if there is any change in your medical condition      (cold, fever, infections).     Do not wear jewelry, make-up, hairpins, clips or nail polish. Do not wear lotions, powders, or perfumes. You may wear deodorant. Do not shave 48 hours prior to surgery. Men may shave face and neck. Do not bring valuables to the hospital.    [facility] is not responsible for any belongings or valuables.  Contacts,  dentures or bridgework may not be worn into surgery. Leave your suitcase in the car. After surgery it may be brought to your room. For patients admitted to the hospital, discharge time is determined by your treatment team.   Patients discharged the day of surgery will not be allowed to drive home.   Please read over the following fact sheets that you were given:     __x__ Take these medicines the morning of surgery with A SIP OF WATER:    1. escitalopram (LEXAPRO) 10 MG tablet  2. pantoprazole (PROTONIX) 40 MG tablet  3. traMADol (ULTRAM) 50 MG tablet if needed  4.  5.  6.  ____ Fleet Enema (as directed)  __x__ Use CHG Soap as directed  ____ Use inhalers on the day of surgery  ____ Stop metformin 2 days prior to surgery    ____ Take 1/2 of usual insulin dose the night before surgery. No insulin the morning          of surgery.   ____ Stop Coumadin/Plavix/aspirin on   ____ Stop Anti-inflammatories on    ____ Stop supplements until after surgery.    ____ Bring C-Pap to the hospital.

## 2017-12-13 ENCOUNTER — Encounter: Payer: Self-pay | Admitting: Anesthesiology

## 2017-12-16 ENCOUNTER — Other Ambulatory Visit: Payer: Self-pay

## 2017-12-16 ENCOUNTER — Observation Stay
Admission: RE | Admit: 2017-12-16 | Discharge: 2017-12-17 | Disposition: A | Discharge status: Home / Self Care | Payer: BLUE CROSS/BLUE SHIELD | Source: Ambulatory Visit | Attending: Otolaryngology | Admitting: Otolaryngology

## 2017-12-16 ENCOUNTER — Ambulatory Visit: Payer: BLUE CROSS/BLUE SHIELD | Admitting: Anesthesiology

## 2017-12-16 ENCOUNTER — Encounter: Payer: Self-pay | Admitting: *Deleted

## 2017-12-16 ENCOUNTER — Encounter
Admission: RE | Disposition: A | Discharge status: Home / Self Care | Payer: Self-pay | Source: Ambulatory Visit | Attending: Otolaryngology

## 2017-12-16 DIAGNOSIS — E890 Postprocedural hypothyroidism: Secondary | ICD-10-CM

## 2017-12-16 DIAGNOSIS — F329 Major depressive disorder, single episode, unspecified: Secondary | ICD-10-CM | POA: Insufficient documentation

## 2017-12-16 DIAGNOSIS — C73 Malignant neoplasm of thyroid gland: Secondary | ICD-10-CM | POA: Diagnosis not present

## 2017-12-16 DIAGNOSIS — E042 Nontoxic multinodular goiter: Secondary | ICD-10-CM | POA: Diagnosis not present

## 2017-12-16 DIAGNOSIS — I471 Supraventricular tachycardia: Secondary | ICD-10-CM | POA: Insufficient documentation

## 2017-12-16 DIAGNOSIS — E785 Hyperlipidemia, unspecified: Secondary | ICD-10-CM | POA: Diagnosis not present

## 2017-12-16 DIAGNOSIS — E041 Nontoxic single thyroid nodule: Secondary | ICD-10-CM | POA: Diagnosis not present

## 2017-12-16 DIAGNOSIS — E669 Obesity, unspecified: Secondary | ICD-10-CM | POA: Diagnosis not present

## 2017-12-16 DIAGNOSIS — E063 Autoimmune thyroiditis: Secondary | ICD-10-CM | POA: Insufficient documentation

## 2017-12-16 DIAGNOSIS — Z79899 Other long term (current) drug therapy: Secondary | ICD-10-CM | POA: Insufficient documentation

## 2017-12-16 HISTORY — PX: THYROIDECTOMY: SHX17

## 2017-12-16 SURGERY — THYROIDECTOMY
Anesthesia: General | Laterality: Bilateral

## 2017-12-16 MED ORDER — BACITRACIN 500 UNIT/GM EX OINT
TOPICAL_OINTMENT | CUTANEOUS | Status: DC | PRN
  Administered 2017-12-16: 1 via TOPICAL

## 2017-12-16 MED ORDER — ESCITALOPRAM OXALATE 10 MG PO TABS
10.0000 mg | ORAL_TABLET | Freq: Every day | ORAL | Status: DC
  Administered 2017-12-17: 10 mg via ORAL
  Filled 2017-12-16 (×2): qty 1

## 2017-12-16 MED ORDER — BISACODYL 5 MG PO TBEC: 5.0000 mg | DELAYED_RELEASE_TABLET | Freq: Every day | ORAL | Status: DC | PRN

## 2017-12-16 MED ORDER — FLEET ENEMA 7-19 GM/118ML RE ENEM: 1.0000 | ENEMA | Freq: Once | RECTAL | Status: DC | PRN

## 2017-12-16 MED ORDER — PROPOFOL 10 MG/ML IV BOLUS
INTRAVENOUS | Status: DC | PRN
  Administered 2017-12-16: 120 mg via INTRAVENOUS

## 2017-12-16 MED ORDER — MAGNESIUM HYDROXIDE 400 MG/5ML PO SUSP
30.0000 mL | Freq: Every day | ORAL | Status: DC | PRN
  Filled 2017-12-16: qty 30

## 2017-12-16 MED ORDER — MIDAZOLAM HCL 2 MG/2ML IJ SOLN
INTRAMUSCULAR | Status: DC | PRN
  Administered 2017-12-16: 2 mg via INTRAVENOUS

## 2017-12-16 MED ORDER — LACTATED RINGERS IV SOLN
INTRAVENOUS | Status: DC
  Administered 2017-12-16: 07:00:00 via INTRAVENOUS

## 2017-12-16 MED ORDER — ACETAMINOPHEN 650 MG RE SUPP: 650.0000 mg | Freq: Four times a day (QID) | RECTAL | Status: DC | PRN

## 2017-12-16 MED ORDER — PROMETHAZINE HCL 25 MG PO TABS
25.0000 mg | ORAL_TABLET | Freq: Four times a day (QID) | ORAL | Status: DC | PRN
  Administered 2017-12-16 – 2017-12-17 (×3): 25 mg via ORAL
  Filled 2017-12-16 (×3): qty 1

## 2017-12-16 MED ORDER — FENTANYL CITRATE (PF) 100 MCG/2ML IJ SOLN
INTRAMUSCULAR | Status: AC
  Filled 2017-12-16: qty 2

## 2017-12-16 MED ORDER — DEXAMETHASONE SODIUM PHOSPHATE 10 MG/ML IJ SOLN
INTRAMUSCULAR | Status: AC
  Filled 2017-12-16: qty 1

## 2017-12-16 MED ORDER — PROPOFOL 10 MG/ML IV BOLUS
INTRAVENOUS | Status: AC
  Filled 2017-12-16: qty 20

## 2017-12-16 MED ORDER — HYDROCODONE-ACETAMINOPHEN 5-325 MG PO TABS
1.0000 | ORAL_TABLET | ORAL | Status: DC | PRN
  Administered 2017-12-16 – 2017-12-17 (×3): 2 via ORAL
  Filled 2017-12-16 (×3): qty 2

## 2017-12-16 MED ORDER — LIDOCAINE-EPINEPHRINE (PF) 1 %-1:200000 IJ SOLN
INTRAMUSCULAR | Status: DC | PRN
  Administered 2017-12-16: 9 mL

## 2017-12-16 MED ORDER — REMIFENTANIL HCL 1 MG IV SOLR
INTRAVENOUS | Status: AC
  Filled 2017-12-16: qty 1000

## 2017-12-16 MED ORDER — CALCIUM CARBONATE-VITAMIN D 500-200 MG-UNIT PO TABS
2.0000 | ORAL_TABLET | Freq: Two times a day (BID) | ORAL | Status: DC
  Administered 2017-12-16 – 2017-12-17 (×3): 2 via ORAL
  Filled 2017-12-16 (×3): qty 2

## 2017-12-16 MED ORDER — ONDANSETRON HCL 4 MG/2ML IJ SOLN
INTRAMUSCULAR | Status: AC
  Filled 2017-12-16: qty 2

## 2017-12-16 MED ORDER — ONDANSETRON HCL 4 MG/2ML IJ SOLN: 4.0000 mg | Freq: Once | INTRAMUSCULAR | Status: DC | PRN

## 2017-12-16 MED ORDER — LIDOCAINE-EPINEPHRINE (PF) 1 %-1:200000 IJ SOLN
INTRAMUSCULAR | Status: AC
  Filled 2017-12-16: qty 10

## 2017-12-16 MED ORDER — REMIFENTANIL HCL 1 MG IV SOLR
INTRAVENOUS | Status: DC | PRN
  Administered 2017-12-16: .1 ug/kg/min via INTRAVENOUS

## 2017-12-16 MED ORDER — SUCCINYLCHOLINE CHLORIDE 20 MG/ML IJ SOLN
INTRAMUSCULAR | Status: AC
  Filled 2017-12-16: qty 1

## 2017-12-16 MED ORDER — LIDOCAINE HCL (PF) 2 % IJ SOLN
INTRAMUSCULAR | Status: AC
  Filled 2017-12-16: qty 10

## 2017-12-16 MED ORDER — TIZANIDINE HCL 4 MG PO TABS
4.0000 mg | ORAL_TABLET | Freq: Every day | ORAL | Status: DC | PRN
  Filled 2017-12-16: qty 1

## 2017-12-16 MED ORDER — GLYCOPYRROLATE 0.2 MG/ML IJ SOLN
INTRAMUSCULAR | Status: DC | PRN
  Administered 2017-12-16: 0.2 mg via INTRAVENOUS

## 2017-12-16 MED ORDER — PHENYLEPHRINE HCL 10 MG/ML IJ SOLN
INTRAMUSCULAR | Status: DC | PRN
  Administered 2017-12-16: 200 ug via INTRAVENOUS
  Administered 2017-12-16 (×3): 100 ug via INTRAVENOUS

## 2017-12-16 MED ORDER — EPHEDRINE SULFATE 50 MG/ML IJ SOLN
INTRAMUSCULAR | Status: DC | PRN
  Administered 2017-12-16 (×2): 10 mg via INTRAVENOUS
  Administered 2017-12-16: 5 mg via INTRAVENOUS
  Administered 2017-12-16: 10 mg via INTRAVENOUS

## 2017-12-16 MED ORDER — SUCCINYLCHOLINE CHLORIDE 20 MG/ML IJ SOLN
INTRAMUSCULAR | Status: DC | PRN
  Administered 2017-12-16: 120 mg via INTRAVENOUS

## 2017-12-16 MED ORDER — DEXAMETHASONE SODIUM PHOSPHATE 10 MG/ML IJ SOLN
INTRAMUSCULAR | Status: DC | PRN
  Administered 2017-12-16: 5 mg via INTRAVENOUS

## 2017-12-16 MED ORDER — ONDANSETRON 4 MG PO TBDP: 4.0000 mg | ORAL_TABLET | Freq: Four times a day (QID) | ORAL | Status: DC | PRN

## 2017-12-16 MED ORDER — ONDANSETRON HCL 4 MG/2ML IJ SOLN: 4.0000 mg | Freq: Four times a day (QID) | INTRAMUSCULAR | Status: DC | PRN

## 2017-12-16 MED ORDER — TRAMADOL HCL 50 MG PO TABS
50.0000 mg | ORAL_TABLET | Freq: Four times a day (QID) | ORAL | Status: DC | PRN
  Administered 2017-12-16: 100 mg via ORAL
  Filled 2017-12-16: qty 2

## 2017-12-16 MED ORDER — FENTANYL CITRATE (PF) 100 MCG/2ML IJ SOLN
INTRAMUSCULAR | Status: DC | PRN
  Administered 2017-12-16: 100 ug via INTRAVENOUS

## 2017-12-16 MED ORDER — LIDOCAINE HCL (CARDIAC) PF 100 MG/5ML IV SOSY
PREFILLED_SYRINGE | INTRAVENOUS | Status: DC | PRN
  Administered 2017-12-16: 80 mg via INTRAVENOUS

## 2017-12-16 MED ORDER — MIDAZOLAM HCL 2 MG/2ML IJ SOLN
INTRAMUSCULAR | Status: AC
  Filled 2017-12-16: qty 2

## 2017-12-16 MED ORDER — FENTANYL CITRATE (PF) 100 MCG/2ML IJ SOLN
25.0000 ug | INTRAMUSCULAR | Status: DC | PRN
  Administered 2017-12-16 (×3): 25 ug via INTRAVENOUS

## 2017-12-16 MED ORDER — ACETAMINOPHEN 325 MG PO TABS
650.0000 mg | ORAL_TABLET | Freq: Four times a day (QID) | ORAL | Status: DC | PRN
  Administered 2017-12-16: 650 mg via ORAL
  Filled 2017-12-16: qty 2

## 2017-12-16 MED ORDER — ZOLPIDEM TARTRATE 5 MG PO TABS: 5.0000 mg | ORAL_TABLET | Freq: Every evening | ORAL | Status: DC | PRN

## 2017-12-16 MED ORDER — DEXTROSE-NACL 5-0.45 % IV SOLN
INTRAVENOUS | Status: DC
  Administered 2017-12-16 – 2017-12-17 (×2): via INTRAVENOUS

## 2017-12-16 MED ORDER — EPHEDRINE SULFATE 50 MG/ML IJ SOLN
INTRAMUSCULAR | Status: AC
  Filled 2017-12-16: qty 1

## 2017-12-16 MED ORDER — FENTANYL CITRATE (PF) 100 MCG/2ML IJ SOLN
INTRAMUSCULAR | Status: AC
  Administered 2017-12-16: 25 ug via INTRAVENOUS
  Filled 2017-12-16: qty 2

## 2017-12-16 MED ORDER — ONDANSETRON HCL 4 MG/2ML IJ SOLN
INTRAMUSCULAR | Status: DC | PRN
  Administered 2017-12-16: 4 mg via INTRAVENOUS

## 2017-12-16 MED ORDER — BACITRACIN ZINC 500 UNIT/GM EX OINT
TOPICAL_OINTMENT | CUTANEOUS | Status: AC
  Filled 2017-12-16: qty 28.35

## 2017-12-16 MED ORDER — METOPROLOL SUCCINATE ER 50 MG PO TB24
50.0000 mg | ORAL_TABLET | Freq: Every day | ORAL | Status: DC
  Administered 2017-12-16 – 2017-12-17 (×2): 50 mg via ORAL
  Filled 2017-12-16 (×2): qty 1

## 2017-12-16 SURGICAL SUPPLY — 38 items
BLADE SURG 15 STRL LF DISP TIS (BLADE) ×2 IMPLANT
BLADE SURG 15 STRL SS (BLADE) ×2
CANISTER SUCT 1200ML W/VALVE (MISCELLANEOUS) ×2 IMPLANT
CORD BIP STRL DISP 12FT (MISCELLANEOUS) ×2 IMPLANT
COVER WAND RF STERILE (DRAPES) IMPLANT
DRAIN TLS ROUND 10FR (DRAIN) IMPLANT
DRAPE MAG INST 16X20 L/F (DRAPES) ×2 IMPLANT
DRSG TEGADERM 2-3/8X2-3/4 SM (GAUZE/BANDAGES/DRESSINGS) ×2 IMPLANT
DRSG TEGADERM 4X4.75 (GAUZE/BANDAGES/DRESSINGS) ×2 IMPLANT
DRSG TELFA 3X8 NADH (GAUZE/BANDAGES/DRESSINGS) ×2 IMPLANT
ELECT LARYNGEAL 6/7 (MISCELLANEOUS) ×2
ELECT LARYNGEAL 8/9 (MISCELLANEOUS)
ELECT REM PT RETURN 9FT ADLT (ELECTROSURGICAL) ×2
ELECTRODE LARYNGEAL 6/7 (MISCELLANEOUS) ×1 IMPLANT
ELECTRODE LARYNGEAL 8/9 (MISCELLANEOUS) IMPLANT
ELECTRODE REM PT RTRN 9FT ADLT (ELECTROSURGICAL) ×1 IMPLANT
FORCEPS JEWEL BIP 4-3/4 STR (INSTRUMENTS) ×2 IMPLANT
GAUZE 4X4 16PLY RFD (DISPOSABLE) ×2 IMPLANT
GLOVE BIO SURGEON STRL SZ7.5 (GLOVE) ×2 IMPLANT
GLOVE PROTEXIS LATEX SZ 7.5 (GLOVE) ×2 IMPLANT
GOWN STRL REUS W/ TWL LRG LVL3 (GOWN DISPOSABLE) ×3 IMPLANT
GOWN STRL REUS W/TWL LRG LVL3 (GOWN DISPOSABLE) ×3
HEMOSTAT SURGICEL 2X3 (HEMOSTASIS) ×2 IMPLANT
HOOK STAY BLUNT/RETRACTOR 5M (MISCELLANEOUS) IMPLANT
LABEL OR SOLS (LABEL) ×2 IMPLANT
NS IRRIG 500ML POUR BTL (IV SOLUTION) ×2 IMPLANT
PACK HEAD/NECK (MISCELLANEOUS) ×2 IMPLANT
PROBE NEUROSIGN BIPOL (MISCELLANEOUS) ×1 IMPLANT
PROBE NEUROSIGN BIPOLAR (MISCELLANEOUS) ×1
SHEARS HARMONIC 9CM CVD (BLADE) ×2 IMPLANT
SPONGE KITTNER 5P (MISCELLANEOUS) ×4 IMPLANT
STRAP SAFETY 5IN WIDE (MISCELLANEOUS) ×2 IMPLANT
SUT ETHILON 6 0 9-3 1X18 BLK (SUTURE) IMPLANT
SUT PROLENE 6 0 P 1 18 (SUTURE) ×2 IMPLANT
SUT SILK 2 0 (SUTURE) ×1
SUT SILK 2-0 18XBRD TIE 12 (SUTURE) ×1 IMPLANT
SUT VIC AB 4-0 RB1 18 (SUTURE) ×2 IMPLANT
SYSTEM CHEST DRAIN TLS 7FR (DRAIN) IMPLANT

## 2017-12-16 NOTE — Anesthesia Postprocedure Evaluation (Signed)
Anesthesia Post Note  Patient: Gabrielle Tucker [birth year]  Procedure(s) Performed: THYROIDECTOMY (Bilateral )  Patient location during evaluation: PACU Anesthesia Type: General Level of consciousness: awake and alert Pain management: pain level controlled Vital Signs Assessment: post-procedure vital signs reviewed and stable Respiratory status: spontaneous breathing, nonlabored ventilation, respiratory function stable and patient connected to nasal cannula oxygen Cardiovascular status: blood pressure returned to baseline and stable Postop Assessment: no apparent nausea or vomiting Anesthetic complications: no     Last Vitals:  Vitals:   12/16/17 1118 12/16/17 1205  BP: (!) 161/81 (!) 170/89  Pulse: (!) 105 97  Resp: 16 16  Temp: 36.8 C 36.7 C  SpO2: 92% 94%    Last Pain:  Vitals:   12/16/17 1218  TempSrc:   PainSc: [place]

## 2017-12-16 NOTE — H&P (Signed)
H&P has been reviewed and patient reevaluated, no changes necessary. To be downloaded later.  

## 2017-12-16 NOTE — Transfer of Care (Signed)
Immediate Anesthesia Transfer of Care Note  Patient: Gabrielle Tucker  Procedure(s) Performed: THYROIDECTOMY (Bilateral )  Patient Location: PACU  Anesthesia Type:General  Level of Consciousness: sedated  Airway & Oxygen Therapy: Patient Spontanous Breathing and Patient connected to face mask oxygen  Post-op Assessment: Report given to RN and Post -op Vital signs reviewed and stable  Post vital signs: Reviewed and stable  Last Vitals:  Vitals Value Taken Time  BP 157/87 12/16/2017 10:01 AM  Temp 37.2 C 12/16/2017 10:01 AM  Pulse 122 12/16/2017 10:02 AM  Resp 23 12/16/2017 10:01 AM  SpO2 97 % 12/16/2017 10:02 AM  Vitals shown include unvalidated device data.  Last Pain:  Vitals:   12/16/17 0607  PainSc: 5          Complications: No apparent anesthesia complications

## 2017-12-16 NOTE — Anesthesia Post-op Follow-up Note (Signed)
Anesthesia QCDR form completed.        

## 2017-12-16 NOTE — Progress Notes (Signed)
12/16/2017 6:22 PM  Gabrielle Tucker [id number]  Post-Op check   Temp:  [97 F (36.1 C)-99 F (37.2 C)] 98.2 F (36.8 C) (11/25 1309) Pulse Rate:  [70-124] 105 (11/25 1309) Resp:  [14-23] 16 (11/25 1309) BP: (145-170)/(76-91) 163/88 (11/25 1309) SpO2:  [90 %-98 %] 95 % (11/25 1309) Weight:  [106.3 kg] 106.3 kg (11/25 0607),     Intake/Output Summary (Last 24 hours) at 12/16/2017 1822 Last data filed at 12/16/2017 1619 Gross per 24 hour  Intake 600 ml  Output 1015 ml  Net -415 ml    No results found for this or any previous visit (from the past 24 hour(s)).  SUBJECTIVE: Patient is having some discomfort in her neck that tramadol is not been taking care of.  She denied any nausea and is been able to take liquids fine.  OBJECTIVE: Vital signs noted above.  Her wound is flat and her dressings are dry.  Her drains have had some output and required change in the Vacutainer's one time so far.  The drains are about half full and will need to be changed again the right now.  Her voice is clear she does not have any problems with breathing.  IMPRESSION: She had surgery this morning and this so far is doing very well.  No evidence of recurrent nerve injury.  No evidence for hematoma  PLAN: We will see her in the morning and plan to remove drains if the drainage to settle down.  We will plan to discharge home tomorrow to rest at home with her head elevated.  Await pathology report.  Will switch to Norco for pain since tramadol did not work.  Sometimes gets a little itching and can tolerate using Phenergan to help control that while on pain medication.  Gabrielle Tucker 12/16/2017, 6:22 PM

## 2017-12-16 NOTE — Op Note (Signed)
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  2 and half centimeter inferior left thyroid mass that is very firm and densely encapsulated.  Mid left thyroid gland has a large nodule in the mid right thyroid gland had a large nodule.  Recurrent laryngeal nerve was found on both sides and stimulated post removal of the gland with good mobility of the laryngeal muscles.  Both parathyroid glands were found in the right side and one on the left.  Unsure if I found the second one on the left side.  Procedure: The patient was brought to the operating room and placed in the supine position.  She was given general anesthesia by oral endotracheal intubation and oral endotracheal tube had previously been wrapped with electrodes for continuous monitoring of recurrent laryngeal nerve during the procedure.  This was placed under direct vision to make sure it was monitoring the cords appropriately.  The neck was extended slightly but she had a relatively short neck enlarged torso.  Her neck showed a previous left scar from anterior spinal surgery that was done several years ago.  It was decided to include this scar in the incisions were unable to remove that scar and hopefully have a better incision line postoperatively.  The anterior skin was marked and then 9 mL of Xylocaine with epi 1: 100,000 was used for infiltration into the subcu tissues.  She was prepped and draped in a sterile fashion.  Incision was made to include the scar and the scar was removed.  There is some fatty scar tissue underneath it that was removed as well.   The superior and inferior base flaps were elevated and then the strap muscles were divided midline.  There is a lot of scar tissue underneath the strap muscle on the left side where previous anterior spinal disc dissection was done.  The right side was addressed first with a small mid nodule.  The strap muscles were elevated over the gland gland was rotated medially.  Superior vessels were cut across and coagulated with the harmonic scalpel.  Superior pole was freed up the gland was rotated further in the recurrent laryngeal nerve was found.  The superior parathyroid gland was freed from the superior pole and the inferior parathyroid gland was freed from the inferior pole.  With the nerve under direct vision the gland was freed up from varies ligament and the entire gland was freed down from the trachea.  This was left attached to the isthmus.  The strap muscles over the right side were then elevated.  These were densely adherent to the capsule around the gland.  There is a lot of scar tissue here.  Is able to free up the superior pole and pull this down somewhat.  Inferiorly there was a very hard mass overlying the left side of the trachea.  This was freed from the trachea and from its deep tissues.  There is a large nodule in the midportion of the thyroid gland and this was freed and pulled medially as well.  The recurrent laryngeal nerve was found underneath  this.  There appeared to be a superior parathyroid that was left in the bed but I did not see an inferior parathyroid.  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  The recurrent laryngeal nerve was traced up to where it entered into the thyroid gland and then the varies ligament and attachments to the trachea were freed up.  This freed up the left gland with a hurtful cell tumors and nodules from the trachea.  There was a pyramidal lobe that appeared to be coming off of the left side and this was tracked  superiorly.  This was then cut off at the superior thyroid notch.  The nerves were visualized on both sides and they were stimulated to show that there is good mobility on both sides.  Both lateral gutters were copiously irrigated and bleeding was controlled with electrocautery.  There is minimal oozing.  Surgicel was placed into the wounds and then 10 TLS drains were placed on both sides of wound crisscrossing in the middle.  These were placed to low continuous Vacutainer suction.  The wound was then closed using a couple stitches to pull together the strap muscles in the midline.  The platysma was then closed with 4-0 interrupted Vicryls.  The dermis was then closed with 4-0 interrupted Vicryl.  The skin edges were held in apposition with a running locking 6-0 nylon suture.  Wound was covered with some bacitracin followed by Telfa and a Tegaderm dressing.  Dispo:   To PACU to be sent to the floor for observation overnight.  Plan: We will make sure that she is doing well this evening and tomorrow morning and will plan to discharge home in the morning.  We will get a calcium level for morning time.  We will pull the drains tomorrow morning  Huey Romans  12/16/2017 10:03 AM

## 2017-12-16 NOTE — Anesthesia Preprocedure Evaluation (Signed)
Anesthesia Evaluation  Patient identified by MRN, date of birth, ID band Patient awake    Reviewed: Allergy & Precautions, NPO status , Patient's Chart, lab work & pertinent test results, reviewed documented beta blocker date and time   Airway Mallampati: III  TM Distance: >3 FB     Dental  (+) Chipped   Pulmonary           Cardiovascular + dysrhythmias Supra Ventricular Tachycardia      Neuro/Psych  Headaches, PSYCHIATRIC DISORDERS Depression    GI/Hepatic PUD,   Endo/Other    Renal/GU      Musculoskeletal  (+) Arthritis ,   Abdominal   Peds  Hematology   Anesthesia Other Findings Obese. Thyroid ca. Neck fusion.  Reproductive/Obstetrics                             Anesthesia Physical Anesthesia Plan  ASA: III  Anesthesia Plan: General   Post-op Pain Management:    Induction: Intravenous  PONV Risk Score and Plan:   Airway Management Planned: Oral ETT  Additional Equipment:   Intra-op Plan:   Post-operative Plan:   Informed Consent: I have reviewed the patients History and Physical, chart, labs and discussed the procedure including the risks, benefits and alternatives for the proposed anesthesia with the patient or authorized representative who has indicated his/her understanding and acceptance.     Plan Discussed with: CRNA  Anesthesia Plan Comments:         Anesthesia Quick Evaluation

## 2017-12-16 NOTE — Anesthesia Procedure Notes (Signed)
Procedure Name: Intubation Date/Time: 12/16/2017 7:29 AM Performed by: Hedda Slade, CRNA Pre-anesthesia Checklist: Patient identified, Patient being monitored, Timeout performed, Emergency Drugs available and Suction available Patient Re-evaluated:Patient Re-evaluated prior to induction Oxygen Delivery Method: Circle system utilized Preoxygenation: Pre-oxygenation with 100% oxygen Induction Type: IV induction Ventilation: Mask ventilation without difficulty Laryngoscope Size: Mac and 3 Grade View: Grade I Tube type: Oral Tube size: 7.0 mm Number of attempts: 2 Airway Equipment and Method: Stylet Placement Confirmation: ETT inserted through vocal cords under direct vision,  positive ETCO2 and breath sounds checked- equal and bilateral Secured at: 21 cm Tube secured with: Tape Dental Injury: Teeth and Oropharynx as per pre-operative assessment

## 2017-12-17 DIAGNOSIS — E042 Nontoxic multinodular goiter: Secondary | ICD-10-CM | POA: Diagnosis not present

## 2017-12-17 DIAGNOSIS — E063 Autoimmune thyroiditis: Secondary | ICD-10-CM | POA: Diagnosis not present

## 2017-12-17 DIAGNOSIS — F329 Major depressive disorder, single episode, unspecified: Secondary | ICD-10-CM | POA: Diagnosis not present

## 2017-12-17 DIAGNOSIS — Z79899 Other long term (current) drug therapy: Secondary | ICD-10-CM | POA: Diagnosis not present

## 2017-12-17 DIAGNOSIS — C73 Malignant neoplasm of thyroid gland: Secondary | ICD-10-CM | POA: Diagnosis not present

## 2017-12-17 DIAGNOSIS — I471 Supraventricular tachycardia: Secondary | ICD-10-CM | POA: Diagnosis not present

## 2017-12-17 LAB — T3: T3 TOTAL: 160 ng/dL (ref 71–180)

## 2017-12-17 LAB — CALCIUM: CALCIUM: 8.9 mg/dL (ref 8.9–10.3)

## 2017-12-17 LAB — T4: T4, Total: 9.3 ug/dL (ref 4.5–12.0)

## 2017-12-17 NOTE — Progress Notes (Signed)
Patient discharge teaching given, including activity, diet, follow-up appoints, and medications. Patient verbalized understanding of all discharge instructions. Wound dressing given to pt.  IV access was d/c'd. Vitals are stable. Skin is intact except as charted in most recent assessments. Pt to be escorted out by NT, to be driven home by family.  Gabrielle Tucker [employer]

## 2017-12-17 NOTE — Progress Notes (Signed)
Verbal order to discontinue all medications that were incomplete on pt.'s medication rec.  Jedi Catalfamo [employer]

## 2017-12-17 NOTE — Discharge Summary (Signed)
Physician Discharge Summary  Patient ID: Gabrielle Tucker MRN: [id number] DOB/AGE: 04-11-1960 57 y.o.  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  Recurrent laryngeal nerves are intact and her voice is clear.  3 out of 4 parathyroid glands were found and her calcium level this morning is 8.9.  Her wound is flat and without bruising or drainage.  The drains were removed this morning.  She is eating well and pain is controlled with Norco and Phenergan as the tramadol was not working for her pain.  She is on calcium supplements with vitamin D.  She is to follow-up in the office on Monday of next week for suture removal.  She is given instructions for wound care.  Consults: None  Significant Diagnostic Studies: labs: Calcium 8.9 this morning  Treatments: surgery: Total thyroidectomy  Discharge Exam: Blood pressure (!) 145/77, pulse 79, temperature 98.2 F (36.8 C), temperature source Oral, resp. rate 20, height 5\' 6"  (1.676 m), weight 106.3 kg, SpO2 97 %. Incision/Wound: Wound is flat and the drains settle down and are now removed.  The dressing is reapplied.  Disposition: Discharge disposition: 01-Home or Self Care       Discharge Instructions    Call MD for:  hives   Complete by:  As directed    Call MD for:  persistant nausea and vomiting   Complete by:  As directed    Call MD for:  redness, tenderness, or signs of infection (pain, swelling, redness, odor or green/yellow discharge around incision site)   Complete by:  As directed    Call MD for:  temperature >100.4   Complete by:  As directed    Diet general   Complete by:  As directed     Increase activity slowly   Complete by:  As directed      Patient will follow-up in the office in 1 week for suture removal.  We should have path report by that time to discuss this.   SignedHuey Romans 12/17/2017, 7:50 AM

## 2017-12-23 ENCOUNTER — Other Ambulatory Visit: Payer: Self-pay | Admitting: Pathology

## 2017-12-23 LAB — SURGICAL PATHOLOGY

## 2017-12-26 ENCOUNTER — Other Ambulatory Visit: Payer: BLUE CROSS/BLUE SHIELD

## 2017-12-26 NOTE — Progress Notes (Signed)
Tumor Board Documentation  Gabrielle Tucker was presented by Dr Gerald Dexter our Tumor Board on 12/26/2017, which included representatives from medical oncology, radiation oncology, surgical, radiology, pathology, navigation, internal medicine, research, pulmonology.  Gabrielle Tucker currently presents for [facility], for new positive pathology with history of the following treatments: surgical intervention(s).  Additionally, we reviewed previous medical and familial history, history of present illness, and recent lab results along with all available histopathologic and imaging studies. The tumor board considered available treatment options and made the following recommendations:   RAI Ablation, Referral to Medical Oncology  The following procedures/referrals were also placed: No orders of the defined types were placed in this encounter.   Clinical Trial Status: not discussed   Staging used: AJCC Stage Group  mpT2 N0  National site-specific guidelines NCCN were discussed with respect to the case.  Tumor board is a meeting of clinicians from various specialty areas who evaluate and discuss patients for whom a multidisciplinary approach is being considered. Final determinations in the plan of care are those of the provider(s). The responsibility for follow up of recommendations given during tumor board is that of the provider.   Today's extended care, comprehensive team conference, Gabrielle Tucker was not present for the discussion and was not examined.   Multidisciplinary Tumor Board is a multidisciplinary case peer review process.  Decisions discussed in the Multidisciplinary Tumor Board reflect the opinions of the specialists present at the conference without having examined the patient.  Ultimately, treatment and diagnostic decisions rest with the primary provider(s) and the patient.

## 2018-01-03 ENCOUNTER — Other Ambulatory Visit: Payer: Self-pay | Admitting: Internal Medicine

## 2018-01-03 DIAGNOSIS — C73 Malignant neoplasm of thyroid gland: Secondary | ICD-10-CM | POA: Diagnosis not present

## 2018-01-03 DIAGNOSIS — Z8711 Personal history of peptic ulcer disease: Secondary | ICD-10-CM

## 2018-01-03 DIAGNOSIS — E890 Postprocedural hypothyroidism: Secondary | ICD-10-CM | POA: Diagnosis not present

## 2018-01-06 ENCOUNTER — Other Ambulatory Visit: Payer: Self-pay | Admitting: Internal Medicine

## 2018-01-06 DIAGNOSIS — C73 Malignant neoplasm of thyroid gland: Secondary | ICD-10-CM

## 2018-01-09 ENCOUNTER — Other Ambulatory Visit: Payer: Self-pay | Admitting: Internal Medicine

## 2018-01-09 NOTE — Telephone Encounter (Signed)
Request received via interface. Please Advise.

## 2018-01-10 ENCOUNTER — Encounter
Admission: RE | Admit: 2018-01-10 | Discharge: 2018-01-10 | Disposition: A | Discharge status: Home / Self Care | Payer: BLUE CROSS/BLUE SHIELD | Source: Ambulatory Visit | Attending: Internal Medicine | Admitting: Internal Medicine

## 2018-01-10 DIAGNOSIS — C73 Malignant neoplasm of thyroid gland: Secondary | ICD-10-CM | POA: Diagnosis not present

## 2018-01-10 MED ORDER — SODIUM IODIDE I 131 CAPSULE
81.7000 | Freq: Once | INTRAVENOUS | Status: AC | PRN
  Administered 2018-01-10: 81.7 via ORAL

## 2018-01-13 ENCOUNTER — Encounter: Payer: Self-pay | Admitting: Internal Medicine

## 2018-01-16 ENCOUNTER — Other Ambulatory Visit: Payer: BLUE CROSS/BLUE SHIELD

## 2018-01-17 ENCOUNTER — Other Ambulatory Visit: Payer: BLUE CROSS/BLUE SHIELD

## 2018-01-20 ENCOUNTER — Encounter
Admission: RE | Admit: 2018-01-20 | Discharge: 2018-01-20 | Disposition: A | Discharge status: Home / Self Care | Payer: BLUE CROSS/BLUE SHIELD | Source: Ambulatory Visit | Attending: Internal Medicine | Admitting: Internal Medicine

## 2018-01-20 DIAGNOSIS — C73 Malignant neoplasm of thyroid gland: Secondary | ICD-10-CM | POA: Insufficient documentation

## 2018-01-20 DIAGNOSIS — Z85850 Personal history of malignant neoplasm of thyroid: Secondary | ICD-10-CM | POA: Diagnosis not present

## 2018-02-04 ENCOUNTER — Other Ambulatory Visit: Payer: Self-pay | Admitting: Internal Medicine

## 2018-02-04 DIAGNOSIS — M542 Cervicalgia: Secondary | ICD-10-CM

## 2018-02-28 DIAGNOSIS — C73 Malignant neoplasm of thyroid gland: Secondary | ICD-10-CM | POA: Diagnosis not present

## 2018-03-07 DIAGNOSIS — C73 Malignant neoplasm of thyroid gland: Secondary | ICD-10-CM | POA: Diagnosis not present

## 2018-03-07 DIAGNOSIS — E890 Postprocedural hypothyroidism: Secondary | ICD-10-CM | POA: Diagnosis not present

## 2018-03-15 ENCOUNTER — Other Ambulatory Visit: Payer: Self-pay

## 2018-03-15 ENCOUNTER — Encounter: Payer: Self-pay | Admitting: Gynecology

## 2018-03-15 ENCOUNTER — Ambulatory Visit
Admission: EM | Admit: 2018-03-15 | Discharge: 2018-03-15 | Disposition: A | Discharge status: Home / Self Care | Payer: BLUE CROSS/BLUE SHIELD | Attending: Emergency Medicine | Admitting: Emergency Medicine

## 2018-03-15 ENCOUNTER — Ambulatory Visit (INDEPENDENT_AMBULATORY_CARE_PROVIDER_SITE_OTHER): Payer: BLUE CROSS/BLUE SHIELD

## 2018-03-15 ENCOUNTER — Ambulatory Visit: Payer: BLUE CROSS/BLUE SHIELD

## 2018-03-15 DIAGNOSIS — M7150 Other bursitis, not elsewhere classified, unspecified site: Secondary | ICD-10-CM

## 2018-03-15 DIAGNOSIS — S5002XA Contusion of left elbow, initial encounter: Secondary | ICD-10-CM | POA: Diagnosis not present

## 2018-03-15 DIAGNOSIS — W009XXA Unspecified fall due to ice and snow, initial encounter: Secondary | ICD-10-CM | POA: Diagnosis not present

## 2018-03-15 DIAGNOSIS — M25522 Pain in left elbow: Secondary | ICD-10-CM | POA: Diagnosis not present

## 2018-03-15 DIAGNOSIS — M7989 Other specified soft tissue disorders: Secondary | ICD-10-CM | POA: Diagnosis not present

## 2018-03-15 DIAGNOSIS — S59902A Unspecified injury of left elbow, initial encounter: Secondary | ICD-10-CM | POA: Diagnosis not present

## 2018-03-15 HISTORY — DX: Essential (primary) hypertension: I10

## 2018-03-15 MED ORDER — IBUPROFEN 600 MG PO TABS: 600.0000 mg | ORAL_TABLET | Freq: Four times a day (QID) | ORAL | 0 refills | Status: DC | PRN

## 2018-03-15 MED ORDER — HYDROCODONE-ACETAMINOPHEN 5-325 MG PO TABS: 1.0000 | ORAL_TABLET | Freq: Four times a day (QID) | ORAL | 0 refills | Status: DC | PRN

## 2018-03-15 NOTE — ED Triage Notes (Signed)
Per patient fell on ice coming down steps and hit her buttock and left elbow x today.

## 2018-03-15 NOTE — ED Provider Notes (Signed)
HPI  SUBJECTIVE:  Gabrielle Tucker is a right-handed 58 y.o. female who presents with left elbow pain after having a slip and fall on some ice.  States that she fell onto her buttocks and hit her left elbow on the steps.  She reports elbow bruising, swelling, erythema.  She states that her hand is swollen.  She denies numbness or tingling or fingers.  She reports limitation of elbow motion secondary to pain.  States that her forearm, wrist, shoulder are not injured.  She denies hitting her head.  She denies chest pain, palpitations, shortness of breath, syncope causing the fall.  This occurred immediately prior to arrival.  She tried Advil 800 mg without improvement in her symptoms.  Symptoms are worse when she rests her elbow on a hard surface.  She has a past medical history of thyroid cancer.  No history of osteoporosis, left elbow injury, diabetes, kidney disease, antiplatelet or anticoagulant use.  PMD: Glean Hess, MD   Past Medical History:  Diagnosis Date  . Depression   . Dysrhythmia    tachycardia  . Gastric ulcer   . 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 Hypertension     Past Surgical History:  Procedure Laterality Date  . ABDOMINAL HYSTERECTOMY    . ANTERIOR FUSION CERVICAL SPINE  2006  . BREAST REDUCTION SURGERY    . ESOPHAGOGASTRODUODENOSCOPY  2011   gastric ulcer  . LITHOTRIPSY  2012   with setnet  . LUMBAR DISC SURGERY    . NASAL SINUS SURGERY  2015  . partail knee replacement Bilateral   . REDUCTION MAMMAPLASTY Bilateral 2011  . THYROIDECTOMY Bilateral 12/16/2017   Procedure: THYROIDECTOMY;  Surgeon: Margaretha Sheffield, MD;  Location: [facility] ORS;  Service: ENT;  Laterality: Bilateral;    Family History  Problem Relation Age of Onset  . Stomach cancer Father   . Diabetes Brother   . Breast cancer Neg Hx     Social History   Tobacco Use  . Smoking status: Never Smoker  . Smokeless tobacco: Never Used  Substance Use Topics  . Alcohol use:  Yes    Alcohol/week: 2.0 standard drinks    Types: 2 Standard drinks or equivalent per week    Comment: on occassion  . Drug use: Yes    Types: Marijuana    No current facility-administered medications for this encounter.   Current Outpatient Medications:  .  calcium-vitamin D (OSCAL WITH D) 500-200 MG-UNIT tablet, Take 2 tablets by mouth 2 (two) times daily., Disp: , Rfl:  .  escitalopram (LEXAPRO) 10 MG tablet, Take 1 tablet (10 mg total) by mouth daily., Disp: 30 tablet, Rfl: 5 .  fluticasone (FLONASE) 50 MCG/ACT nasal spray, Place 2 sprays into both nostrils daily., Disp: 16 g, Rfl: 5 .  metoprolol succinate (TOPROL-XL) 50 MG 24 hr tablet, TAKE 1 TABLET BY MOUTH EVERY DAY, Disp: 90 tablet, Rfl: 1 .  pantoprazole (PROTONIX) 40 MG tablet, TAKE 1 TABLET(40 MG) BY MOUTH TWICE DAILY, Disp: 180 tablet, Rfl: 1 .  tiZANidine (ZANAFLEX) 4 MG tablet, TAKE 1 TABLET(4 MG) BY MOUTH DAILY AS NEEDED FOR MUSCLE SPASMS, Disp: 90 tablet, Rfl: 0 .  HYDROcodone-acetaminophen (NORCO/VICODIN) 5-325 MG tablet, Take 1-2 tablets by mouth every 6 (six) hours as needed for moderate pain or severe pain., Disp: 12 tablet, Rfl: 0 .  hydrOXYzine (ATARAX/VISTARIL) 25 MG tablet, TK 1 TO 2 TS PO QHS, Disp: , Rfl: 1 .  ibuprofen (ADVIL,MOTRIN) 600 MG tablet, Take  1 tablet (600 mg total) by mouth every 6 (six) hours as needed., Disp: 30 tablet, Rfl: 0 .  levothyroxine (SYNTHROID, LEVOTHROID) 125 MCG tablet, TK 1 T PO D, Disp: , Rfl: 2  Allergies  Allergen Reactions  . Morphine And Related Nausea And Vomiting  . Tape Hives and Rash    No [paper tape.  Tega derm OK     ROS  As noted in HPI.   Physical Exam  BP (!) 169/75 (BP Location: Right Arm)   Pulse 87   Temp 98 F (36.7 C) (Oral)   Resp 18   Ht 5\' 6"  (1.676 m)   Wt 108 kg   SpO2 98%   BMI 38.41 kg/m   Constitutional: Well developed, well nourished, no acute distress Eyes:  EOMI, conjunctiva normal bilaterally HENT: Normocephalic, atraumatic,mucus  membranes moist Respiratory: Normal inspiratory effort Cardiovascular: Normal rate GI: nondistended skin: No rash, skin intact Musculoskeletal: L Elbow ROM Decreased but able to fully extend and to flex to 90 degrees, bruising, swelling at the olecranon bursa.  Supracondylar region NT, Radial head NT, Olecrenon process tender, Medial epicondyle NT, Lateral epicondyle tender,  Shoulder NT, Wrist NT, Hand NT with distal NVI CR<2secs, radial pulse intact, Sensation LT and Motor intact distally in distribution of radial, median, and ulnar nerve function. Neurologic: Alert & oriented x 3, no focal neuro deficits Psychiatric: Speech and behavior appropriate   ED Course   Medications - No data to display  Orders Placed This Encounter  Procedures  . DG Elbow Complete Left    Standing Status:   Standing    Number of Occurrences:   1    Order Specific Question:   Reason for Exam (SYMPTOM  OR DIAGNOSIS REQUIRED)    Answer:   fall r/o fx    No results found for this or any previous visit (from the past 24 hour(s)). Dg Elbow Complete Left  Result Date: 03/15/2018 CLINICAL DATA:  Fall today with left elbow pain EXAM: LEFT ELBOW - COMPLETE 3+ VIEW COMPARISON:  None. FINDINGS: Prominent posterior left elbow soft tissue swelling. No dislocation. No appreciable joint effusion. No fracture or significant arthropathy. No radiopaque foreign body. IMPRESSION: Prominent posterior left elbow soft tissue swelling, with no fracture, joint effusion or malalignment. Electronically Signed   By: Ilona Sorrel M.D.   On: 03/15/2018 17:39    ED Clinical Impression  Contusion of left elbow, initial encounter  Traumatic bursitis   ED Assessment/Plan  [place] Narcotic database reviewed for this patient, and feel that the risk/benefit ratio today is favorable for proceeding with a prescription for controlled substance.  Takes Zolepanon.  Last opiate prescription was for Norco back in November 2019.  Reviewed imaging  independently.  Prominent posterior left elbow soft tissue swelling.  No dislocation, appreciable joint effusion, fracture.  See radiology report for full details.  Pt With a traumatic bursitis/elbow contusion.  Ice, ibuprofen/Tylenol or ibuprofen/Norco, follow-up with orthopedics in 10 days if not better.  Discussed imaging, MDM, treatment plan, and plan for follow-up with patient. Discussed sn/sx that should prompt return to the ED. patient agrees with plan.   Meds ordered this encounter  Medications  . HYDROcodone-acetaminophen (NORCO/VICODIN) 5-325 MG tablet    Sig: Take 1-2 tablets by mouth every 6 (six) hours as needed for moderate pain or severe pain.    Dispense:  12 tablet    Refill:  0  . ibuprofen (ADVIL,MOTRIN) 600 MG tablet    Sig: Take 1 tablet (600  mg total) by mouth every 6 (six) hours as needed.    Dispense:  30 tablet    Refill:  0    *This clinic note was created using [employer]. Therefore, there may be occasional mistakes despite careful proofreading.   ?   Melynda Ripple, MD 03/15/18 1753

## 2018-03-15 NOTE — Discharge Instructions (Addendum)
Your x-ray was negative for a fracture.  I suspect that you have a traumatic bursitis.  Do not take the zolepanon if you are taking the [place] the area for 20 minutes at a time.  Take ibuprofen and a Tylenol containing product 3 or 4 times a day as needed for pain.  Take 600 mg of ibuprofen combined with 1 g of Tylenol for mild to moderate pain, or the 600 mg ibuprofen combined with 1-2 Norco for severe pain.  Do not take the Tylenol and Norco as they both have Tylenol in them and too much Tylenol can hurt your liver.  Do not exceed 4 g of Tylenol from all sources in 1 day.

## 2018-03-20 ENCOUNTER — Encounter: Payer: Self-pay | Admitting: Internal Medicine

## 2018-03-20 ENCOUNTER — Ambulatory Visit
Admission: RE | Admit: 2018-03-20 | Discharge: 2018-03-20 | Disposition: A | Discharge status: Home / Self Care | Payer: BLUE CROSS/BLUE SHIELD | Attending: Internal Medicine | Admitting: Internal Medicine

## 2018-03-20 ENCOUNTER — Ambulatory Visit
Admission: RE | Admit: 2018-03-20 | Discharge: 2018-03-20 | Disposition: A | Discharge status: Home / Self Care | Payer: BLUE CROSS/BLUE SHIELD | Source: Ambulatory Visit | Attending: Internal Medicine | Admitting: Internal Medicine

## 2018-03-20 ENCOUNTER — Ambulatory Visit: Payer: BLUE CROSS/BLUE SHIELD | Admitting: Internal Medicine

## 2018-03-20 ENCOUNTER — Other Ambulatory Visit: Payer: Self-pay

## 2018-03-20 VITALS — BP 128/80 | HR 73 | Ht 66.0 in | Wt 242.0 lb

## 2018-03-20 DIAGNOSIS — M533 Sacrococcygeal disorders, not elsewhere classified: Secondary | ICD-10-CM | POA: Diagnosis not present

## 2018-03-20 DIAGNOSIS — S5002XA Contusion of left elbow, initial encounter: Secondary | ICD-10-CM | POA: Diagnosis not present

## 2018-03-20 DIAGNOSIS — S3992XS Unspecified injury of lower back, sequela: Secondary | ICD-10-CM | POA: Diagnosis not present

## 2018-03-20 MED ORDER — HYDROCODONE-ACETAMINOPHEN 5-325 MG PO TABS: 1.0000 | ORAL_TABLET | Freq: Four times a day (QID) | ORAL | 0 refills | Status: DC | PRN

## 2018-03-20 NOTE — Progress Notes (Signed)
Date:  03/20/2018   Name:  Gabrielle Tucker   DOB:  1960/02/27   MRN:  [id number]   Chief Complaint: [name] Gabrielle Tucker on buttocks and still having pain. Difficulty walking and standing. Fell Saturday outside on the steps. Hit left elbow. 3rd fall since December. Fell 2 weeks ago and landed on face and nose. Eyes were both black. Concerned she broke her nose. Nose is still painful.  )  Back Pain  This is a new problem. The current episode started in the past 7 days (fell onto her buttocks after slipping on ice). The problem occurs constantly. The pain is present in the gluteal (and coccyx). The pain is moderate. The symptoms are aggravated by sitting and twisting. Pertinent negatives include no chest pain, fever or headaches.    Review of Systems  Constitutional: Negative for chills, fatigue and fever.  Respiratory: Negative for cough, chest tightness, shortness of breath and wheezing.   Cardiovascular: Negative for chest pain, palpitations and leg swelling.  Musculoskeletal: Positive for arthralgias and back pain.  Allergic/Immunologic: Negative for environmental allergies.  Neurological: Negative for dizziness and headaches.    Patient Active Problem List   Diagnosis Date Noted  . S/P total thyroidectomy 12/16/2017  . 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 Multiple thyroid nodules 11/12/2017  . Hyperlipidemia, mild 08/03/2016  . Arthritis of knee, left 08/02/2016  . Back muscle spasm 07/03/2014  . H/O peptic ulcer 07/03/2014  . Migraine without aura and responsive to treatment 07/03/2014  . Muscle contraction headache 07/03/2014  . Idiopathic insomnia 07/03/2014  . Tachycardia 07/03/2014    Allergies  Allergen Reactions  . Morphine And Related Nausea And Vomiting  . Tape Hives and Rash    No [paper tape.  Tega derm OK    Past Surgical History:  Procedure Laterality Date  . ABDOMINAL HYSTERECTOMY    . ANTERIOR FUSION CERVICAL SPINE  2006  . BREAST  REDUCTION SURGERY    . ESOPHAGOGASTRODUODENOSCOPY  2011   gastric ulcer  . LITHOTRIPSY  2012   with setnet  . LUMBAR DISC SURGERY    . NASAL SINUS SURGERY  2015  . partail knee replacement Bilateral   . REDUCTION MAMMAPLASTY Bilateral 2011  . THYROIDECTOMY Bilateral 12/16/2017   Procedure: THYROIDECTOMY;  Surgeon: Margaretha Sheffield, MD;  Location: [facility] ORS;  Service: ENT;  Laterality: Bilateral;    Social History   Tobacco Use  . Smoking status: Never Smoker  . Smokeless tobacco: Never Used  Substance Use Topics  . Alcohol use: Yes    Alcohol/week: 2.0 standard drinks    Types: 2 Standard drinks or equivalent per week    Comment: on occassion  . Drug use: Yes    Types: Marijuana     Medication list has been reviewed and updated.  Current Meds  Medication Sig  . calcium-vitamin D (OSCAL WITH D) 500-200 MG-UNIT tablet Take 2 tablets by mouth 2 (two) times daily.  Marland Kitchen escitalopram (LEXAPRO) 10 MG tablet Take 1 tablet (10 mg total) by mouth daily.  . fluticasone (FLONASE) 50 MCG/ACT nasal spray Place 2 sprays into both nostrils daily.  Marland Kitchen HYDROcodone-acetaminophen (NORCO/VICODIN) 5-325 MG tablet Take 1-2 tablets by mouth every 6 (six) hours as needed for moderate pain or severe pain.  . hydrOXYzine (ATARAX/VISTARIL) 25 MG tablet TK 1 TO 2 TS PO QHS  . ibuprofen (ADVIL,MOTRIN) 600 MG tablet Take 1 tablet (600 mg total) by mouth every 6 (six) hours as needed.  Marland Kitchen  levothyroxine (SYNTHROID, LEVOTHROID) 125 MCG tablet 175 mcg daily before breakfast.   . metoprolol succinate (TOPROL-XL) 50 MG 24 hr tablet TAKE 1 TABLET BY MOUTH EVERY DAY  . pantoprazole (PROTONIX) 40 MG tablet TAKE 1 TABLET(40 MG) BY MOUTH TWICE DAILY  . tiZANidine (ZANAFLEX) 4 MG tablet TAKE 1 TABLET(4 MG) BY MOUTH DAILY AS NEEDED FOR MUSCLE SPASMS    PHQ 2/9 Scores 03/20/2018 06/21/2017 04/15/2017 08/02/2016  PHQ - 2 Score 0 4 5 1   PHQ- 9 Score - 22 17 -    Physical Exam Vitals signs and nursing note reviewed.    Constitutional:      General: She is not in acute distress.    Appearance: She is well-developed.  HENT:     Head: Normocephalic and atraumatic.     Nose: Nasal deformity and nasal tenderness present. No mucosal edema.     Right Nostril: No occlusion.     Left Nostril: No occlusion.  Cardiovascular:     Rate and Rhythm: Normal rate and regular rhythm.  Pulmonary:     Effort: Pulmonary effort is normal. No respiratory distress.     Breath sounds: Normal breath sounds.  Musculoskeletal: Normal range of motion.     Left elbow: She exhibits normal range of motion.     Lumbar back: She exhibits bony tenderness.       Arms:  Skin:    General: Skin is warm and dry.     Findings: No rash.  Neurological:     Mental Status: She is alert and oriented to person, place, and time.  Psychiatric:        Behavior: Behavior normal.        Thought Content: Thought content normal.     BP 128/80   Pulse 73   Ht 5\' 6"  (1.676 m)   Wt 242 lb (109.8 kg)   SpO2 95%   BMI 39.06 kg/m   Assessment and Plan: 1. Coccyx pain Rule out fracture Recommend donut cushion norco for sleep only - DG Sacrum/Coccyx; Future - HYDROcodone-acetaminophen (NORCO/VICODIN) 5-325 MG tablet; Take 1-2 tablets by mouth every 6 (six) hours as needed for moderate pain or severe pain.  Dispense: 12 tablet; Refill: 0  2. Contusion of left elbow, initial encounter No fracture by previous Xray Continue Advil and elevation  Suspect nasal fracture but not causing sx at this time  Partially dictated using Editor, commissioning. Any errors are unintentional.  Halina Maidens, MD [facility]  03/20/2018

## 2018-04-07 ENCOUNTER — Other Ambulatory Visit: Payer: Self-pay | Admitting: Internal Medicine

## 2018-04-07 DIAGNOSIS — F39 Unspecified mood [affective] disorder: Secondary | ICD-10-CM

## 2018-04-10 ENCOUNTER — Other Ambulatory Visit: Payer: Self-pay

## 2018-04-10 MED ORDER — FLUCONAZOLE 150 MG PO TABS: 150.0000 mg | ORAL_TABLET | Freq: Once | ORAL | 0 refills | Status: AC

## 2018-04-21 ENCOUNTER — Other Ambulatory Visit: Payer: Self-pay | Admitting: Internal Medicine

## 2018-04-21 DIAGNOSIS — M542 Cervicalgia: Secondary | ICD-10-CM

## 2018-04-29 DIAGNOSIS — C73 Malignant neoplasm of thyroid gland: Secondary | ICD-10-CM | POA: Diagnosis not present

## 2018-04-29 DIAGNOSIS — E890 Postprocedural hypothyroidism: Secondary | ICD-10-CM | POA: Diagnosis not present

## 2018-05-06 DIAGNOSIS — R030 Elevated blood-pressure reading, without diagnosis of hypertension: Secondary | ICD-10-CM | POA: Diagnosis not present

## 2018-05-06 DIAGNOSIS — E890 Postprocedural hypothyroidism: Secondary | ICD-10-CM | POA: Diagnosis not present

## 2018-05-06 DIAGNOSIS — C73 Malignant neoplasm of thyroid gland: Secondary | ICD-10-CM | POA: Diagnosis not present

## 2018-06-04 DIAGNOSIS — C73 Malignant neoplasm of thyroid gland: Secondary | ICD-10-CM | POA: Diagnosis not present

## 2018-06-24 ENCOUNTER — Ambulatory Visit (INDEPENDENT_AMBULATORY_CARE_PROVIDER_SITE_OTHER): Payer: BC Managed Care – PPO

## 2018-06-24 ENCOUNTER — Other Ambulatory Visit: Payer: Self-pay

## 2018-06-24 ENCOUNTER — Other Ambulatory Visit: Payer: Self-pay | Admitting: Podiatry

## 2018-06-24 ENCOUNTER — Ambulatory Visit: Payer: BLUE CROSS/BLUE SHIELD | Admitting: Podiatry

## 2018-06-24 ENCOUNTER — Encounter: Payer: Self-pay | Admitting: Podiatry

## 2018-06-24 VITALS — Temp 97.3°F

## 2018-06-24 DIAGNOSIS — M7672 Peroneal tendinitis, left leg: Secondary | ICD-10-CM

## 2018-06-24 DIAGNOSIS — M778 Other enthesopathies, not elsewhere classified: Secondary | ICD-10-CM

## 2018-06-24 MED ORDER — METHYLPREDNISOLONE 4 MG PO TBPK: ORAL_TABLET | ORAL | 0 refills | Status: DC

## 2018-06-24 MED ORDER — MELOXICAM 15 MG PO TABS: 15.0000 mg | ORAL_TABLET | Freq: Every day | ORAL | 1 refills | Status: DC

## 2018-06-26 NOTE — Progress Notes (Signed)
   HPI: 58 year old female presenting today with a chief complaint of burning pain and tenderness to the dorsolateral aspect of the left foot that began 4-5 months ago. She states she tripped and fell over some wood, breaking her nose and causing pain to her foot. She states the pain has been getting progressively worse. Applying pressure to the foot increases the pain. Patient is here for further evaluation and treatment.   Past Medical History:  Diagnosis Date  . Depression   . Dysrhythmia    tachycardia  . Gastric ulcer   . 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 Hypertension      Physical Exam: General: The patient is alert and oriented x3 in no acute distress.  Dermatology: Skin is warm, dry and supple bilateral lower extremities. Negative for open lesions or macerations.  Vascular: Palpable pedal pulses bilaterally. No edema or erythema noted. Capillary refill within normal limits.  Neurological: Epicritic and protective threshold grossly intact bilaterally.   Musculoskeletal Exam: Pain with palpation to the insertion of the peroneal tendon of the left foot. Range of motion within normal limits to all pedal and ankle joints bilateral. Muscle strength 5/5 in all groups bilateral.   Radiographic Exam:  Normal osseous mineralization. Joint spaces preserved. No fracture/dislocation/boney destruction.    Assessment: 1. Insertional peroneal tendinitis left    Plan of Care:  1. Patient evaluated. X-Rays reviewed.  2. Injection of 0.5 mLs Celestone Soluspan injected into the insertion of the peroneal tendon sheath of the left foot.  3. Prescription for Medrol Dose Pak provided to patient. 4. Prescription for Meloxicam provided to patient. 5. CAM boot dispensed. Weightbearing as tolerated.  6. Return to clinic in 4 weeks.   Glass blower/designer at [employer].       Edrick Kins, DPM [facility]  Dr. Edrick Kins, DPM    [address], [place] [address]                Office [phone]  Fax [phone]

## 2018-07-08 ENCOUNTER — Other Ambulatory Visit: Payer: Self-pay | Admitting: Internal Medicine

## 2018-07-08 DIAGNOSIS — M542 Cervicalgia: Secondary | ICD-10-CM

## 2018-07-08 DIAGNOSIS — Z8711 Personal history of peptic ulcer disease: Secondary | ICD-10-CM

## 2018-07-16 ENCOUNTER — Encounter: Payer: Self-pay | Admitting: Internal Medicine

## 2018-07-18 DIAGNOSIS — E890 Postprocedural hypothyroidism: Secondary | ICD-10-CM | POA: Diagnosis not present

## 2018-07-22 ENCOUNTER — Ambulatory Visit: Payer: BC Managed Care – PPO | Admitting: Podiatry

## 2018-07-22 ENCOUNTER — Encounter: Payer: Self-pay | Admitting: Podiatry

## 2018-07-22 ENCOUNTER — Other Ambulatory Visit: Payer: Self-pay

## 2018-07-22 DIAGNOSIS — M779 Enthesopathy, unspecified: Secondary | ICD-10-CM | POA: Diagnosis not present

## 2018-07-22 DIAGNOSIS — M7672 Peroneal tendinitis, left leg: Secondary | ICD-10-CM

## 2018-07-22 DIAGNOSIS — M778 Other enthesopathies, not elsewhere classified: Secondary | ICD-10-CM

## 2018-07-23 ENCOUNTER — Other Ambulatory Visit: Payer: Self-pay

## 2018-07-23 DIAGNOSIS — M7672 Peroneal tendinitis, left leg: Secondary | ICD-10-CM

## 2018-07-23 DIAGNOSIS — S86312A Strain of muscle(s) and tendon(s) of peroneal muscle group at lower leg level, left leg, initial encounter: Secondary | ICD-10-CM

## 2018-07-23 DIAGNOSIS — M778 Other enthesopathies, not elsewhere classified: Secondary | ICD-10-CM

## 2018-07-23 NOTE — Progress Notes (Signed)
Mri foot

## 2018-07-23 NOTE — Progress Notes (Signed)
   HPI: 58 year old female presenting today for follow up evaluation of left foot pain. She states the pain is relatively unchanged. She reports that she still cannot bend her toes without pain. She has been using the boot and taking the Meloxicam as directed with some relief. Patient is here for further evaluation and treatment.   Past Medical History:  Diagnosis Date  . Depression   . Dysrhythmia    tachycardia  . Gastric ulcer   . 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 Hypertension      Physical Exam: General: The patient is alert and oriented x3 in no acute distress.  Dermatology: Skin is warm, dry and supple bilateral lower extremities. Negative for open lesions or macerations.  Vascular: Palpable pedal pulses bilaterally. No edema or erythema noted. Capillary refill within normal limits.  Neurological: Epicritic and protective threshold grossly intact bilaterally.   Musculoskeletal Exam: Pain with palpation to the insertion of the peroneal tendon of the left foot as well as the lateral left foot. Range of motion within normal limits to all pedal and ankle joints bilateral. Muscle strength 5/5 in all groups bilateral.   Assessment: 1. Insertional peroneal tendinitis left  2. Left lateral foot capsulitis    Plan of Care:  1. Patient evaluated.  2. Continue using CAM boot.  3. Continue taking Meloxicam 15 mg.  4. MRI of the left foot placed.  5. Return to clinic in 4 weeks to review MRI results.   Glass blower/designer at [employer].       Edrick Kins, DPM [facility]  Dr. Edrick Kins, DPM    [address], [place] [address]                Office [phone]  Fax [phone]

## 2018-07-29 ENCOUNTER — Telehealth: Payer: Self-pay

## 2018-07-29 NOTE — Telephone Encounter (Signed)
-----   Message from Edrick Kins, DPM sent at 07/22/2018  5:20 PM EDT ----- Regarding: MRI left foot Please order MRI left foot  Dx: peroneal tendon tear left. Capsulitis left lateral midfoot.   Thanks Dr. Amalia Hailey

## 2018-08-04 ENCOUNTER — Other Ambulatory Visit: Payer: Self-pay

## 2018-08-04 ENCOUNTER — Ambulatory Visit
Admission: RE | Admit: 2018-08-04 | Discharge: 2018-08-04 | Disposition: A | Discharge status: Home / Self Care | Payer: BC Managed Care – PPO | Source: Ambulatory Visit | Attending: Podiatry | Admitting: Podiatry

## 2018-08-04 DIAGNOSIS — S86312A Strain of muscle(s) and tendon(s) of peroneal muscle group at lower leg level, left leg, initial encounter: Secondary | ICD-10-CM | POA: Diagnosis not present

## 2018-08-04 DIAGNOSIS — M779 Enthesopathy, unspecified: Secondary | ICD-10-CM | POA: Diagnosis not present

## 2018-08-04 DIAGNOSIS — M778 Other enthesopathies, not elsewhere classified: Secondary | ICD-10-CM

## 2018-08-04 DIAGNOSIS — M7672 Peroneal tendinitis, left leg: Secondary | ICD-10-CM | POA: Insufficient documentation

## 2018-08-04 DIAGNOSIS — M79672 Pain in left foot: Secondary | ICD-10-CM | POA: Diagnosis not present

## 2018-08-19 DIAGNOSIS — L508 Other urticaria: Secondary | ICD-10-CM | POA: Diagnosis not present

## 2018-08-26 ENCOUNTER — Other Ambulatory Visit: Payer: Self-pay

## 2018-08-26 ENCOUNTER — Encounter: Payer: Self-pay | Admitting: Podiatry

## 2018-08-26 ENCOUNTER — Ambulatory Visit: Payer: BC Managed Care – PPO | Admitting: Podiatry

## 2018-08-26 DIAGNOSIS — M7672 Peroneal tendinitis, left leg: Secondary | ICD-10-CM

## 2018-08-26 DIAGNOSIS — S86312A Strain of muscle(s) and tendon(s) of peroneal muscle group at lower leg level, left leg, initial encounter: Secondary | ICD-10-CM | POA: Diagnosis not present

## 2018-08-27 ENCOUNTER — Telehealth: Payer: Self-pay | Admitting: *Deleted

## 2018-08-27 NOTE — Telephone Encounter (Signed)
Pt states she saw Dr. Amalia Hailey yesterday in [place] yesterday and she and her husband have discuss her foot and they would like to begin process for surgery.

## 2018-08-28 NOTE — Progress Notes (Signed)
   HPI: 58 year old female presenting today for follow up evaluation of left foot pain. She states the pain is relatively unchanged. She reports using the CAM boot which helps alleviate her symptoms. She states she cannot walk without pain while note wearing the boot. She has been taking Meloxicam as directed. She had an MRI done on 08/04/2018. Patient is here for further evaluation and treatment.   Past Medical History:  Diagnosis Date  . Depression   . Dysrhythmia    tachycardia  . Gastric ulcer   . 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 Hypertension      Physical Exam: General: The patient is alert and oriented x3 in no acute distress.  Dermatology: Skin is warm, dry and supple bilateral lower extremities. Negative for open lesions or macerations.  Vascular: Palpable pedal pulses bilaterally. No edema or erythema noted. Capillary refill within normal limits.  Neurological: Epicritic and protective threshold grossly intact bilaterally.   Musculoskeletal Exam: Pain with palpation to the insertion of the peroneal tendon of the left foot as well as the lateral left foot. Range of motion within normal limits to all pedal and ankle joints bilateral. Muscle strength 5/5 in all groups bilateral.   MRI Impression:  1. Moderate tendinopathy involving the peroneal tendons, mainly in the peroneus longus tendon with interstitial tears but no full-thickness retracted tear. 2. Mild tenosynovitis involving the posterior tibialis tendon. 3. Mild distal Achilles tendinopathy. There is a Haglund's deformity and mild retrocalcaneal bursitis. 4. Intact medial and lateral ankle ligaments. 5. Pes cavus and mild/early midfoot and hindfoot degenerative changes. 6. No stress fracture or AVN.  Assessment: 1. Insertional peroneal tendinitis left  2. Left lateral foot capsulitis    Plan of Care:  1. Patient evaluated. MRI reviewed.  2. Appointment with Janett Billow, RN for EPAT.  3. Continue  using CAM boot as needed.  4. Return to clinic in 3 months. If not better, we will discuss surgical intervention.   Glass blower/designer at [employer].       Edrick Kins, DPM [facility]  Dr. Edrick Kins, DPM    [address], [place] [address]                Office [phone]  Fax [phone]

## 2018-09-01 ENCOUNTER — Other Ambulatory Visit: Payer: Self-pay | Admitting: Internal Medicine

## 2018-09-01 ENCOUNTER — Telehealth: Payer: Self-pay

## 2018-09-01 MED ORDER — FLUCONAZOLE 100 MG PO TABS: 100.0000 mg | ORAL_TABLET | Freq: Every day | ORAL | 0 refills | Status: DC

## 2018-09-01 NOTE — Telephone Encounter (Signed)
Sent Rx to [facility]

## 2018-09-01 NOTE — Telephone Encounter (Signed)
Tried OTC meds for yeast inf and states she usually gets RX called in. Advised we will call if we can not send in RX otherwise check later with [facility] in [place] to see about RX called in.

## 2018-09-02 ENCOUNTER — Other Ambulatory Visit: Payer: BC Managed Care – PPO

## 2018-09-05 ENCOUNTER — Encounter: Payer: Self-pay | Admitting: Podiatry

## 2018-09-05 ENCOUNTER — Other Ambulatory Visit: Payer: Self-pay

## 2018-09-05 ENCOUNTER — Ambulatory Visit: Payer: BC Managed Care – PPO | Admitting: Podiatry

## 2018-09-05 VITALS — Temp 97.4°F

## 2018-09-05 DIAGNOSIS — M7672 Peroneal tendinitis, left leg: Secondary | ICD-10-CM | POA: Diagnosis not present

## 2018-09-05 NOTE — Patient Instructions (Signed)
Pre-Operative Instructions  Congratulations, you have decided to take an important step towards improving your quality of life.  You can be assured that the doctors and staff at [facility] will be with you every step of the way.  Here are some important things you should know:  1. Plan to be at the surgery center/hospital at least 1 (one) hour prior to your scheduled time, unless otherwise directed by the surgical center/hospital staff.  You must have a responsible adult accompany you, remain during the surgery and drive you home.  Make sure you have directions to the surgical center/hospital to ensure you arrive on time. 2. If you are having surgery at [facility] or [facility] hospitals, you will need a copy of your medical history and physical form from your family physician within one month prior to the date of surgery. We will give you a form for your primary physician to complete.  3. We make every effort to accommodate the date you request for surgery.  However, there are times where surgery dates or times have to be moved.  We will contact you as soon as possible if a change in schedule is required.   4. No aspirin/ibuprofen for one week before surgery.  If you are on aspirin, any non-steroidal anti-inflammatory medications (Mobic, Aleve, Ibuprofen) should not be taken seven (7) days prior to your surgery.  You make take Tylenol for pain prior to surgery.  5. Medications - If you are taking daily heart and blood pressure medications, seizure, reflux, allergy, asthma, anxiety, pain or diabetes medications, make sure you notify the surgery center/hospital before the day of surgery so they can tell you which medications you should take or avoid the day of surgery. 6. No food or drink after midnight the night before surgery unless directed otherwise by surgical center/hospital staff. 7. No alcoholic beverages 24-hours prior to surgery.  No smoking 24-hours prior or 24-hours after  surgery. 8. Wear loose pants or shorts. They should be loose enough to fit over bandages, boots, and casts. 9. Don't wear slip-on shoes. Sneakers are preferred. 10. Bring your boot with you to the surgery center/hospital.  Also bring crutches or a walker if your physician has prescribed it for you.  If you do not have this equipment, it will be provided for you after surgery. 11. If you have not been contacted by the surgery center/hospital by the day before your surgery, call to confirm the date and time of your surgery. 12. Leave-time from work may vary depending on the type of surgery you have.  Appropriate arrangements should be made prior to surgery with your employer. 13. Prescriptions will be provided immediately following surgery by your doctor.  Fill these as soon as possible after surgery and take the medication as directed. Pain medications will not be refilled on weekends and must be approved by the doctor. 14. Remove nail polish on the operative foot and avoid getting pedicures prior to surgery. 15. Wash the night before surgery.  The night before surgery wash the foot and leg well with water and the antibacterial soap provided. Be sure to pay special attention to beneath the toenails and in between the toes.  Wash for at least three (3) minutes. Rinse thoroughly with water and dry well with a towel.  Perform this wash unless told not to do so by your physician.  Enclosed: 1 Ice pack (please put in freezer the night before surgery)   1 Hibiclens skin cleaner     Pre-op instructions  If you have any questions regarding the instructions, please do not hesitate to call our office.  [place]: [address], [place], [place] [address] -- [phone]  [place]: [address] [place], [place] [address] -- [phone]  [place]: [address]  [place], [place] [address] -- [phone]  [place]: [address], [place], [place] [address] -- [phone]  Website:  [email or web address] 

## 2018-09-08 ENCOUNTER — Telehealth: Payer: Self-pay | Admitting: *Deleted

## 2018-09-08 ENCOUNTER — Other Ambulatory Visit: Payer: Self-pay | Admitting: Internal Medicine

## 2018-09-08 ENCOUNTER — Telehealth: Payer: Self-pay | Admitting: Podiatry

## 2018-09-08 DIAGNOSIS — M542 Cervicalgia: Secondary | ICD-10-CM

## 2018-09-08 NOTE — Progress Notes (Signed)
   HPI: 58 year old female presenting today for follow up evaluation of left foot pain. She reports continued pain of the foot. She has been using the CAM boot as directed. Walking and standing for long periods of time increases the pain. Patient is here for further evaluation and treatment.   Past Medical History:  Diagnosis Date  . Depression   . Dysrhythmia    tachycardia  . Gastric ulcer   . 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 Hypertension      Physical Exam: General: The patient is alert and oriented x3 in no acute distress.  Dermatology: Skin is warm, dry and supple bilateral lower extremities. Negative for open lesions or macerations.  Vascular: Palpable pedal pulses bilaterally. No edema or erythema noted. Capillary refill within normal limits.  Neurological: Epicritic and protective threshold grossly intact bilaterally.   Musculoskeletal Exam: Pain with palpation to the insertion of the peroneal tendon of the left foot as well as the lateral left foot. Range of motion within normal limits to all pedal and ankle joints bilateral. Muscle strength 5/5 in all groups bilateral.   MRI Impression:  1. Moderate tendinopathy involving the peroneal tendons, mainly in the peroneus longus tendon with interstitial tears but no full-thickness retracted tear. 2. Mild tenosynovitis involving the posterior tibialis tendon. 3. Mild distal Achilles tendinopathy. There is a Haglund's deformity and mild retrocalcaneal bursitis. 4. Intact medial and lateral ankle ligaments. 5. Pes cavus and mild/early midfoot and hindfoot degenerative changes. 6. No stress fracture or AVN.  Assessment: 1. Insertional peroneal tendinitis left  2. Left lateral foot capsulitis    Plan of Care:  1. Patient evaluated. MRI reviewed.  2. Today we discussed the conservative versus surgical management of the presenting pathology. The patient opts for surgical management. All possible complications  and details of the procedure were explained. All patient questions were answered. No guarantees were expressed or implied. 3. Authorization for surgery was initiated today. Surgery will consist of tenosynovectomy left peroneal tendon; repair of peroneal tendon left.  4. Continue weightbearing in CAM boot.  5. Return to clinic one week post op.    Glass blower/designer at [employer].       Edrick Kins, DPM [facility]  Dr. Edrick Kins, DPM    [address],  [address]                Office [phone]  Fax [phone]

## 2018-09-08 NOTE — Telephone Encounter (Signed)
"  I'm calling to schedule my surgery with Dr. Amalia Hailey."  Dr. Amalia Hailey' next available date is October 30, 2018.  Would you like that date?  "He's that far out?  Well I guess I will."   Yes, he is scheduled that far out.  I'll get it scheduled.

## 2018-09-08 NOTE — Telephone Encounter (Signed)
Pt stated she was told to call today to schedule her surgery.

## 2018-09-17 ENCOUNTER — Encounter: Payer: Self-pay | Admitting: Podiatry

## 2018-10-02 ENCOUNTER — Encounter: Payer: Self-pay | Admitting: Internal Medicine

## 2018-10-07 DIAGNOSIS — C73 Malignant neoplasm of thyroid gland: Secondary | ICD-10-CM | POA: Diagnosis not present

## 2018-10-07 DIAGNOSIS — E890 Postprocedural hypothyroidism: Secondary | ICD-10-CM | POA: Diagnosis not present

## 2018-10-07 LAB — TSH: TSH: 0.95 (ref 0.41–5.90)

## 2018-10-09 ENCOUNTER — Other Ambulatory Visit: Payer: Self-pay

## 2018-10-09 MED ORDER — FLUCONAZOLE 100 MG PO TABS: 100.0000 mg | ORAL_TABLET | Freq: Every day | ORAL | 0 refills | Status: DC

## 2018-10-14 DIAGNOSIS — E890 Postprocedural hypothyroidism: Secondary | ICD-10-CM | POA: Diagnosis not present

## 2018-10-14 DIAGNOSIS — C73 Malignant neoplasm of thyroid gland: Secondary | ICD-10-CM | POA: Diagnosis not present

## 2018-10-16 ENCOUNTER — Other Ambulatory Visit: Payer: Self-pay | Admitting: *Deleted

## 2018-10-16 DIAGNOSIS — M7672 Peroneal tendinitis, left leg: Secondary | ICD-10-CM

## 2018-10-21 ENCOUNTER — Telehealth: Payer: Self-pay | Admitting: Internal Medicine

## 2018-10-21 NOTE — Telephone Encounter (Signed)
Patient called complain of high blood sugar with thirtsy and tired. Blood sugar reading between 500 and 318 she is seeing a endocrinologist for thyroid cancer she also mention if this will affect her having foot surgery this coming week

## 2018-10-21 NOTE — Telephone Encounter (Signed)
Patient is not a diabetic per her chart. She can come see [place] to discuss at [facility] or see her endocrinologist to discuss new problem. She needs to be seen as soon as possible. Thanks.

## 2018-10-22 ENCOUNTER — Other Ambulatory Visit: Payer: Self-pay | Admitting: Internal Medicine

## 2018-10-22 DIAGNOSIS — F39 Unspecified mood [affective] disorder: Secondary | ICD-10-CM

## 2018-10-23 ENCOUNTER — Telehealth: Payer: Self-pay | Admitting: *Deleted

## 2018-10-23 NOTE — Telephone Encounter (Signed)
"  I am scheduled for surgery next Thursday with Dr. Ellard Artis.  I just have several questions.  If you would, call me back."

## 2018-10-28 ENCOUNTER — Telehealth: Payer: Self-pay | Admitting: *Deleted

## 2018-10-28 NOTE — Telephone Encounter (Signed)
I attempted to return her call.  I left her a message asking her to call me back tomorrow.

## 2018-10-28 NOTE — Telephone Encounter (Signed)
DOS 10/30/2018 TENOSYNOVECTOMY LT - 27680  BCBS: Eligibility Date - 12/22/2017 - 12/22/2018   In-Network    Max Per Benefit Period Year-to-Date Remaining  CoInsurance  20%    Deductible  $1500.00 $0.00  Out-Of-Pocket 3  $4000.00 $413.75   In Network  [employer] Coinsurance Authorization Required  Not Applicable  123456 per Service Year  No

## 2018-10-29 NOTE — Telephone Encounter (Signed)
I am returning your call.  You had left a message saying you have some questions.  "Everything has been taken care of, I finally spoke to the nurse.  Thanks for calling me back."

## 2018-10-30 ENCOUNTER — Other Ambulatory Visit: Payer: Self-pay | Admitting: Podiatry

## 2018-10-30 ENCOUNTER — Encounter: Payer: Self-pay | Admitting: Podiatry

## 2018-10-30 DIAGNOSIS — M66372 Spontaneous rupture of flexor tendons, left ankle and foot: Secondary | ICD-10-CM | POA: Diagnosis not present

## 2018-10-30 DIAGNOSIS — M65872 Other synovitis and tenosynovitis, left ankle and foot: Secondary | ICD-10-CM

## 2018-10-30 DIAGNOSIS — M7672 Peroneal tendinitis, left leg: Secondary | ICD-10-CM | POA: Diagnosis not present

## 2018-10-30 MED ORDER — OXYCODONE-ACETAMINOPHEN 5-325 MG PO TABS: 1.0000 | ORAL_TABLET | Freq: Four times a day (QID) | ORAL | 0 refills | Status: DC | PRN

## 2018-10-30 NOTE — Progress Notes (Signed)
.  postop

## 2018-11-04 ENCOUNTER — Encounter: Payer: BLUE CROSS/BLUE SHIELD | Admitting: Internal Medicine

## 2018-11-07 ENCOUNTER — Encounter: Payer: Self-pay | Admitting: Podiatry

## 2018-11-07 ENCOUNTER — Ambulatory Visit (INDEPENDENT_AMBULATORY_CARE_PROVIDER_SITE_OTHER): Payer: Self-pay | Admitting: Podiatry

## 2018-11-07 ENCOUNTER — Other Ambulatory Visit: Payer: Self-pay

## 2018-11-07 DIAGNOSIS — M7672 Peroneal tendinitis, left leg: Secondary | ICD-10-CM

## 2018-11-07 DIAGNOSIS — Z9889 Other specified postprocedural states: Secondary | ICD-10-CM

## 2018-11-10 DIAGNOSIS — R739 Hyperglycemia, unspecified: Secondary | ICD-10-CM | POA: Diagnosis not present

## 2018-11-10 LAB — HEMOGLOBIN A1C: Hemoglobin A1C: 11.3

## 2018-11-10 MED ORDER — OXYCODONE-ACETAMINOPHEN 5-325 MG PO TABS: 1.0000 | ORAL_TABLET | Freq: Four times a day (QID) | ORAL | 0 refills | Status: DC | PRN

## 2018-11-11 NOTE — Progress Notes (Signed)
   Subjective:  Patient presents today status post peroneal tenosynovectomy left. DOS: 10/30/2018. She reports moderate pain. She has been taking Percocet for treatment. She has been using the CAM boot as directed. She denies modifying factors. Patient is here for further evaluation and treatment.    Past Medical History:  Diagnosis Date  . Depression   . Dysrhythmia    tachycardia  . Gastric ulcer   . 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 Hypertension       Objective/Physical Exam Neurovascular status intact.  Skin incisions appear to be well coapted with sutures and staples intact. No sign of infectious process noted. No dehiscence. No active bleeding noted. Moderate edema noted to the surgical extremity.  Assessment: 1. s/p peroneal tenosynovectomy left. DOS: 10/30/2018   Plan of Care:  1. Patient was evaluated.  2. Dressing changed.  3. Continue nonweightbearing in CAM boot.  4. Refill prescription for Percocet 5/325 mg provided to patient.  5. Return to clinic in one week.    Edrick Kins, DPM [facility]  Dr. Edrick Kins, [place]                                        [place], [place] [address]                Office [phone]  Fax [phone]

## 2018-11-17 DIAGNOSIS — E890 Postprocedural hypothyroidism: Secondary | ICD-10-CM | POA: Diagnosis not present

## 2018-11-17 DIAGNOSIS — E119 Type 2 diabetes mellitus without complications: Secondary | ICD-10-CM | POA: Diagnosis not present

## 2018-11-17 DIAGNOSIS — R030 Elevated blood-pressure reading, without diagnosis of hypertension: Secondary | ICD-10-CM | POA: Diagnosis not present

## 2018-11-17 DIAGNOSIS — C73 Malignant neoplasm of thyroid gland: Secondary | ICD-10-CM | POA: Diagnosis not present

## 2018-11-18 ENCOUNTER — Other Ambulatory Visit: Payer: Self-pay

## 2018-11-18 ENCOUNTER — Ambulatory Visit (INDEPENDENT_AMBULATORY_CARE_PROVIDER_SITE_OTHER): Payer: BC Managed Care – PPO | Admitting: Podiatry

## 2018-11-18 ENCOUNTER — Encounter: Payer: Self-pay | Admitting: Podiatry

## 2018-11-18 DIAGNOSIS — M7672 Peroneal tendinitis, left leg: Secondary | ICD-10-CM

## 2018-11-18 DIAGNOSIS — Z9889 Other specified postprocedural states: Secondary | ICD-10-CM

## 2018-11-18 MED ORDER — OXYCODONE-ACETAMINOPHEN 5-325 MG PO TABS: 1.0000 | ORAL_TABLET | Freq: Four times a day (QID) | ORAL | 0 refills | Status: DC | PRN

## 2018-11-20 NOTE — Progress Notes (Signed)
   Subjective:  Patient presents today status post peroneal tenosynovectomy left. DOS: 10/30/2018. She states she is improving. She reports some continued soreness with associated swelling. She has been using the CAM boot and taking Percocet as directed. She denies any known worsening factors. Patient is here for further evaluation and treatment.    Past Medical History:  Diagnosis Date  . Depression   . Dysrhythmia    tachycardia  . Gastric ulcer   . 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 Hypertension       Objective/Physical Exam Neurovascular status intact.  Skin incisions appear to be well coapted with sutures and staples intact. No sign of infectious process noted. No dehiscence. No active bleeding noted. Moderate edema noted to the surgical extremity.  Assessment: 1. s/p peroneal tenosynovectomy left. DOS: 10/30/2018   Plan of Care:  1. Patient was evaluated.  2. Staples removed.  3. Unna boot applied.  4. Continue nonweightbearing in CAM boot.  5. Refill prescription for Percocet 5/325 mg provided to patient.  6. Return to clinic in one week to initiate physical therapy.    Edrick Kins, DPM [facility]  Dr. Edrick Kins, [place]                                        [place], [place] [address]                Office [phone]  Fax [phone]

## 2018-11-25 ENCOUNTER — Ambulatory Visit (INDEPENDENT_AMBULATORY_CARE_PROVIDER_SITE_OTHER): Payer: Self-pay | Admitting: Podiatry

## 2018-11-25 ENCOUNTER — Other Ambulatory Visit: Payer: Self-pay

## 2018-11-25 DIAGNOSIS — Z9889 Other specified postprocedural states: Secondary | ICD-10-CM

## 2018-11-25 DIAGNOSIS — M7672 Peroneal tendinitis, left leg: Secondary | ICD-10-CM

## 2018-11-26 DIAGNOSIS — R262 Difficulty in walking, not elsewhere classified: Secondary | ICD-10-CM | POA: Diagnosis not present

## 2018-11-26 DIAGNOSIS — M25572 Pain in left ankle and joints of left foot: Secondary | ICD-10-CM | POA: Diagnosis not present

## 2018-11-28 NOTE — Progress Notes (Signed)
   Subjective:  Patient presents today status post peroneal tenosynovectomy left. DOS: 10/30/2018. She states she is doing well and improving. She reports a decrease in the pain and swelling. She has been using the compression anklet and CAM boot as directed. Patient is here for further evaluation and treatment.    Past Medical History:  Diagnosis Date  . Depression   . Dysrhythmia    tachycardia  . Gastric ulcer   . 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 Hypertension       Objective/Physical Exam Neurovascular status intact.  Skin incisions appear to be well coapted. No sign of infectious process noted. No dehiscence. No active bleeding noted. Moderate edema noted to the surgical extremity.  Assessment: 1. s/p peroneal tenosynovectomy left. DOS: 10/30/2018   Plan of Care:  1. Patient was evaluated.  2. Compression anklet dispensed.  3. Begin weightbearing in CAM boot.  4. Physical therapy ordered at [place].  5. Return to clinic in 4 weeks.    Edrick Kins, DPM [facility]  Dr. Edrick Kins, [place]                                        [place], [place] [address]                Office [phone]  Fax [phone]

## 2018-12-01 DIAGNOSIS — R262 Difficulty in walking, not elsewhere classified: Secondary | ICD-10-CM | POA: Diagnosis not present

## 2018-12-01 DIAGNOSIS — M25572 Pain in left ankle and joints of left foot: Secondary | ICD-10-CM | POA: Diagnosis not present

## 2018-12-02 NOTE — Telephone Encounter (Signed)
Patient is on for 01/07/2019

## 2018-12-02 NOTE — Telephone Encounter (Signed)
Patient seen Dr Gabriel Carina for this on 10/26 and she will be following patient for her diabetes.

## 2018-12-05 ENCOUNTER — Other Ambulatory Visit: Payer: Self-pay | Admitting: Internal Medicine

## 2018-12-05 DIAGNOSIS — M542 Cervicalgia: Secondary | ICD-10-CM

## 2018-12-08 DIAGNOSIS — R262 Difficulty in walking, not elsewhere classified: Secondary | ICD-10-CM | POA: Diagnosis not present

## 2018-12-08 DIAGNOSIS — M25572 Pain in left ankle and joints of left foot: Secondary | ICD-10-CM | POA: Diagnosis not present

## 2018-12-09 ENCOUNTER — Encounter: Payer: Self-pay | Admitting: Dietician

## 2018-12-09 ENCOUNTER — Other Ambulatory Visit: Payer: Self-pay

## 2018-12-09 ENCOUNTER — Encounter: Payer: BC Managed Care – PPO | Attending: Internal Medicine | Admitting: Dietician

## 2018-12-09 VITALS — BP 164/86 | Ht 66.0 in | Wt 222.5 lb

## 2018-12-09 DIAGNOSIS — E119 Type 2 diabetes mellitus without complications: Secondary | ICD-10-CM | POA: Insufficient documentation

## 2018-12-09 NOTE — Patient Instructions (Addendum)
  Check blood sugars 2 x day before breakfast and 2 hrs after supper every day and record Bring blood sugar records to the next appointment/class Continue Physical Therapy and exercise as per MD Eat 3 meals day and 2  snacks a day in afternoon and at bedtime Space meals 4-6 hours apart Eat 2-3 carbohydrate servings/meal + protein Eat 1 carbohydrate serving/snack + protein Drink plenty of water Avoid sugar sweetened drinks (soda, tea, coffee, sports drinks, juices) Limit intake of sweets, fried foods and snack foods Get a Sharps container Return for appointment/classes on:  12-25-18

## 2018-12-09 NOTE — Progress Notes (Signed)
Diabetes Self-Management Education  Visit Type: First/Initial  Appt. Start Time: 1330 Appt. End Time: 1430  12/09/2018  Gabrielle Tucker, identified by name and date of birth, is a 58 y.o. female with a diagnosis of Diabetes: Type 2.   ASSESSMENT  Blood pressure (!) 164/86, height 5\' 6"  (1.676 m), weight 222 lb 8 oz (100.9 kg). Body mass index is 35.91 kg/m.  Diabetes Self-Management Education - 12/09/18 1457      Visit Information   Visit Type  First/Initial      Initial Visit   Diabetes Type  Type 2      Health Coping   How would you rate your overall health?  Good      Psychosocial Assessment   Patient Belief/Attitude about Diabetes  Motivated to manage diabetes    Self-care barriers  None    Self-management support  Doctor's office;Family    Other persons present  Patient    Patient Concerns  Healthy Lifestyle;Weight Control;Glycemic Control   prevent complications   Special Needs  None    Preferred Learning Style  Auditory;Hands on    Learning Readiness  Ready    What is the last grade level you completed in school?  12      Pre-Education Assessment   Patient understands the diabetes disease and treatment process.  Needs Instruction    Patient understands incorporating nutritional management into lifestyle.  Needs Instruction    Patient undertands incorporating physical activity into lifestyle.  Needs Instruction    Patient understands using medications safely.  Needs Review    Patient understands monitoring blood glucose, interpreting and using results  Needs Review    Patient understands prevention, detection, and treatment of acute complications.  Needs Instruction    Patient understands prevention, detection, and treatment of chronic complications.  Needs Instruction    Patient understands how to develop strategies to address psychosocial issues.  Needs Instruction    Patient understands how to develop strategies to promote health/change behavior.  Needs  Instruction      Complications   Last HgB A1C per patient/outside source  11.3 %   10-2018   How often do you check your blood sugar?  1-2 times/day    Have you had a dilated eye exam in the past 12 months?  Yes   11-2018   Have you had a dental exam in the past 12 months?  Yes   09-2018   Are you checking your feet?  No      Dietary Intake   Breakfast  eats oatmeal, cold cereal/milk, bagel or meat biscuit at 8a    Snack (morning)  eats fruit at 11a    Lunch  eats sandwich at 1p    Snack (afternoon)  eats fruit, popcorn or peanut butter crackers at 3p    Dinner  eats supper at 6p=usually meat/vegetables; eats fried foods 2-3x/wk    Snack (evening)  eats fruit, popcorn or sugar free ice cream at 10p    Beverage(s)  drinks water 4-5x/day, diet Welch's drink 1x/day, milk 1-2x/day      Exercise   Exercise Type  ADL's   doing Physical Therapy 2x/wk s/p left foot tendon repair in 10-2018   How many days per week to you exercise?  2   PT   How many minutes per day do you exercise?  60    Total minutes per week of exercise  120      Patient Education   Previous Diabetes Education  No    Disease state   Definition of diabetes, type 1 and 2, and the diagnosis of diabetes    Nutrition management   Role of diet in the treatment of diabetes and the relationship between the three main macronutrients and blood glucose level;Food label reading, portion sizes and measuring food.;Carbohydrate counting    Physical activity and exercise   Role of exercise on diabetes management, blood pressure control and cardiac health.;Helped patient identify appropriate exercises in relation to his/her diabetes, diabetes complications and other health issue.    Medications  Reviewed patients medication for diabetes, action, purpose, timing of dose and side effects.    Monitoring  Purpose and frequency of SMBG.;Identified appropriate SMBG and/or A1C goals.;Yearly dilated eye exam;Taught/discussed recording of test  results and interpretation of SMBG.    Acute complications  Taught treatment of hypoglycemia - the 15 rule.    Chronic complications  Relationship between chronic complications and blood glucose control;Dental care;Retinopathy and reason for yearly dilated eye exams;Reviewed with patient heart disease, higher risk of, and prevention    Personal strategies to promote health  Lifestyle issues that need to be addressed for better diabetes care;Helped patient develop diabetes management plan for (enter comment)      Outcomes   Expected Outcomes  Demonstrated interest in learning. Expect positive outcomes       Individualized Plan for Diabetes Self-Management Training:   Learning Objective:  Patient will have a greater understanding of diabetes self-management. Patient education plan is to attend individual and/or group sessions per assessed needs and concerns.   Plan:   Patient Instructions   Check blood sugars 2 x day before breakfast and 2 hrs after supper every day and record Bring blood sugar records to the next appointment/class Continue Physical Therapy and exercise as per MD  Eat 3 meals day and 2  snacks a day in afternoon and at bedtime Space meals 4-6 hours apart Eat 2-3 carbohydrate servings/meal + protein Eat 1 carbohydrate serving/snack + protein Drink plenty of water Avoid sugar sweetened drinks (soda, tea, coffee, sports drinks, juices) Limit intake of sweets, fried foods and snack foods Get a Sharps container Return for appointment/classes on:  12-25-18   Expected Outcomes:  Demonstrated interest in learning. Expect positive outcomes  Education material provided: General meal planning guidelines, Food Group handout   If problems or questions, patient to contact team via: [phone]  Future DSME appointment:  12-25-18

## 2018-12-11 DIAGNOSIS — R262 Difficulty in walking, not elsewhere classified: Secondary | ICD-10-CM | POA: Diagnosis not present

## 2018-12-11 DIAGNOSIS — M25572 Pain in left ankle and joints of left foot: Secondary | ICD-10-CM | POA: Diagnosis not present

## 2018-12-12 LAB — HM DIABETES EYE EXAM

## 2018-12-16 DIAGNOSIS — R262 Difficulty in walking, not elsewhere classified: Secondary | ICD-10-CM | POA: Diagnosis not present

## 2018-12-16 DIAGNOSIS — M25572 Pain in left ankle and joints of left foot: Secondary | ICD-10-CM | POA: Diagnosis not present

## 2018-12-17 DIAGNOSIS — C73 Malignant neoplasm of thyroid gland: Secondary | ICD-10-CM | POA: Diagnosis not present

## 2018-12-17 DIAGNOSIS — B373 Candidiasis of vulva and vagina: Secondary | ICD-10-CM | POA: Diagnosis not present

## 2018-12-17 DIAGNOSIS — E890 Postprocedural hypothyroidism: Secondary | ICD-10-CM | POA: Diagnosis not present

## 2018-12-17 DIAGNOSIS — E119 Type 2 diabetes mellitus without complications: Secondary | ICD-10-CM | POA: Diagnosis not present

## 2018-12-22 DIAGNOSIS — R262 Difficulty in walking, not elsewhere classified: Secondary | ICD-10-CM | POA: Diagnosis not present

## 2018-12-22 DIAGNOSIS — M25572 Pain in left ankle and joints of left foot: Secondary | ICD-10-CM | POA: Diagnosis not present

## 2018-12-23 ENCOUNTER — Encounter: Payer: Self-pay | Admitting: Podiatry

## 2018-12-23 ENCOUNTER — Ambulatory Visit (INDEPENDENT_AMBULATORY_CARE_PROVIDER_SITE_OTHER): Payer: BC Managed Care – PPO | Admitting: Podiatry

## 2018-12-23 ENCOUNTER — Other Ambulatory Visit: Payer: Self-pay

## 2018-12-23 DIAGNOSIS — Z9889 Other specified postprocedural states: Secondary | ICD-10-CM

## 2018-12-23 DIAGNOSIS — M7672 Peroneal tendinitis, left leg: Secondary | ICD-10-CM

## 2018-12-25 ENCOUNTER — Encounter: Payer: BC Managed Care – PPO | Attending: Internal Medicine | Admitting: Dietician

## 2018-12-25 ENCOUNTER — Encounter: Payer: Self-pay | Admitting: Dietician

## 2018-12-25 ENCOUNTER — Other Ambulatory Visit: Payer: Self-pay

## 2018-12-25 VITALS — Ht 66.0 in | Wt 222.7 lb

## 2018-12-25 DIAGNOSIS — E119 Type 2 diabetes mellitus without complications: Secondary | ICD-10-CM

## 2018-12-25 NOTE — Progress Notes (Signed)
Appt. Start Time: 0900 Appt. End Time: 1100  Class 1 Diabetes Overview - define DM; state own type of DM; identify functions of pancreas and insulin; define insulin deficiency vs insulin resistance  Psychosocial - identify DM as a source of stress; state the effects of stress on BG control  Nutritional Management - describe effects of food on blood glucose; identify sources of carbohydrate, protein and fat; verbalize the importance of balance meals in controlling blood glucose  Exercise - describe the effects of exercise on blood glucose and importance of regular exercise in controlling diabetes; state a plan for personal exercise; verbalize contraindications for exercise  Self-Monitoring - state importance of SMBG; use SMBG results to effectively manage diabetes; identify importance of regular HbA1C testing and goals for results  Acute Complications - recognize hyperglycemia and hypoglycemia with causes and effects; identify blood glucose results as high, low or in control; list steps in treating and preventing high and low blood glucose  Chronic Complications/Foot, Skin, Eye Dental Care - identify possible long-term complications of diabetes (retinopathy, neuropathy, nephropathy, cardiovascular disease, infections); state importance of daily self-foot exams; describe how to examine feet and what to look for; explain appropriate eye and dental care  Lifestyle Changes/Goals & Health/Community Resources - state benefits of making appropriate lifestyle changes; identify habits that need to change (meals, tobacco, alcohol); identify strategies to reduce risk factors for personal health  Pregnancy/Sexual Health - define gestational diabetes; state importance of good blood glucose control and birth control prior to pregnancy; state importance of good blood glucose control in preventing sexual problems (impotence, vaginal dryness, infections, loss of desire); state relationship of blood glucose control  and pregnancy outcome; describe risk of maternal and fetal complications  Teaching Materials Used: Class 1 Slides/Notebook Diabetes Booklet ID Card  Medic Alert/Medic ID Forms Sleep Evaluation Exercise Handout Planning a Balanced Meal Goals for Class 1

## 2018-12-26 NOTE — Progress Notes (Signed)
   Subjective:  Patient presents today status post peroneal tenosynovectomy left. DOS: 10/30/2018. She states she is doing well and improving. Being on the foot increases the pain and swelling though. She has been using the CAM boot and the compression anklet as directed. Patient is here for further evaluation and treatment.    Past Medical History:  Diagnosis Date  . Depression   . Dysrhythmia    tachycardia  . Gastric ulcer   . 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 Hypertension       Objective/Physical Exam Neurovascular status intact.  Skin incisions appear to be well coapted. No sign of infectious process noted. No dehiscence. No active bleeding noted. Moderate edema noted to the surgical extremity.  Assessment: 1. s/p peroneal tenosynovectomy left. DOS: 10/30/2018   Plan of Care:  1. Patient was evaluated.  2. Unna boot applied.  3. Continue weightbearing in CAM boot.  4. Continue physical therapy.  5. Return to clinic in 3 weeks.    Edrick Kins, DPM [facility]  Dr. Edrick Kins, [address]                                        [place], [place] [address]                Office [phone]  Fax [phone]

## 2019-01-01 ENCOUNTER — Encounter: Payer: BC Managed Care – PPO | Admitting: Dietician

## 2019-01-01 ENCOUNTER — Encounter: Payer: Self-pay | Admitting: Dietician

## 2019-01-01 ENCOUNTER — Other Ambulatory Visit: Payer: Self-pay

## 2019-01-01 VITALS — Ht 66.0 in | Wt 220.6 lb

## 2019-01-01 DIAGNOSIS — E119 Type 2 diabetes mellitus without complications: Secondary | ICD-10-CM

## 2019-01-01 NOTE — Progress Notes (Signed)
Appt. Start Time: 0900 Appt. End Time: 1115  Class 2 Nutritional Management - identify sources of carbohydrate, protein and fat; plan balanced meals; estimate servings of carbohydrates in meals  Psychosocial - identify DM as a source of stress; state the effects of stress on BG control  Exercise - describe the effects of exercise on blood glucose and importance of regular exercise in controlling diabetes; state a plan for personal exercise; verbalize contraindications for exercise  Self-Monitoring - state importance of SMBG; use SMBG results to effectively manage diabetes; identify importance of regular HbA1C testing and goals for results  Acute Complications - recognize hyperglycemia and hypoglycemia with causes and effects; identify blood glucose results as high, low or in control; list steps in treating and preventing high and low blood glucose  Sick Day Guidelines: state appropriate measure to manage blood glucose when ill (need for meds, HBGM plan, when to call physician, need for fluids)  Chronic Complications/Foot, Skin, Eye Dental Care - identify possible long-term complications of diabetes (retinopathy, neuropathy, nephropathy, cardiovascular disease, infections); explain steps in prevention and treatment of chronic complications; state importance of daily self-foot exams; describe how to examine feet and what to look for; explain appropriate eye and dental care  Lifestyle Changes/Goals - state benefits of making appropriate lifestyle changes; identify habits that need to change (meals, tobacco, alcohol); identify strategies to reduce risk factors for personal health  Pregnancy/Sexual Health - state importance of good blood glucose control in preventing sexual problems (impotence, vaginal dryness, infections, loss of desire)  Teaching Materials Used: Class 2 Slide Packet A1C Pamphlet Foot Care Literature Kidney Test Handout Stroke Card Quick and "Balanced" Meal Ideas Carb  Counting and Meal Planning Book Goals for Class 2

## 2019-01-02 ENCOUNTER — Other Ambulatory Visit: Payer: Self-pay | Admitting: Internal Medicine

## 2019-01-05 ENCOUNTER — Other Ambulatory Visit: Payer: Self-pay | Admitting: Internal Medicine

## 2019-01-07 ENCOUNTER — Ambulatory Visit (INDEPENDENT_AMBULATORY_CARE_PROVIDER_SITE_OTHER): Payer: BC Managed Care – PPO | Admitting: Internal Medicine

## 2019-01-07 ENCOUNTER — Other Ambulatory Visit: Payer: Self-pay

## 2019-01-07 ENCOUNTER — Encounter: Payer: Self-pay | Admitting: Internal Medicine

## 2019-01-07 VITALS — BP 124/70 | HR 75 | Ht 66.0 in | Wt 219.0 lb

## 2019-01-07 DIAGNOSIS — F39 Unspecified mood [affective] disorder: Secondary | ICD-10-CM | POA: Insufficient documentation

## 2019-01-07 DIAGNOSIS — Z0000 Encounter for general adult medical examination without abnormal findings: Secondary | ICD-10-CM | POA: Diagnosis not present

## 2019-01-07 DIAGNOSIS — Z1231 Encounter for screening mammogram for malignant neoplasm of breast: Secondary | ICD-10-CM | POA: Diagnosis not present

## 2019-01-07 DIAGNOSIS — R000 Tachycardia, unspecified: Secondary | ICD-10-CM | POA: Diagnosis not present

## 2019-01-07 DIAGNOSIS — E785 Hyperlipidemia, unspecified: Secondary | ICD-10-CM | POA: Diagnosis not present

## 2019-01-07 DIAGNOSIS — Z23 Encounter for immunization: Secondary | ICD-10-CM | POA: Diagnosis not present

## 2019-01-07 DIAGNOSIS — E119 Type 2 diabetes mellitus without complications: Secondary | ICD-10-CM | POA: Insufficient documentation

## 2019-01-07 DIAGNOSIS — C73 Malignant neoplasm of thyroid gland: Secondary | ICD-10-CM

## 2019-01-07 DIAGNOSIS — Z8711 Personal history of peptic ulcer disease: Secondary | ICD-10-CM

## 2019-01-07 DIAGNOSIS — E118 Type 2 diabetes mellitus with unspecified complications: Secondary | ICD-10-CM | POA: Diagnosis not present

## 2019-01-07 LAB — POCT URINALYSIS DIPSTICK
Bilirubin, UA: NEGATIVE
Blood, UA: NEGATIVE
Glucose, UA: POSITIVE — AB
Ketones, UA: NEGATIVE
Leukocytes, UA: NEGATIVE
Nitrite, UA: NEGATIVE
Protein, UA: NEGATIVE
Spec Grav, UA: 1.02 (ref 1.010–1.025)
Urobilinogen, UA: 0.2 E.U./dL
pH, UA: 5 (ref 5.0–8.0)

## 2019-01-07 LAB — MICROALBUMIN, URINE: Microalb, Ur: NORMAL

## 2019-01-07 MED ORDER — PANTOPRAZOLE SODIUM 40 MG PO TBEC: 40.0000 mg | DELAYED_RELEASE_TABLET | Freq: Two times a day (BID) | ORAL | 3 refills | Status: DC

## 2019-01-07 NOTE — Progress Notes (Signed)
Date:  01/07/2019   Name:  Gabrielle Tucker   DOB:  December 29, 1960   MRN:  [id number]   Chief Complaint: Annual Exam (Breast Exam.) and Diabetes (MICRO and flu shot.  ) Gabrielle Tucker is a 58 y.o. female who presents today for her Complete Annual Exam. She feels well. She reports exercising none due to recent foot surgery. She reports she is sleeping fairly well. She denies breast issues.   Mammogram  07/2016 Colonoscopy 09/2016 Immunization History  Administered Date(s) Administered  . Influenza,inj,Quad PF,6+ Mos 10/30/2017  . Influenza-Unspecified 10/23/2014, 11/07/2015, 10/30/2016     Diabetes She presents for her follow-up diabetic visit. She has type 2 (new onset - followed by Endocrinology) diabetes mellitus. Her disease course has been improving. Pertinent negatives for hypoglycemia include no dizziness, headaches, nervousness/anxiousness or tremors. Pertinent negatives for diabetes include no chest pain, no fatigue, no polydipsia and no polyuria. Current diabetic treatment includes oral agent (dual therapy) (metformin and farxiga). Her weight is decreasing steadily. She is following a diabetic diet. She monitors blood glucose at home 1-2 x per day. Her breakfast blood glucose is taken between 6-7 am. Her breakfast blood glucose range is generally 90-110 mg/dl. An ACE inhibitor/angiotensin II receptor blocker is not being taken.  Hyperlipidemia This is a chronic problem. The problem is uncontrolled. Pertinent negatives include no chest pain or shortness of breath. She is currently on no antihyperlipidemic treatment.  Thyroid Problem Presents for follow-up (followed by endo - recent labs were normal) visit. Patient reports no anxiety, constipation, diarrhea, fatigue, palpitations or tremors. The symptoms have been stable. Her past medical history is significant for hyperlipidemia.    Lab Results  Component Value Date   CREATININE 0.73 12/12/2017   BUN 12 12/12/2017   NA 139  12/12/2017   K 4.3 12/12/2017   CL 102 12/12/2017   CO2 31 12/12/2017   Lab Results  Component Value Date   CHOL 205 (H) 08/02/2016   HDL 43 08/02/2016   LDLCALC 120 (H) 08/02/2016   TRIG 212 (H) 08/02/2016   CHOLHDL 4.8 (H) 08/02/2016   Lab Results  Component Value Date   TSH 0.95 10/07/2018   Lab Results  Component Value Date   HGBA1C 11.3 11/10/2018     Review of Systems  Constitutional: Positive for unexpected weight change (doing well with diet). Negative for chills, fatigue and fever.  HENT: Positive for drooling. Negative for congestion, hearing loss, tinnitus, trouble swallowing and voice change.   Eyes: Negative for visual disturbance.  Respiratory: Negative for cough, chest tightness, shortness of breath and wheezing.   Cardiovascular: Negative for chest pain, palpitations and leg swelling.  Gastrointestinal: Negative for abdominal pain, constipation, diarrhea and vomiting.  Endocrine: Negative for polydipsia and polyuria.  Genitourinary: Negative for dysuria, frequency, genital sores, vaginal bleeding and vaginal discharge.  Musculoskeletal: Negative for arthralgias, gait problem and joint swelling.  Skin: Negative for color change and rash.  Neurological: Negative for dizziness, tremors, light-headedness and headaches.  Hematological: Negative for adenopathy. Does not bruise/bleed easily.  Psychiatric/Behavioral: Negative for dysphoric mood and sleep disturbance. The patient is not nervous/anxious.     Patient Active Problem List   Diagnosis Date Noted  . Type II diabetes mellitus with complication ([place]) 123XX123  . S/P total thyroidectomy 12/16/2017  . 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 Multiple thyroid nodules 11/12/2017  . Hyperlipidemia, mild 08/03/2016  . Arthritis of knee, left 08/02/2016  . Back muscle spasm 07/03/2014  .  H/O peptic ulcer 07/03/2014  . Migraine without aura and responsive to treatment 07/03/2014  . Muscle  contraction headache 07/03/2014  . Idiopathic insomnia 07/03/2014  . Tachycardia 07/03/2014    Allergies  Allergen Reactions  . Morphine And Related Nausea And Vomiting  . Tape Hives and Rash    No [paper tape.  [facility] derm OK    Past Surgical History:  Procedure Laterality Date  . ABDOMINAL HYSTERECTOMY    . ANTERIOR FUSION CERVICAL SPINE  2006  . BREAST REDUCTION SURGERY    . ESOPHAGOGASTRODUODENOSCOPY  2011   gastric ulcer  . LITHOTRIPSY  2012   with setnet  . LUMBAR DISC SURGERY    . NASAL SINUS SURGERY  2015  . partail knee replacement Bilateral   . REDUCTION MAMMAPLASTY Bilateral 2011  . THYROIDECTOMY Bilateral 12/16/2017   Procedure: THYROIDECTOMY;  Surgeon: Margaretha Sheffield, MD;  Location: [facility] ORS;  Service: ENT;  Laterality: Bilateral;    Social History   Tobacco Use  . Smoking status: Never Smoker  . Smokeless tobacco: Never Used  Substance Use Topics  . Alcohol use: Yes    Alcohol/week: 0.0 - 1.0 standard drinks    Comment: on occassion  . Drug use: Not Currently    Types: Marijuana     Medication list has been reviewed and updated.  Current Meds  Medication Sig  . calcium-vitamin D (OSCAL WITH D) 500-200 MG-UNIT tablet Take 2 tablets by mouth 2 (two) times daily.  Marland Kitchen escitalopram (LEXAPRO) 10 MG tablet TAKE 1 TABLET(10 MG) BY MOUTH DAILY  . FARXIGA 10 MG TABS tablet Take 1 tablet by mouth daily.  . fluticasone (FLONASE) 50 MCG/ACT nasal spray Place 2 sprays into both nostrils daily.  . hydrOXYzine (ATARAX/VISTARIL) 25 MG tablet TK 1 TO 2 TS PO QHS  . ibuprofen (ADVIL,MOTRIN) 600 MG tablet Take 1 tablet (600 mg total) by mouth every 6 (six) hours as needed.  Marland Kitchen levothyroxine (SYNTHROID) 175 MCG tablet   . metFORMIN (GLUCOPHAGE-XR) 500 MG 24 hr tablet Take 3 tablets by mouth daily.   . metoprolol succinate (TOPROL-XL) 50 MG 24 hr tablet TAKE 1 TABLET BY MOUTH EVERY DAY  . pantoprazole (PROTONIX) 40 MG tablet TAKE 1 TABLET(40 MG) BY MOUTH TWICE DAILY  .  tiZANidine (ZANAFLEX) 4 MG tablet TAKE 1 TABLET(4 MG) BY MOUTH DAILY AS NEEDED FOR MUSCLE SPASMS  . zaleplon (SONATA) 10 MG capsule TAKE 1 CAPSULE(10 MG) BY MOUTH AT BEDTIME    PHQ 2/9 Scores 01/07/2019 12/09/2018 03/20/2018 06/21/2017  PHQ - 2 Score 0 0 0 4  PHQ- 9 Score - - - 22    BP Readings from Last 3 Encounters:  01/07/19 124/70  12/09/18 (!) 164/86  03/20/18 128/80    Physical Exam Vitals and nursing note reviewed.  Constitutional:      General: She is not in acute distress.    Appearance: She is well-developed.  HENT:     Head: Normocephalic and atraumatic.     Right Ear: Tympanic membrane and ear canal normal.     Left Ear: Tympanic membrane and ear canal normal.     Nose:     Right Sinus: No maxillary sinus tenderness.     Left Sinus: No maxillary sinus tenderness.  Eyes:     General: No scleral icterus.       Right eye: No discharge.        Left eye: No discharge.     Conjunctiva/sclera: Conjunctivae normal.  Neck:  Thyroid: No thyromegaly.     Vascular: No carotid bruit.  Cardiovascular:     Rate and Rhythm: Normal rate and regular rhythm.     Pulses: Normal pulses.     Heart sounds: Normal heart sounds.  Pulmonary:     Effort: Pulmonary effort is normal. No respiratory distress.     Breath sounds: No wheezing.  Chest:     Breasts:        Right: No mass, nipple discharge, skin change or tenderness.        Left: No mass, nipple discharge, skin change or tenderness.     Comments: Bilateral breast reduction scars healed Abdominal:     General: Bowel sounds are normal.     Palpations: Abdomen is soft.     Tenderness: There is no abdominal tenderness.  Musculoskeletal:        General: Normal range of motion.     Cervical back: Normal range of motion. No erythema.     Comments: Surgical boot on left foot/ankle   Lymphadenopathy:     Cervical: No cervical adenopathy.  Skin:    General: Skin is warm and dry.     Findings: No rash.  Neurological:      Mental Status: She is alert and oriented to person, place, and time.     Cranial Nerves: No cranial nerve deficit.     Sensory: No sensory deficit.     Deep Tendon Reflexes: Reflexes are normal and symmetric.  Psychiatric:        Attention and Perception: Attention normal.        Mood and Affect: Mood normal.        Speech: Speech normal.        Behavior: Behavior normal.        Thought Content: Thought content normal.     Wt Readings from Last 3 Encounters:  01/07/19 219 lb (99.3 kg)  01/01/19 220 lb 9.6 oz (100.1 kg)  12/25/18 222 lb 11.2 oz (101 kg)    BP 124/70   Pulse 75   Ht 5\' 6"  (1.676 m)   Wt 219 lb (99.3 kg)   SpO2 96%   BMI 35.35 kg/m   Assessment and Plan: 1. Annual physical exam Normal exam Doing great with weight loss - resume exercise when recovered from surgery - POCT urinalysis dipstick  2. Encounter for screening mammogram for breast cancer - MM 3D SCREEN BREAST BILATERAL; Future  3. Type II diabetes mellitus with complication ([place]) New onset - doing very well on medications, glucoses much improved 500 ->100.  No side effects to medications so far. Recently had eye exam - will request records Patient advised regarding statin therapy, [id number] vaccination and possibly ACE/ARB for renal protection - Hemoglobin A1c - Comprehensive metabolic panel - Microalbumin / creatinine urine ratio  4. Hyperlipidemia, mild Will likely start Lipitor 10 mg once labs return with normal renal and hepatic funciton - Lipid panel  5. Tachycardia Controlled on metoprolol without chest pain, dizziness or other symptoms  - CBC with Differential/Platelet  6. H/O peptic ulcer Maintained on BID PPI with no recurrence of symptoms. - CBC with Differential/Platelet - pantoprazole (PROTONIX) 40 MG tablet; Take 1 tablet (40 mg total) by mouth 2 (two) times daily.  Dispense: 180 tablet; Refill: 3  7. Need for immunization against influenza - Flu Vaccine QUAD 36+ mos IM  8.  Mood disorder ([place]) Clinically stable on daily SSRI.  No symptoms of SI/HI.  No side effects. Will  continue Lexapro 10 mg daily  9. Thyroid cancer [facility]) Recent follow up with Endocrinology - normal labs; thyroid dose being adjusted   Partially dictated using Editor, commissioning. Any errors are unintentional.  Halina Maidens, MD [facility]  01/07/2019

## 2019-01-08 ENCOUNTER — Telehealth: Payer: Self-pay | Admitting: *Deleted

## 2019-01-08 ENCOUNTER — Other Ambulatory Visit: Payer: Self-pay | Admitting: Internal Medicine

## 2019-01-08 DIAGNOSIS — E785 Hyperlipidemia, unspecified: Secondary | ICD-10-CM

## 2019-01-08 LAB — COMPREHENSIVE METABOLIC PANEL
ALT: 39 IU/L — ABNORMAL HIGH (ref 0–32)
AST: 41 IU/L — ABNORMAL HIGH (ref 0–40)
Albumin/Globulin Ratio: 1.9 (ref 1.2–2.2)
Albumin: 4.4 g/dL (ref 3.8–4.9)
Alkaline Phosphatase: 123 IU/L — ABNORMAL HIGH (ref 39–117)
BUN/Creatinine Ratio: 14 (ref 9–23)
BUN: 10 mg/dL (ref 6–24)
Bilirubin Total: 0.5 mg/dL (ref 0.0–1.2)
CO2: 23 mmol/L (ref 20–29)
Calcium: 9.1 mg/dL (ref 8.7–10.2)
Chloride: 104 mmol/L (ref 96–106)
Creatinine, Ser: 0.71 mg/dL (ref 0.57–1.00)
GFR calc Af Amer: 109 mL/min/{1.73_m2} (ref >59)
GFR calc non Af Amer: 94 mL/min/{1.73_m2} (ref >59)
Globulin, Total: 2.3 g/dL (ref 1.5–4.5)
Glucose: 109 mg/dL — ABNORMAL HIGH (ref 65–99)
Potassium: 4.2 mmol/L (ref 3.5–5.2)
Sodium: 141 mmol/L (ref 134–144)
Total Protein: 6.7 g/dL (ref 6.0–8.5)

## 2019-01-08 LAB — CBC WITH DIFFERENTIAL/PLATELET
Basophils Absolute: 0 10*3/uL (ref 0.0–0.2)
Basos: 0 %
EOS (ABSOLUTE): 0.1 10*3/uL (ref 0.0–0.4)
Eos: 2 %
Hematocrit: 42.2 % (ref 34.0–46.6)
Hemoglobin: 13.8 g/dL (ref 11.1–15.9)
Immature Grans (Abs): 0 10*3/uL (ref 0.0–0.1)
Immature Granulocytes: 0 %
Lymphocytes Absolute: 2.1 10*3/uL (ref 0.7–3.1)
Lymphs: 27 %
MCH: 30.2 pg (ref 26.6–33.0)
MCHC: 32.7 g/dL (ref 31.5–35.7)
MCV: 92 fL (ref 79–97)
Monocytes Absolute: 0.5 10*3/uL (ref 0.1–0.9)
Monocytes: 7 %
Neutrophils Absolute: 5 10*3/uL (ref 1.4–7.0)
Neutrophils: 64 %
Platelets: 186 10*3/uL (ref 150–450)
RBC: 4.57 x10E6/uL (ref 3.77–5.28)
RDW: 13.1 % (ref 11.7–15.4)
WBC: 7.8 10*3/uL (ref 3.4–10.8)

## 2019-01-08 LAB — LIPID PANEL
Chol/HDL Ratio: 4.6 ratio — ABNORMAL HIGH (ref 0.0–4.4)
Cholesterol, Total: 196 mg/dL (ref 100–199)
HDL: 43 mg/dL (ref >39)
LDL Chol Calc (NIH): 126 mg/dL — ABNORMAL HIGH (ref 0–99)
Triglycerides: 150 mg/dL — ABNORMAL HIGH (ref 0–149)
VLDL Cholesterol Cal: 27 mg/dL (ref 5–40)

## 2019-01-08 LAB — HEMOGLOBIN A1C
Est. average glucose Bld gHb Est-mCnc: 166 mg/dL
Hgb A1c MFr Bld: 7.4 % — ABNORMAL HIGH (ref 4.8–5.6)

## 2019-01-08 MED ORDER — ATORVASTATIN CALCIUM 10 MG PO TABS: 10.0000 mg | ORAL_TABLET | Freq: Every day | ORAL | 3 refills | Status: DC

## 2019-01-08 NOTE — Telephone Encounter (Signed)
Patient left a voice mail that she was sick and not able to attend class. Called patient and she reports she has sinus issues today. She is not able to reschedule diabetes Class 3 because she will be returning to work in January. She was given the option of evening or morning class. Instructed her to call back if anything changes since her referral is good for a year. She did see her PCP yesterday and her A1C was much improved - 7.4 %.

## 2019-01-08 NOTE — Progress Notes (Unsigned)
l °

## 2019-01-12 ENCOUNTER — Encounter: Payer: Self-pay | Admitting: Dietician

## 2019-01-13 ENCOUNTER — Other Ambulatory Visit: Payer: Self-pay

## 2019-01-13 ENCOUNTER — Ambulatory Visit (INDEPENDENT_AMBULATORY_CARE_PROVIDER_SITE_OTHER): Payer: BC Managed Care – PPO | Admitting: Podiatry

## 2019-01-13 ENCOUNTER — Encounter: Payer: Self-pay | Admitting: Podiatry

## 2019-01-13 DIAGNOSIS — Z9889 Other specified postprocedural states: Secondary | ICD-10-CM

## 2019-01-13 DIAGNOSIS — M7672 Peroneal tendinitis, left leg: Secondary | ICD-10-CM

## 2019-01-19 NOTE — Progress Notes (Signed)
   Subjective:  Patient presents today status post peroneal tenosynovectomy left. DOS: 10/30/2018. She states she is doing well. She reports some mild pain to the lateral aspect of the foot. She reports some associated swelling. She admits that she does not wear the CAM boot around the house. There are no modifying factors noted. Patient is here for further evaluation and treatment.    Past Medical History:  Diagnosis Date  . Depression   . Dysrhythmia    tachycardia  . Gastric ulcer   . 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 Hypertension       Objective/Physical Exam Neurovascular status intact.  Skin incisions appear to be well coapted. No sign of infectious process noted. No dehiscence. No active bleeding noted. Moderate edema noted to the surgical extremity.  Assessment: 1. s/p peroneal tenosynovectomy left. DOS: 10/30/2018   Plan of Care:  1. Patient was evaluated.  2. Discontinue using CAM boot.  3. Ankle brace dispensed.  4. Patient completed physical therapy.  5. Recommended good shoe gear.  6. Return to clinic in 4 weeks.    Edrick Kins, DPM [facility]  Dr. Edrick Kins, [address]                                        [place], [place] [address]                Office [phone]  Fax [phone]

## 2019-01-27 ENCOUNTER — Other Ambulatory Visit: Payer: Self-pay

## 2019-01-27 DIAGNOSIS — Z1231 Encounter for screening mammogram for malignant neoplasm of breast: Secondary | ICD-10-CM

## 2019-02-05 ENCOUNTER — Other Ambulatory Visit: Payer: Self-pay

## 2019-02-05 ENCOUNTER — Ambulatory Visit
Admission: RE | Admit: 2019-02-05 | Discharge: 2019-02-05 | Disposition: A | Discharge status: Home / Self Care | Payer: BC Managed Care – PPO | Source: Ambulatory Visit | Attending: Internal Medicine | Admitting: Internal Medicine

## 2019-02-05 DIAGNOSIS — Z1231 Encounter for screening mammogram for malignant neoplasm of breast: Secondary | ICD-10-CM | POA: Insufficient documentation

## 2019-02-05 HISTORY — DX: Personal history of irradiation: Z92.3

## 2019-02-10 ENCOUNTER — Other Ambulatory Visit: Payer: Self-pay

## 2019-02-10 ENCOUNTER — Encounter: Payer: Self-pay | Admitting: Podiatry

## 2019-02-10 ENCOUNTER — Ambulatory Visit (INDEPENDENT_AMBULATORY_CARE_PROVIDER_SITE_OTHER): Payer: BC Managed Care – PPO | Admitting: Podiatry

## 2019-02-10 DIAGNOSIS — Z9889 Other specified postprocedural states: Secondary | ICD-10-CM | POA: Diagnosis not present

## 2019-02-10 DIAGNOSIS — M7672 Peroneal tendinitis, left leg: Secondary | ICD-10-CM

## 2019-02-12 NOTE — Progress Notes (Signed)
   Subjective:  Patient presents today status post peroneal tenosynovectomy left. DOS: 10/30/2018. She states she is doing well. She reports intermittent pain only when she is on the foot for long periods of time. She has been using the compression anklet as directed. Patient is here for further evaluation and treatment.    Past Medical History:  Diagnosis Date  . Depression   . Dysrhythmia    tachycardia  . Gastric ulcer   . 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 Hypertension   . Personal history of radiation therapy    had iodine rad therapy      Objective/Physical Exam Neurovascular status intact.  Skin incisions appear to be well coapted. No sign of infectious process noted. No dehiscence. No active bleeding noted. Moderate edema noted to the surgical extremity.  Assessment: 1. s/p peroneal tenosynovectomy left. DOS: 10/30/2018   Plan of Care:  1. Patient was evaluated.  2. May resume full activity with no restrictions.  3. Recommended good shoe gear.  4. Continue using compression anklet.  5. Return to clinic as needed.    Edrick Kins, DPM [facility]  Dr. Edrick Kins, [place]                                        [place], [place] [address]                Office [phone]  Fax [phone]

## 2019-02-20 DIAGNOSIS — C73 Malignant neoplasm of thyroid gland: Secondary | ICD-10-CM | POA: Diagnosis not present

## 2019-02-20 DIAGNOSIS — E119 Type 2 diabetes mellitus without complications: Secondary | ICD-10-CM | POA: Diagnosis not present

## 2019-02-20 DIAGNOSIS — E890 Postprocedural hypothyroidism: Secondary | ICD-10-CM | POA: Diagnosis not present

## 2019-02-20 LAB — HEMOGLOBIN A1C: Hemoglobin A1C: 6.1

## 2019-03-02 DIAGNOSIS — E890 Postprocedural hypothyroidism: Secondary | ICD-10-CM | POA: Diagnosis not present

## 2019-03-02 DIAGNOSIS — E119 Type 2 diabetes mellitus without complications: Secondary | ICD-10-CM | POA: Diagnosis not present

## 2019-03-02 DIAGNOSIS — C73 Malignant neoplasm of thyroid gland: Secondary | ICD-10-CM | POA: Diagnosis not present

## 2019-03-05 ENCOUNTER — Other Ambulatory Visit: Payer: Self-pay | Admitting: Internal Medicine

## 2019-03-05 DIAGNOSIS — M542 Cervicalgia: Secondary | ICD-10-CM

## 2019-03-17 ENCOUNTER — Telehealth: Payer: Self-pay

## 2019-03-17 NOTE — Telephone Encounter (Signed)
Patient called saying she has been having diarrhea on and off, stomach cramps/pains and extreme gas issues for at least 4weeks. Thought it was atorvastatin medication but she stopped that a week ago and sxs have not gotten any better.   Patient will come in tomorrow.  CM

## 2019-03-18 ENCOUNTER — Ambulatory Visit: Payer: BC Managed Care – PPO | Admitting: Internal Medicine

## 2019-03-18 ENCOUNTER — Encounter: Payer: Self-pay | Admitting: Internal Medicine

## 2019-03-18 ENCOUNTER — Other Ambulatory Visit: Payer: Self-pay

## 2019-03-18 ENCOUNTER — Other Ambulatory Visit: Payer: Self-pay | Admitting: Internal Medicine

## 2019-03-18 VITALS — BP 148/82 | HR 77 | Temp 95.5°F | Ht 66.0 in | Wt 215.0 lb

## 2019-03-18 DIAGNOSIS — R197 Diarrhea, unspecified: Secondary | ICD-10-CM | POA: Diagnosis not present

## 2019-03-18 DIAGNOSIS — R1013 Epigastric pain: Secondary | ICD-10-CM | POA: Diagnosis not present

## 2019-03-18 DIAGNOSIS — Z8711 Personal history of peptic ulcer disease: Secondary | ICD-10-CM

## 2019-03-18 NOTE — Progress Notes (Signed)
Date:  03/18/2019   Name:  Gabrielle Tucker   DOB:  1960-12-06   MRN:  [id number]   Chief Complaint: Abdominal Pain (Diarrhea on and off. Stomach pains. Severe gas. X 3-4 weeks now. )  Abdominal Pain This is a new problem. The current episode started 1 to 4 weeks ago. The problem occurs daily. The problem has been unchanged. The pain is located in the epigastric region. The quality of the pain is a sensation of fullness. The abdominal pain does not radiate. Associated symptoms include belching, diarrhea (up to 5 watery per day), flatus and nausea. Pertinent negatives include no fever, headaches or vomiting. The pain is aggravated by eating. She has tried nothing for the symptoms.    Lab Results  Component Value Date   CREATININE 0.71 01/07/2019   BUN 10 01/07/2019   NA 141 01/07/2019   K 4.2 01/07/2019   CL 104 01/07/2019   CO2 23 01/07/2019   Lab Results  Component Value Date   CHOL 196 01/07/2019   HDL 43 01/07/2019   LDLCALC 126 (H) 01/07/2019   TRIG 150 (H) 01/07/2019   CHOLHDL 4.6 (H) 01/07/2019   Lab Results  Component Value Date   TSH 0.95 10/07/2018   Lab Results  Component Value Date   HGBA1C 7.4 (H) 01/07/2019     Review of Systems  Constitutional: Negative for chills, fatigue, fever and unexpected weight change.  Respiratory: Negative for cough, chest tightness and shortness of breath.   Cardiovascular: Negative for chest pain.  Gastrointestinal: Positive for abdominal pain, diarrhea (up to 5 watery per day), flatus and nausea. Negative for vomiting.  Neurological: Negative for dizziness, light-headedness and headaches.  Psychiatric/Behavioral: Negative for sleep disturbance.    Patient Active Problem List   Diagnosis Date Noted  . Type II diabetes mellitus with complication ([place]) 123XX123  . Mood disorder ([place]) 01/07/2019  . S/P total thyroidectomy 12/16/2017  . 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 Multiple thyroid nodules  11/12/2017  . Hyperlipidemia, mild 08/03/2016  . Arthritis of knee, left 08/02/2016  . Back muscle spasm 07/03/2014  . H/O peptic ulcer 07/03/2014  . Migraine without aura and responsive to treatment 07/03/2014  . Muscle contraction headache 07/03/2014  . Idiopathic insomnia 07/03/2014  . Tachycardia 07/03/2014    Allergies  Allergen Reactions  . Morphine And Related Nausea And Vomiting  . Tape Hives and Rash    No [paper tape.  Tega derm OK    Past Surgical History:  Procedure Laterality Date  . ABDOMINAL HYSTERECTOMY    . ANTERIOR FUSION CERVICAL SPINE  2006  . BREAST REDUCTION SURGERY    . ESOPHAGOGASTRODUODENOSCOPY  2011   gastric ulcer  . LITHOTRIPSY  2012   with setnet  . LUMBAR DISC SURGERY    . NASAL SINUS SURGERY  2015  . partail knee replacement Bilateral   . REDUCTION MAMMAPLASTY Bilateral 2011  . THYROIDECTOMY Bilateral 12/16/2017   Procedure: THYROIDECTOMY;  Surgeon: Margaretha Sheffield, MD;  Location: [facility] ORS;  Service: ENT;  Laterality: Bilateral;    Social History   Tobacco Use  . Smoking status: Never Smoker  . Smokeless tobacco: Never Used  Substance Use Topics  . Alcohol use: Yes    Alcohol/week: 0.0 - 1.0 standard drinks    Comment: on occassion  . Drug use: Not Currently    Types: Marijuana     Medication list has been reviewed and updated.  Current Meds  Medication Sig  .  calcium-vitamin D (OSCAL WITH D) 500-200 MG-UNIT tablet Take 2 tablets by mouth 2 (two) times daily.  Marland Kitchen escitalopram (LEXAPRO) 10 MG tablet TAKE 1 TABLET(10 MG) BY MOUTH DAILY  . FARXIGA 10 MG TABS tablet Take 1 tablet by mouth daily.  . fluticasone (FLONASE) 50 MCG/ACT nasal spray Place 2 sprays into both nostrils daily.  . hydrOXYzine (ATARAX/VISTARIL) 25 MG tablet TK 1 TO 2 TS PO QHS  . ibuprofen (ADVIL,MOTRIN) 600 MG tablet Take 1 tablet (600 mg total) by mouth every 6 (six) hours as needed.  Marland Kitchen levothyroxine (SYNTHROID) 175 MCG tablet   . metFORMIN (GLUCOPHAGE-XR) 500  MG 24 hr tablet Take 3 tablets by mouth daily.   . metoprolol succinate (TOPROL-XL) 50 MG 24 hr tablet TAKE 1 TABLET BY MOUTH EVERY DAY  . pantoprazole (PROTONIX) 40 MG tablet Take 1 tablet (40 mg total) by mouth 2 (two) times daily.  Marland Kitchen tiZANidine (ZANAFLEX) 4 MG tablet TAKE 1 TABLET(4 MG) BY MOUTH DAILY AS NEEDED FOR MUSCLE SPASMS  . zaleplon (SONATA) 10 MG capsule TAKE 1 CAPSULE(10 MG) BY MOUTH AT BEDTIME    PHQ 2/9 Scores 03/18/2019 01/07/2019 12/09/2018 03/20/2018  PHQ - 2 Score 0 0 0 0  PHQ- 9 Score 0 - - -    BP Readings from Last 3 Encounters:  03/18/19 (!) 148/82  01/07/19 124/70  12/09/18 (!) 164/86    Physical Exam Vitals and nursing note reviewed.  Constitutional:      General: She is not in acute distress.    Appearance: She is well-developed.  HENT:     Head: Normocephalic and atraumatic.  Pulmonary:     Effort: Pulmonary effort is normal. No respiratory distress.  Abdominal:     General: Abdomen is flat. Bowel sounds are normal.     Palpations: Abdomen is soft.     Tenderness: There is abdominal tenderness in the epigastric area. There is no right CVA tenderness or left CVA tenderness. Positive signs include Murphy's sign.  Musculoskeletal:        General: Normal range of motion.  Skin:    General: Skin is warm and dry.     Findings: No rash.  Neurological:     Mental Status: She is alert and oriented to person, place, and time.  Psychiatric:        Attention and Perception: Attention normal.        Mood and Affect: Mood normal.        Behavior: Behavior normal.        Thought Content: Thought content normal.     Wt Readings from Last 3 Encounters:  03/18/19 215 lb (97.5 kg)  01/07/19 219 lb (99.3 kg)  01/01/19 220 lb 9.6 oz (100.1 kg)    BP (!) 148/82   Pulse 77   Temp (!) 95.5 F (35.3 C) (Temporal)   Ht 5\' 6"  (1.676 m)   Wt 215 lb (97.5 kg)   SpO2 94%   BMI 34.70 kg/m   Assessment and Plan: 1. Epigastric pain Suspect gall bladder  disease Recommend low fat diet - US Abdomen Limited RUQ; Future - Comprehensive metabolic panel  2. Diarrhea, unspecified type Will check liver tests that were mildly elevated last visit and gi pathogens - GI Profile, Stool, PCR - Comprehensive metabolic panel   Partially dictated using Dragon software. Any errors are unintentional.  Halina Maidens, MD [facility]  03/18/2019

## 2019-03-19 LAB — COMPREHENSIVE METABOLIC PANEL
ALT: 31 IU/L (ref 0–32)
AST: 30 IU/L (ref 0–40)
Albumin/Globulin Ratio: 1.8 (ref 1.2–2.2)
Albumin: 4.4 g/dL (ref 3.8–4.9)
Alkaline Phosphatase: 132 IU/L — ABNORMAL HIGH (ref 39–117)
BUN/Creatinine Ratio: 18 (ref 9–23)
BUN: 13 mg/dL (ref 6–24)
Bilirubin Total: 0.4 mg/dL (ref 0.0–1.2)
CO2: 20 mmol/L (ref 20–29)
Calcium: 9.1 mg/dL (ref 8.7–10.2)
Chloride: 104 mmol/L (ref 96–106)
Creatinine, Ser: 0.72 mg/dL (ref 0.57–1.00)
GFR calc Af Amer: 106 mL/min/{1.73_m2} (ref >59)
GFR calc non Af Amer: 92 mL/min/{1.73_m2} (ref >59)
Globulin, Total: 2.5 g/dL (ref 1.5–4.5)
Glucose: 109 mg/dL — ABNORMAL HIGH (ref 65–99)
Potassium: 4.5 mmol/L (ref 3.5–5.2)
Sodium: 142 mmol/L (ref 134–144)
Total Protein: 6.9 g/dL (ref 6.0–8.5)

## 2019-03-23 ENCOUNTER — Ambulatory Visit
Admission: RE | Admit: 2019-03-23 | Discharge: 2019-03-23 | Disposition: A | Discharge status: Home / Self Care | Payer: BC Managed Care – PPO | Source: Ambulatory Visit | Attending: Internal Medicine | Admitting: Internal Medicine

## 2019-03-23 ENCOUNTER — Other Ambulatory Visit: Payer: Self-pay

## 2019-03-23 DIAGNOSIS — K828 Other specified diseases of gallbladder: Secondary | ICD-10-CM

## 2019-03-23 DIAGNOSIS — K760 Fatty (change of) liver, not elsewhere classified: Secondary | ICD-10-CM | POA: Diagnosis not present

## 2019-03-23 DIAGNOSIS — R1013 Epigastric pain: Secondary | ICD-10-CM | POA: Insufficient documentation

## 2019-03-24 DIAGNOSIS — A09 Infectious gastroenteritis and colitis, unspecified: Secondary | ICD-10-CM | POA: Diagnosis not present

## 2019-03-24 DIAGNOSIS — R1013 Epigastric pain: Secondary | ICD-10-CM | POA: Diagnosis not present

## 2019-03-24 DIAGNOSIS — R197 Diarrhea, unspecified: Secondary | ICD-10-CM | POA: Diagnosis not present

## 2019-03-29 LAB — GI PROFILE, STOOL, PCR

## 2019-03-31 DIAGNOSIS — Z7984 Long term (current) use of oral hypoglycemic drugs: Secondary | ICD-10-CM | POA: Diagnosis not present

## 2019-03-31 DIAGNOSIS — E119 Type 2 diabetes mellitus without complications: Secondary | ICD-10-CM | POA: Diagnosis not present

## 2019-03-31 DIAGNOSIS — R197 Diarrhea, unspecified: Secondary | ICD-10-CM | POA: Diagnosis not present

## 2019-03-31 DIAGNOSIS — K838 Other specified diseases of biliary tract: Secondary | ICD-10-CM | POA: Diagnosis not present

## 2019-03-31 DIAGNOSIS — Z79899 Other long term (current) drug therapy: Secondary | ICD-10-CM | POA: Diagnosis not present

## 2019-03-31 DIAGNOSIS — R1013 Epigastric pain: Secondary | ICD-10-CM | POA: Diagnosis not present

## 2019-03-31 DIAGNOSIS — R1011 Right upper quadrant pain: Secondary | ICD-10-CM | POA: Diagnosis not present

## 2019-03-31 DIAGNOSIS — K219 Gastro-esophageal reflux disease without esophagitis: Secondary | ICD-10-CM | POA: Diagnosis not present

## 2019-03-31 DIAGNOSIS — R110 Nausea: Secondary | ICD-10-CM | POA: Diagnosis not present

## 2019-04-01 DIAGNOSIS — Z6835 Body mass index (BMI) 35.0-35.9, adult: Secondary | ICD-10-CM | POA: Diagnosis not present

## 2019-04-01 DIAGNOSIS — R112 Nausea with vomiting, unspecified: Secondary | ICD-10-CM | POA: Diagnosis not present

## 2019-04-01 DIAGNOSIS — R1011 Right upper quadrant pain: Secondary | ICD-10-CM | POA: Diagnosis not present

## 2019-04-01 DIAGNOSIS — R197 Diarrhea, unspecified: Secondary | ICD-10-CM | POA: Diagnosis not present

## 2019-04-07 DIAGNOSIS — R112 Nausea with vomiting, unspecified: Secondary | ICD-10-CM | POA: Diagnosis not present

## 2019-04-07 DIAGNOSIS — R1011 Right upper quadrant pain: Secondary | ICD-10-CM | POA: Diagnosis not present

## 2019-04-07 DIAGNOSIS — R197 Diarrhea, unspecified: Secondary | ICD-10-CM | POA: Diagnosis not present

## 2019-04-08 DIAGNOSIS — C73 Malignant neoplasm of thyroid gland: Secondary | ICD-10-CM | POA: Diagnosis not present

## 2019-04-08 DIAGNOSIS — E890 Postprocedural hypothyroidism: Secondary | ICD-10-CM | POA: Diagnosis not present

## 2019-04-15 DIAGNOSIS — E890 Postprocedural hypothyroidism: Secondary | ICD-10-CM | POA: Diagnosis not present

## 2019-04-15 DIAGNOSIS — E119 Type 2 diabetes mellitus without complications: Secondary | ICD-10-CM | POA: Diagnosis not present

## 2019-04-15 DIAGNOSIS — C73 Malignant neoplasm of thyroid gland: Secondary | ICD-10-CM | POA: Diagnosis not present

## 2019-05-02 DIAGNOSIS — Z20822 Contact with and (suspected) exposure to covid-19: Secondary | ICD-10-CM | POA: Diagnosis not present

## 2019-05-02 DIAGNOSIS — Z01812 Encounter for preprocedural laboratory examination: Secondary | ICD-10-CM | POA: Diagnosis not present

## 2019-05-05 DIAGNOSIS — E119 Type 2 diabetes mellitus without complications: Secondary | ICD-10-CM | POA: Diagnosis not present

## 2019-05-05 DIAGNOSIS — K828 Other specified diseases of gallbladder: Secondary | ICD-10-CM | POA: Diagnosis not present

## 2019-05-05 DIAGNOSIS — Z85850 Personal history of malignant neoplasm of thyroid: Secondary | ICD-10-CM | POA: Diagnosis not present

## 2019-05-05 DIAGNOSIS — K660 Peritoneal adhesions (postprocedural) (postinfection): Secondary | ICD-10-CM | POA: Diagnosis not present

## 2019-05-05 DIAGNOSIS — Z8249 Family history of ischemic heart disease and other diseases of the circulatory system: Secondary | ICD-10-CM | POA: Diagnosis not present

## 2019-05-05 DIAGNOSIS — E669 Obesity, unspecified: Secondary | ICD-10-CM | POA: Diagnosis not present

## 2019-05-05 DIAGNOSIS — Z79899 Other long term (current) drug therapy: Secondary | ICD-10-CM | POA: Diagnosis not present

## 2019-05-05 DIAGNOSIS — K824 Cholesterolosis of gallbladder: Secondary | ICD-10-CM | POA: Diagnosis not present

## 2019-05-05 DIAGNOSIS — K219 Gastro-esophageal reflux disease without esophagitis: Secondary | ICD-10-CM | POA: Diagnosis not present

## 2019-05-05 DIAGNOSIS — F419 Anxiety disorder, unspecified: Secondary | ICD-10-CM | POA: Diagnosis not present

## 2019-05-05 DIAGNOSIS — F329 Major depressive disorder, single episode, unspecified: Secondary | ICD-10-CM | POA: Diagnosis not present

## 2019-05-05 DIAGNOSIS — I10 Essential (primary) hypertension: Secondary | ICD-10-CM | POA: Diagnosis not present

## 2019-05-05 DIAGNOSIS — G4700 Insomnia, unspecified: Secondary | ICD-10-CM | POA: Diagnosis not present

## 2019-05-05 DIAGNOSIS — Z7984 Long term (current) use of oral hypoglycemic drugs: Secondary | ICD-10-CM | POA: Diagnosis not present

## 2019-05-05 DIAGNOSIS — Z8711 Personal history of peptic ulcer disease: Secondary | ICD-10-CM | POA: Diagnosis not present

## 2019-05-05 DIAGNOSIS — Z6835 Body mass index (BMI) 35.0-35.9, adult: Secondary | ICD-10-CM | POA: Diagnosis not present

## 2019-05-05 DIAGNOSIS — Z885 Allergy status to narcotic agent status: Secondary | ICD-10-CM | POA: Diagnosis not present

## 2019-05-05 HISTORY — PX: CHOLECYSTECTOMY: SHX55

## 2019-05-06 ENCOUNTER — Encounter: Payer: Self-pay | Admitting: Internal Medicine

## 2019-05-11 ENCOUNTER — Other Ambulatory Visit: Payer: Self-pay | Admitting: Internal Medicine

## 2019-05-11 DIAGNOSIS — F39 Unspecified mood [affective] disorder: Secondary | ICD-10-CM

## 2019-05-25 ENCOUNTER — Telehealth: Payer: Self-pay | Admitting: Internal Medicine

## 2019-05-25 NOTE — Telephone Encounter (Signed)
Called and spoke with patient. She said that she feels ever since she has been diagnosed with Diabetes in October of 2020 her moods have been changing. She feels more anxious.  Spoke with Dr Army Melia. Told patient to increase Lexapro to 20 mg daily. Told her it can take several week to feel the effect. If she needs a refill on 20 mg to let us know and we will send this in. If she does not feel better after several weeks of trying this, told her to call and schedule an appt to discuss other options.  CM

## 2019-05-25 NOTE — Telephone Encounter (Signed)
Copied from [place] [id number]. Topic: General - Other >> May 25, 2019  9:46 AM Antonieta Iba C wrote: Reason for CRM: pt is currently taking Lexapro. Pt says that she has noticing a change in her mood and would like to discuss further.   Pt is requesting a call back to be advised    [phone]

## 2019-06-03 ENCOUNTER — Other Ambulatory Visit: Payer: Self-pay | Admitting: Internal Medicine

## 2019-06-03 ENCOUNTER — Telehealth: Payer: Self-pay | Admitting: Internal Medicine

## 2019-06-03 DIAGNOSIS — M542 Cervicalgia: Secondary | ICD-10-CM

## 2019-06-03 NOTE — Telephone Encounter (Signed)
Medication Refill - Medication: escitalopram (LEXAPRO) 20 MG   Has the patient contacted their pharmacy? No. (Agent: If no, request that the patient contact the pharmacy for the refill.) (Agent: If yes, when and what did the pharmacy advise?)  Preferred Pharmacy (with phone number or street name):  [facility] [id number] [place] [address] AT [address] Phone:  [phone]  Fax:  [phone]       Agent: Please be advised that RX refills may take up to 3 business days. We ask that you follow-up with your pharmacy.

## 2019-06-04 ENCOUNTER — Other Ambulatory Visit: Payer: Self-pay

## 2019-06-04 DIAGNOSIS — F39 Unspecified mood [affective] disorder: Secondary | ICD-10-CM

## 2019-06-04 MED ORDER — ESCITALOPRAM OXALATE 20 MG PO TABS: ORAL_TABLET | ORAL | 5 refills | Status: DC

## 2019-06-04 NOTE — Telephone Encounter (Signed)
Sent in 20 mg dose of lexapro. Informed pt.   CM

## 2019-06-24 ENCOUNTER — Ambulatory Visit: Payer: BC Managed Care – PPO | Admitting: Internal Medicine

## 2019-07-04 ENCOUNTER — Other Ambulatory Visit: Payer: Self-pay | Admitting: Internal Medicine

## 2019-07-07 ENCOUNTER — Other Ambulatory Visit: Payer: Self-pay | Admitting: Internal Medicine

## 2019-07-07 DIAGNOSIS — E785 Hyperlipidemia, unspecified: Secondary | ICD-10-CM

## 2019-07-07 DIAGNOSIS — F5101 Primary insomnia: Secondary | ICD-10-CM

## 2019-07-07 MED ORDER — ZALEPLON 10 MG PO CAPS: 10.0000 mg | ORAL_CAPSULE | Freq: Every evening | ORAL | 0 refills | Status: DC | PRN

## 2019-07-07 MED ORDER — ATORVASTATIN CALCIUM 10 MG PO TABS: 10.0000 mg | ORAL_TABLET | Freq: Every day | ORAL | 3 refills | Status: DC

## 2019-07-07 NOTE — Telephone Encounter (Signed)
Requested medication (s) are due for refill today: yes  Requested medication (s) are on the active medication list: yes  Last refill:  01/06/19  Future visit scheduled: no  Notes to clinic:  not delegated    Requested Prescriptions  Pending Prescriptions Disp Refills   zaleplon (SONATA) 10 MG capsule 30 capsule 5      Not Delegated - Psychiatry:  Anxiolytics/Hypnotics Failed - 07/07/2019  9:14 AM      Failed - This refill cannot be delegated      Failed - Urine Drug Screen completed in last 360 days.      Passed - Valid encounter within last 6 months    Recent Outpatient Visits           3 months ago Epigastric pain   [facility] Glean Hess, MD   6 months ago Annual physical exam   [facility] Glean Hess, MD   1 year ago Coccyx pain   [facility] Glean Hess, MD   1 year ago Neck pain on left side   [facility] Glean Hess, MD   1 year ago Neck pain on left side   [facility] Glean Hess, MD               Signed Prescriptions Disp Refills   atorvastatin (LIPITOR) 10 MG tablet 90 tablet 3    Sig: Take 1 tablet (10 mg total) by mouth daily.      Cardiovascular:  Antilipid - Statins Failed - 07/07/2019  9:14 AM      Failed - LDL in normal range and within 360 days    LDL Chol Calc (NIH)  Date Value Ref Range Status  01/07/2019 126 (H) 0 - 99 mg/dL Final          Failed - Triglycerides in normal range and within 360 days    Triglycerides  Date Value Ref Range Status  01/07/2019 150 (H) 0 - 149 mg/dL Final          Passed - Total Cholesterol in normal range and within 360 days    Cholesterol, Total  Date Value Ref Range Status  01/07/2019 196 100 - 199 mg/dL Final          Passed - HDL in normal range and within 360 days    HDL  Date Value Ref Range Status  01/07/2019 43 >39 mg/dL Final          Passed - Patient is not pregnant      Passed - Valid encounter within  last 12 months    Recent Outpatient Visits           3 months ago Epigastric pain   [facility] Glean Hess, MD   6 months ago Annual physical exam   [facility] Glean Hess, MD   1 year ago Coccyx pain   [facility] Glean Hess, MD   1 year ago Neck pain on left side   [facility] Glean Hess, MD   1 year ago Neck pain on left side   [facility] Glean Hess, MD

## 2019-07-07 NOTE — Telephone Encounter (Signed)
Please Advise. Last office visit was 02/2019. Cancelled 06/24/19 appt.  KP

## 2019-07-07 NOTE — Telephone Encounter (Addendum)
Pt has a new pharm and needs new rx's sent to [facility] in [place] on [address]. Pt would like 90 day supply sonata 10 mg and atorvastatin 10 mg. Pt last appt feb 2021. Pt does not have upcoming appt

## 2019-07-07 NOTE — Telephone Encounter (Signed)
Call pt.  She needs to be seen for cholesterol and DM - she cancelled last appt.

## 2019-07-07 NOTE — Telephone Encounter (Signed)
Requested Prescriptions  Pending Prescriptions Disp Refills   atorvastatin (LIPITOR) 10 MG tablet 90 tablet 3    Sig: Take 1 tablet (10 mg total) by mouth daily.     Cardiovascular:  Antilipid - Statins Failed - 07/07/2019  9:14 AM      Failed - LDL in normal range and within 360 days    LDL Chol Calc (NIH)  Date Value Ref Range Status  01/07/2019 126 (H) 0 - 99 mg/dL Final         Failed - Triglycerides in normal range and within 360 days    Triglycerides  Date Value Ref Range Status  01/07/2019 150 (H) 0 - 149 mg/dL Final         Passed - Total Cholesterol in normal range and within 360 days    Cholesterol, Total  Date Value Ref Range Status  01/07/2019 196 100 - 199 mg/dL Final         Passed - HDL in normal range and within 360 days    HDL  Date Value Ref Range Status  01/07/2019 43 >39 mg/dL Final         Passed - Patient is not pregnant      Passed - Valid encounter within last 12 months    Recent Outpatient Visits          3 months ago Epigastric pain   [facility] Glean Hess, MD   6 months ago Annual physical exam   [facility] Glean Hess, MD   1 year ago Coccyx pain   [facility] Glean Hess, MD   1 year ago Neck pain on left side   [facility] Glean Hess, MD   1 year ago Neck pain on left side   [facility] Glean Hess, MD              zaleplon (SONATA) 10 MG capsule 30 capsule 5     Not Delegated - Psychiatry:  Anxiolytics/Hypnotics Failed - 07/07/2019  9:14 AM      Failed - This refill cannot be delegated      Failed - Urine Drug Screen completed in last 360 days.      Passed - Valid encounter within last 6 months    Recent Outpatient Visits          3 months ago Epigastric pain   [facility] Glean Hess, MD   6 months ago Annual physical exam   [facility] Glean Hess, MD   1 year ago Coccyx pain   [facility]  Glean Hess, MD   1 year ago Neck pain on left side   [facility] Glean Hess, MD   1 year ago Neck pain on left side   [facility] Glean Hess, MD

## 2019-07-09 ENCOUNTER — Encounter: Payer: Self-pay | Admitting: Internal Medicine

## 2019-07-09 ENCOUNTER — Ambulatory Visit (INDEPENDENT_AMBULATORY_CARE_PROVIDER_SITE_OTHER): Payer: BC Managed Care – PPO | Admitting: Internal Medicine

## 2019-07-09 ENCOUNTER — Other Ambulatory Visit: Payer: Self-pay

## 2019-07-09 VITALS — BP 118/64 | HR 80 | Temp 99.5°F | Ht 66.0 in | Wt 219.0 lb

## 2019-07-09 DIAGNOSIS — R000 Tachycardia, unspecified: Secondary | ICD-10-CM | POA: Diagnosis not present

## 2019-07-09 DIAGNOSIS — E785 Hyperlipidemia, unspecified: Secondary | ICD-10-CM | POA: Diagnosis not present

## 2019-07-09 DIAGNOSIS — E118 Type 2 diabetes mellitus with unspecified complications: Secondary | ICD-10-CM | POA: Diagnosis not present

## 2019-07-09 DIAGNOSIS — F39 Unspecified mood [affective] disorder: Secondary | ICD-10-CM

## 2019-07-09 DIAGNOSIS — E890 Postprocedural hypothyroidism: Secondary | ICD-10-CM

## 2019-07-09 MED ORDER — METOPROLOL SUCCINATE ER 50 MG PO TB24: 50.0000 mg | ORAL_TABLET | Freq: Every day | ORAL | 3 refills | Status: DC

## 2019-07-09 NOTE — Progress Notes (Signed)
Date:  07/09/2019   Name:  Gabrielle Tucker   DOB:  1960/07/12   MRN:  [id number]   Chief Complaint: Hyperlipidemia (follow up )  Diabetes She presents for her follow-up (saw Dr. Gabriel Carina ) diabetic visit. She has type 2 diabetes mellitus. Her disease course has been improving. Pertinent negatives for hypoglycemia include no headaches or tremors. Pertinent negatives for diabetes include no blurred vision, no chest pain, no fatigue, no foot paresthesias, no foot ulcerations, no polyuria and no weight loss. Symptoms are stable. Current diabetic treatment includes oral agent (monotherapy) (metformin, could not take farxiga due to yeast inf). She is compliant with treatment all of the time. An ACE inhibitor/angiotensin II receptor blocker is not being taken (microalb normal). Eye exam is current.  Hyperlipidemia This is a chronic problem. Pertinent negatives include no chest pain or shortness of breath. Current antihyperlipidemic treatment includes statins (restarted lipitor).  Insomnia Primary symptoms: sleep disturbance, difficulty falling asleep.  The problem occurs nightly. Past treatments include medication. The treatment provided significant relief.    Lab Results  Component Value Date   CREATININE 0.72 03/18/2019   BUN 13 03/18/2019   NA 142 03/18/2019   K 4.5 03/18/2019   CL 104 03/18/2019   CO2 20 03/18/2019   Lab Results  Component Value Date   CHOL 196 01/07/2019   HDL 43 01/07/2019   LDLCALC 126 (H) 01/07/2019   TRIG 150 (H) 01/07/2019   CHOLHDL 4.6 (H) 01/07/2019   Lab Results  Component Value Date   TSH 0.95 10/07/2018   Lab Results  Component Value Date   HGBA1C 7.4 (H) 01/07/2019   Lab Results  Component Value Date   WBC 7.8 01/07/2019   HGB 13.8 01/07/2019   HCT 42.2 01/07/2019   MCV 92 01/07/2019   PLT 186 01/07/2019   Lab Results  Component Value Date   ALT 31 03/18/2019   AST 30 03/18/2019   ALKPHOS 132 (H) 03/18/2019   BILITOT 0.4 03/18/2019      Review of Systems  Constitutional: Negative for appetite change, fatigue, unexpected weight change and weight loss.  HENT: Negative for tinnitus and trouble swallowing.   Eyes: Negative for blurred vision and visual disturbance.  Respiratory: Negative for cough, chest tightness and shortness of breath.   Cardiovascular: Negative for chest pain, palpitations and leg swelling.  Gastrointestinal: Negative for abdominal pain.  Endocrine: Negative for polyuria.  Genitourinary: Negative for dysuria and hematuria.  Musculoskeletal: Negative for arthralgias.  Neurological: Negative for tremors, numbness and headaches.  Psychiatric/Behavioral: Positive for sleep disturbance. Negative for dysphoric mood. The patient has insomnia.     Patient Active Problem List   Diagnosis Date Noted  . Type II diabetes mellitus with complication ([place]) 17/79/3903  . Mood disorder ([place]) 01/07/2019  . S/P total thyroidectomy 12/16/2017  . 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 Multiple thyroid nodules 11/12/2017  . Hyperlipidemia, mild 08/03/2016  . Arthritis of knee, left 08/02/2016  . Back muscle spasm 07/03/2014  . H/O peptic ulcer 07/03/2014  . Migraine without aura and responsive to treatment 07/03/2014  . Muscle contraction headache 07/03/2014  . Idiopathic insomnia 07/03/2014  . Tachycardia 07/03/2014    Allergies  Allergen Reactions  . Morphine And Related Nausea And Vomiting  . Tape Hives and Rash    No [paper tape.  Tega derm OK    Past Surgical History:  Procedure Laterality Date  . ABDOMINAL HYSTERECTOMY    . ANTERIOR FUSION  CERVICAL SPINE  2006  . BREAST REDUCTION SURGERY    . CHOLECYSTECTOMY  05/05/2019  . ESOPHAGOGASTRODUODENOSCOPY  2011   gastric ulcer  . LITHOTRIPSY  2012   with setnet  . LUMBAR DISC SURGERY    . NASAL SINUS SURGERY  2015  . partail knee replacement Bilateral   . REDUCTION MAMMAPLASTY Bilateral 2011  . THYROIDECTOMY Bilateral 12/16/2017    Procedure: THYROIDECTOMY;  Surgeon: Margaretha Sheffield, MD;  Location: [facility] ORS;  Service: ENT;  Laterality: Bilateral;    Social History   Tobacco Use  . Smoking status: Never Smoker  . Smokeless tobacco: Never Used  Vaping Use  . Vaping Use: Never used  Substance Use Topics  . Alcohol use: Yes    Alcohol/week: 0.0 - 1.0 standard drinks    Comment: on occassion  . Drug use: Not Currently    Types: Marijuana     Medication list has been reviewed and updated.  Current Meds  Medication Sig  . atorvastatin (LIPITOR) 10 MG tablet Take 1 tablet (10 mg total) by mouth daily.  Marland Kitchen escitalopram (LEXAPRO) 20 MG tablet TAKE 1 TABLET(10 MG) BY MOUTH DAILY  . fluticasone (FLONASE) 50 MCG/ACT nasal spray Place 2 sprays into both nostrils daily. (Patient taking differently: Place 2 sprays into both nostrils daily. PRN)  . hydrOXYzine (ATARAX/VISTARIL) 25 MG tablet TK 1 TO 2 TS PO QHS  . levothyroxine (SYNTHROID) 175 MCG tablet   . metFORMIN (GLUCOPHAGE-XR) 500 MG 24 hr tablet Take 3 tablets by mouth daily.   . metoprolol succinate (TOPROL-XL) 50 MG 24 hr tablet TAKE 1 TABLET BY MOUTH EVERY DAY  . pantoprazole (PROTONIX) 40 MG tablet TAKE 1 TABLET BY MOUTH TWICE DAILY  . tiZANidine (ZANAFLEX) 4 MG tablet TAKE 1 TABLET(4 MG) BY MOUTH DAILY AS NEEDED FOR MUSCLE SPASMS  . zaleplon (SONATA) 10 MG capsule Take 1 capsule (10 mg total) by mouth at bedtime as needed for sleep.    PHQ 2/9 Scores 07/09/2019 03/18/2019 01/07/2019 12/09/2018  PHQ - 2 Score 0 0 0 0  PHQ- 9 Score 0 0 - -    GAD 7 : Generalized Anxiety Score 07/09/2019  Nervous, Anxious, on Edge 0  Control/stop worrying 0  Worry too much - different things 0  Trouble relaxing 0  Restless 0  Easily annoyed or irritable 0  Afraid - awful might happen 0  Total GAD 7 Score 0  Anxiety Difficulty Not difficult at all    BP Readings from Last 3 Encounters:  07/09/19 118/64  03/18/19 (!) 148/82  01/07/19 124/70    Physical Exam Vitals  and nursing note reviewed.  Constitutional:      General: She is not in acute distress.    Appearance: Normal appearance. She is well-developed.  HENT:     Head: Normocephalic and atraumatic.  Cardiovascular:     Rate and Rhythm: Normal rate and regular rhythm.     Pulses: Normal pulses.  Pulmonary:     Effort: Pulmonary effort is normal. No respiratory distress.     Breath sounds: No wheezing or rhonchi.  Musculoskeletal:     Cervical back: Normal range of motion.     Right lower leg: No edema.     Left lower leg: No edema.  Lymphadenopathy:     Cervical: No cervical adenopathy.  Skin:    General: Skin is warm and dry.     Capillary Refill: Capillary refill takes less than 2 seconds.     Findings: No rash.  Neurological:     General: No focal deficit present.     Mental Status: She is alert and oriented to person, place, and time.  Psychiatric:        Mood and Affect: Mood normal.     Wt Readings from Last 3 Encounters:  07/09/19 219 lb (99.3 kg)  03/18/19 215 lb (97.5 kg)  01/07/19 219 lb (99.3 kg)    BP 118/64   Pulse 80   Temp 99.5 F (37.5 C) (Oral)   Ht 5\' 6"  (1.676 m)   Wt 219 lb (99.3 kg)   SpO2 97%   BMI 35.35 kg/m   Assessment and Plan: 1. Hyperlipidemia, mild Now back on statin therapy and tolerating well since gall bladder surgery in April Check labs; goal LDL < 70 - Lipid panel - Hepatic function panel  2. Type II diabetes mellitus with complication (HCC) Clinically stable by exam and report without s/s of hypoglycemia. DM complicated by lipids. Tolerating medications well without side effects or other concerns. Followed by endocrinology  3. Tachycardia HR controlled on daily metoprolol - metoprolol succinate (TOPROL-XL) 50 MG 24 hr tablet; Take 1 tablet (50 mg total) by mouth daily. Take with or immediately following a meal.  Dispense: 90 tablet; Refill: 3  4. Mood disorder (HCC) Stable without SI/HI Continue Celexa  5. S/P total  thyroidectomy Followed by endocrinology On Levothyroxine   Partially dictated using Editor, commissioning. Any errors are unintentional.  Halina Maidens, MD [facility]  07/09/2019

## 2019-07-10 LAB — HEPATIC FUNCTION PANEL
ALT: 30 IU/L (ref 0–32)
AST: 34 IU/L (ref 0–40)
Albumin: 4.4 g/dL (ref 3.8–4.9)
Alkaline Phosphatase: 153 IU/L — ABNORMAL HIGH (ref 48–121)
Bilirubin Total: 0.7 mg/dL (ref 0.0–1.2)
Bilirubin, Direct: 0.18 mg/dL (ref 0.00–0.40)
Total Protein: 7 g/dL (ref 6.0–8.5)

## 2019-07-10 LAB — LIPID PANEL
Chol/HDL Ratio: 3.7 ratio (ref 0.0–4.4)
Cholesterol, Total: 152 mg/dL (ref 100–199)
HDL: 41 mg/dL (ref >39)
LDL Chol Calc (NIH): 81 mg/dL (ref 0–99)
Triglycerides: 174 mg/dL — ABNORMAL HIGH (ref 0–149)
VLDL Cholesterol Cal: 30 mg/dL (ref 5–40)

## 2019-07-15 DIAGNOSIS — E119 Type 2 diabetes mellitus without complications: Secondary | ICD-10-CM | POA: Diagnosis not present

## 2019-07-15 DIAGNOSIS — E890 Postprocedural hypothyroidism: Secondary | ICD-10-CM | POA: Diagnosis not present

## 2019-07-15 DIAGNOSIS — C73 Malignant neoplasm of thyroid gland: Secondary | ICD-10-CM | POA: Diagnosis not present

## 2019-07-18 ENCOUNTER — Emergency Department
Admission: EM | Admit: 2019-07-18 | Discharge: 2019-07-18 | Disposition: A | Discharge status: Home / Self Care | Payer: BC Managed Care – PPO | Attending: Emergency Medicine | Admitting: Emergency Medicine

## 2019-07-18 ENCOUNTER — Encounter: Payer: Self-pay | Admitting: Intensive Care

## 2019-07-18 ENCOUNTER — Emergency Department: Payer: BC Managed Care – PPO

## 2019-07-18 ENCOUNTER — Other Ambulatory Visit: Payer: Self-pay

## 2019-07-18 DIAGNOSIS — Z886 Allergy status to analgesic agent status: Secondary | ICD-10-CM | POA: Diagnosis not present

## 2019-07-18 DIAGNOSIS — Z23 Encounter for immunization: Secondary | ICD-10-CM | POA: Insufficient documentation

## 2019-07-18 DIAGNOSIS — M25572 Pain in left ankle and joints of left foot: Secondary | ICD-10-CM | POA: Insufficient documentation

## 2019-07-18 DIAGNOSIS — E785 Hyperlipidemia, unspecified: Secondary | ICD-10-CM | POA: Insufficient documentation

## 2019-07-18 DIAGNOSIS — Y939 Activity, unspecified: Secondary | ICD-10-CM | POA: Insufficient documentation

## 2019-07-18 DIAGNOSIS — W19XXXA Unspecified fall, initial encounter: Secondary | ICD-10-CM | POA: Insufficient documentation

## 2019-07-18 DIAGNOSIS — S80811A Abrasion, right lower leg, initial encounter: Secondary | ICD-10-CM | POA: Insufficient documentation

## 2019-07-18 DIAGNOSIS — Z7984 Long term (current) use of oral hypoglycemic drugs: Secondary | ICD-10-CM | POA: Diagnosis not present

## 2019-07-18 DIAGNOSIS — E118 Type 2 diabetes mellitus with unspecified complications: Secondary | ICD-10-CM | POA: Diagnosis not present

## 2019-07-18 DIAGNOSIS — C73 Malignant neoplasm of thyroid gland: Secondary | ICD-10-CM | POA: Diagnosis not present

## 2019-07-18 DIAGNOSIS — Y999 Unspecified external cause status: Secondary | ICD-10-CM | POA: Insufficient documentation

## 2019-07-18 DIAGNOSIS — S99912A Unspecified injury of left ankle, initial encounter: Secondary | ICD-10-CM

## 2019-07-18 DIAGNOSIS — S90512A Abrasion, left ankle, initial encounter: Secondary | ICD-10-CM | POA: Diagnosis not present

## 2019-07-18 DIAGNOSIS — Y929 Unspecified place or not applicable: Secondary | ICD-10-CM | POA: Insufficient documentation

## 2019-07-18 DIAGNOSIS — Z79899 Other long term (current) drug therapy: Secondary | ICD-10-CM | POA: Diagnosis not present

## 2019-07-18 DIAGNOSIS — T300 Burn of unspecified body region, unspecified degree: Secondary | ICD-10-CM

## 2019-07-18 DIAGNOSIS — T25022A Burn of unspecified degree of left foot, initial encounter: Secondary | ICD-10-CM | POA: Diagnosis not present

## 2019-07-18 MED ORDER — MELOXICAM 7.5 MG PO TABS
7.5000 mg | ORAL_TABLET | Freq: Every day | ORAL | 0 refills | Status: AC
Start: 2019-07-18 — End: 2019-07-25

## 2019-07-18 MED ORDER — BACITRACIN-NEOMYCIN-POLYMYXIN OINTMENT TUBE
TOPICAL_OINTMENT | Freq: Once | CUTANEOUS | Status: AC
  Filled 2019-07-18: qty 14.17

## 2019-07-18 MED ORDER — OXYCODONE-ACETAMINOPHEN 5-325 MG PO TABS
1.0000 | ORAL_TABLET | Freq: Once | ORAL | Status: AC
  Administered 2019-07-18: 1 via ORAL
  Filled 2019-07-18: qty 1

## 2019-07-18 MED ORDER — TETANUS-DIPHTH-ACELL PERTUSSIS 5-2.5-18.5 LF-MCG/0.5 IM SUSP
0.5000 mL | Freq: Once | INTRAMUSCULAR | Status: AC
  Administered 2019-07-18: 0.5 mL via INTRAMUSCULAR
  Filled 2019-07-18: qty 0.5

## 2019-07-18 NOTE — ED Triage Notes (Signed)
Patient presents with left ankle pain and skinned right shin from mechanical fall. Unable to bear weight on left ankle

## 2019-07-18 NOTE — ED Provider Notes (Signed)
[facility] Emergency Department Provider Note  ____________________________________________  Time seen: Approximately 4:45 PM  I have reviewed the triage vital signs and the nursing notes.   HISTORY  Chief Complaint Ankle Pain (left)    HPI Gabrielle Tucker is a 59 y.o. female that presents to the emergency department for evaluation of abrasion to right shin and left ankle pain after a fall today.  Patient states that a step broke, causing her to fall.  She landed on gravel.  Her left ankle twisted inward.  She has had difficulty bearing weight on left ankle since fall.  She had some tingling to her left foot earlier but this has resolved.  She did not hit her head or lose consciousness.  She is not on any blood thinners.   Past Medical History:  Diagnosis Date  . Depression   . Dysrhythmia    tachycardia  . Gastric ulcer   . 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 Hypertension   . Personal history of radiation therapy    had iodine rad therapy    Patient Active Problem List   Diagnosis Date Noted  . Type II diabetes mellitus with complication ([place]) 16/07/3708  . Mood disorder ([place]) 01/07/2019  . S/P total thyroidectomy 12/16/2017  . Multiple thyroid nodules 11/12/2017  . Hyperlipidemia, mild 08/03/2016  . Arthritis of knee, left 08/02/2016  . Back muscle spasm 07/03/2014  . H/O peptic ulcer 07/03/2014  . Migraine without aura and responsive to treatment 07/03/2014  . Muscle contraction headache 07/03/2014  . Idiopathic insomnia 07/03/2014  . Tachycardia 07/03/2014    Past Surgical History:  Procedure Laterality Date  . ABDOMINAL HYSTERECTOMY    . ANTERIOR FUSION CERVICAL SPINE  2006  . BREAST REDUCTION SURGERY    . CHOLECYSTECTOMY  05/05/2019  . ESOPHAGOGASTRODUODENOSCOPY  2011   gastric ulcer  . LITHOTRIPSY  2012   with setnet  . LUMBAR DISC SURGERY    . NASAL SINUS SURGERY  2015  . partail knee replacement Bilateral    . REDUCTION MAMMAPLASTY Bilateral 2011  . THYROIDECTOMY Bilateral 12/16/2017   Procedure: THYROIDECTOMY;  Surgeon: Margaretha Sheffield, MD;  Location: [facility] ORS;  Service: ENT;  Laterality: Bilateral;    Prior to Admission medications   Medication Sig Start Date End Date Taking? Authorizing Provider  atorvastatin (LIPITOR) 10 MG tablet Take 1 tablet (10 mg total) by mouth daily. 07/07/19   Glean Hess, MD  calcium-vitamin D (OSCAL WITH D) 500-200 MG-UNIT tablet Take 2 tablets by mouth 2 (two) times daily. Patient not taking: Reported on 07/09/2019    [provider]  escitalopram (LEXAPRO) 20 MG tablet TAKE 1 TABLET(10 MG) BY MOUTH DAILY 06/04/19   Glean Hess, MD  fluticasone [facility]) 50 MCG/ACT nasal spray Place 2 sprays into both nostrils daily. Patient taking differently: Place 2 sprays into both nostrils daily. PRN 02/02/16   Glean Hess, MD  hydrOXYzine (ATARAX/VISTARIL) 25 MG tablet TK 1 TO 2 TS PO QHS 12/24/17   [provider]  ibuprofen (ADVIL,MOTRIN) 600 MG tablet Take 1 tablet (600 mg total) by mouth every 6 (six) hours as needed. Patient not taking: Reported on 07/09/2019 03/15/18   Melynda Ripple, MD  levothyroxine (SYNTHROID) 175 MCG tablet  04/06/18   [provider]  meloxicam (MOBIC) 7.5 MG tablet Take 1 tablet (7.5 mg total) by mouth daily for 7 days. 07/18/19 07/25/19  Laban Emperor, PA-C  metFORMIN (GLUCOPHAGE-XR) 500 MG 24 hr tablet  Take 3 tablets by mouth daily.  12/04/18   [provider]  metoprolol succinate (TOPROL-XL) 50 MG 24 hr tablet Take 1 tablet (50 mg total) by mouth daily. Take with or immediately following a meal. 07/09/19   Glean Hess, MD  pantoprazole (PROTONIX) 40 MG tablet TAKE 1 TABLET BY MOUTH TWICE DAILY 03/18/19   Glean Hess, MD  tiZANidine (ZANAFLEX) 4 MG tablet TAKE 1 TABLET(4 MG) BY MOUTH DAILY AS NEEDED FOR MUSCLE SPASMS 06/03/19   Glean Hess, MD  zaleplon (SONATA) 10 MG capsule Take 1  capsule (10 mg total) by mouth at bedtime as needed for sleep. 07/07/19   Glean Hess, MD    Allergies Morphine and related and Tape  Family History  Problem Relation Age of Onset  . Stomach cancer Father   . Diabetes Brother   . Breast cancer Neg Hx     Social History Social History   Tobacco Use  . Smoking status: Never Smoker  . Smokeless tobacco: Never Used  Vaping Use  . Vaping Use: Never used  Substance Use Topics  . Alcohol use: Yes    Alcohol/week: 0.0 - 1.0 standard drinks    Comment: on occassion  . Drug use: Not Currently    Types: Marijuana     Review of Systems  Respiratory: No SOB. Gastrointestinal: No abdominal pain.  No nausea, no vomiting.  Musculoskeletal: Positive for ankle pain. Skin: Negative for rash, lacerations, ecchymosis.  Positive for abrasion. Neurological: Negative for headaches, numbness or tingling   ____________________________________________   PHYSICAL EXAM:  VITAL SIGNS: ED Triage Vitals  Enc Vitals Group     BP 07/18/19 1554 (!) 152/77     Pulse Rate 07/18/19 1554 89     Resp 07/18/19 1554 18     Temp 07/18/19 1554 97.9 F (36.6 C)     Temp Source 07/18/19 1554 Oral     SpO2 07/18/19 1554 97 %     Weight 07/18/19 1559 219 lb (99.3 kg)     Height 07/18/19 1559 5\' 6"  (1.676 m)     Head Circumference --      Peak Flow --      Pain Score 07/18/19 1558 5     Pain Loc --      Pain Edu? --      Excl. in [place]? --      Constitutional: Alert and oriented. Well appearing and in no acute distress. Eyes: Conjunctivae are normal. PERRL. EOMI. Head: Atraumatic. ENT:      Ears:      Nose: No congestion/rhinnorhea.      Mouth/Throat: Mucous membranes are moist.  Neck: No stridor.  No cervical spine tenderness to palpation. Cardiovascular: Normal rate, regular rhythm.  Good peripheral circulation.  Symmetric pedal pulses bilaterally. Respiratory: Normal respiratory effort without tachypnea or retractions. Lungs CTAB. Good  air entry to the bases with no decreased or absent breath sounds. Gastrointestinal: Bowel sounds 4 quadrants. Soft and nontender to palpation. No guarding or rigidity. No palpable masses. No distention.  Musculoskeletal: Full range of motion to all extremities. No gross deformities appreciated.  Mild tenderness to palpation to left lateral malleolus and left anterior ankle.  Full range of motion of left ankle but with pain. Neurologic:  Normal speech and language. No gross focal neurologic deficits are appreciated.  Skin:  Skin is warm, dry.  Large abrasion to right shin.  Small nongaping shallow laceration to right proximal shin Psychiatric: Mood and affect  are normal. Speech and behavior are normal. Patient exhibits appropriate insight and judgement.   ____________________________________________   LABS (all labs ordered are listed, but only abnormal results are displayed)  Labs Reviewed - No data to display ____________________________________________  EKG   ____________________________________________  RADIOLOGY Robinette Haines, personally viewed and evaluated these images (plain radiographs) as part of my medical decision making, as well as reviewing the written report by the radiologist.  DG Ankle Complete Left  Result Date: 07/18/2019 CLINICAL DATA:  Injury.  Left ankle pain after a fall. EXAM: LEFT ANKLE COMPLETE - 3+ VIEW COMPARISON:  None. FINDINGS: No fracture or bone lesion. Ankle joint normally spaced and aligned.  No arthropathic changes. Moderate plantar calcaneal spur. Soft tissues are unremarkable. IMPRESSION: No fracture or ankle joint abnormality. Electronically Signed   By: Lajean Manes M.D.   On: 07/18/2019 16:21    ____________________________________________    PROCEDURES  Procedure(s) performed:    Procedures    Medications  oxyCODONE-acetaminophen (PERCOCET/ROXICET) 5-325 MG per tablet 1 tablet (1 tablet Oral Given 07/18/19 1715)   neomycin-bacitracin-polymyxin (NEOSPORIN) ointment ( Topical Given 07/18/19 1716)  Tdap (BOOSTRIX) injection 0.5 mL (0.5 mLs Intramuscular Given 07/18/19 1723)     ____________________________________________   INITIAL IMPRESSION / ASSESSMENT AND PLAN / ED COURSE  Pertinent labs & imaging results that were available during my care of the patient were reviewed by me and considered in my medical decision making (see chart for details).  Review of the [place] CSRS was performed in accordance of the [place] prior to dispensing any controlled drugs.   Patient presented to the emergency department for evaluation after fall.  Vital signs and exam are reassuring.  Ankle x-ray negative for acute bony abnormalities.  Velcro ankle splint was placed.  Crutches were given.  Patient has a large friction abrasiona to her right shin.  No indication for repair.  Abrasion was cleaned with normal saline.  Neosporin was placed.  Wound was bandaged.  Tetanus shot was updated.  Patient is to follow up with primary care as directed. Patient is given ED precautions to return to the ED for any worsening or new symptoms.  Eleisha Branscomb was evaluated in Emergency Department on 07/18/2019 for the symptoms described in the history of present illness. She was evaluated in the context of the global COVID-19 pandemic, which necessitated consideration that the patient might be at risk for infection with the SARS-CoV-2 virus that causes COVID-19. Institutional protocols and algorithms that pertain to the evaluation of patients at risk for COVID-19 are in a state of rapid change based on information released by regulatory bodies including the CDC and federal and state organizations. These policies and algorithms were followed during the patient's care in the ED.   ____________________________________________  FINAL CLINICAL IMPRESSION(S) / ED DIAGNOSES  Final diagnoses:  Injury of left ankle, initial encounter  Friction burn       NEW MEDICATIONS STARTED DURING THIS VISIT:  ED Discharge Orders         Ordered    meloxicam (MOBIC) 7.5 MG tablet  Daily     Discontinue  Reprint     07/18/19 1754              This chart was dictated using voice recognition software/Dragon. Despite best efforts to proofread, errors can occur which can change the meaning. Any change was purely unintentional.    Laban Emperor, PA-C 07/18/19 1826    Blake Divine, MD 07/19/19 Bosie Helper

## 2019-07-18 NOTE — ED Notes (Signed)
Patient declined discharge vital signs. 

## 2019-07-22 DIAGNOSIS — Z85850 Personal history of malignant neoplasm of thyroid: Secondary | ICD-10-CM | POA: Diagnosis not present

## 2019-07-22 DIAGNOSIS — E119 Type 2 diabetes mellitus without complications: Secondary | ICD-10-CM | POA: Diagnosis not present

## 2019-07-22 DIAGNOSIS — E890 Postprocedural hypothyroidism: Secondary | ICD-10-CM | POA: Diagnosis not present

## 2019-08-03 ENCOUNTER — Other Ambulatory Visit: Payer: Self-pay | Admitting: Internal Medicine

## 2019-08-03 DIAGNOSIS — F5101 Primary insomnia: Secondary | ICD-10-CM

## 2019-08-03 NOTE — Telephone Encounter (Signed)
Please Advise. Last office visit 07/09/2019.  KP

## 2019-08-03 NOTE — Telephone Encounter (Signed)
Requested medication (s) are due for refill today -yes  Requested medication (s) are on the active medication list -yes  Future visit scheduled -yes  Last refill: 07/07/19  Notes to clinic: Request for non delegated Rx  Requested Prescriptions  Pending Prescriptions Disp Refills   zaleplon (SONATA) 10 MG capsule [Pharmacy Med Name: ZALEPLON 10 MG CAP] 30 capsule     Sig: TAKE 1 TABLET BY MOUTH AT BEDTIME AS NEEDED FOR SLEEP      Not Delegated - Psychiatry:  Anxiolytics/Hypnotics Failed - 08/03/2019  9:07 AM      Failed - This refill cannot be delegated      Failed - Urine Drug Screen completed in last 360 days.      Passed - Valid encounter within last 6 months    Recent Outpatient Visits           3 weeks ago Hyperlipidemia, mild   [facility] Glean Hess, MD   4 months ago Epigastric pain   [facility] Glean Hess, MD   6 months ago Annual physical exam   [facility] Glean Hess, MD   1 year ago Coccyx pain   [facility] Glean Hess, MD   1 year ago Neck pain on left side   [facility] Glean Hess, MD       Future Appointments             In 5 months Army Melia Jesse Sans, MD [facility], [facility]                Requested Prescriptions  Pending Prescriptions Disp Refills   zaleplon (SONATA) 10 MG capsule [Pharmacy Med Name: ZALEPLON 10 MG CAP] 30 capsule     Sig: TAKE 1 TABLET BY MOUTH AT BEDTIME AS NEEDED FOR SLEEP      Not Delegated - Psychiatry:  Anxiolytics/Hypnotics Failed - 08/03/2019  9:07 AM      Failed - This refill cannot be delegated      Failed - Urine Drug Screen completed in last 360 days.      Passed - Valid encounter within last 6 months    Recent Outpatient Visits           3 weeks ago Hyperlipidemia, mild   [facility] Glean Hess, MD   4 months ago Epigastric pain   [facility] Glean Hess, MD   6 months ago Annual  physical exam   [facility] Glean Hess, MD   1 year ago Coccyx pain   [facility] Glean Hess, MD   1 year ago Neck pain on left side   [facility] Glean Hess, MD       Future Appointments             In 5 months Army Melia Jesse Sans, MD [facility], [facility]

## 2019-08-07 ENCOUNTER — Ambulatory Visit: Payer: BC Managed Care – PPO | Admitting: Podiatry

## 2019-09-17 ENCOUNTER — Telehealth: Payer: Self-pay

## 2019-09-17 NOTE — Telephone Encounter (Signed)
Pt called back and verbalized understanding.

## 2019-09-17 NOTE — Telephone Encounter (Signed)
Noted  KP 

## 2019-09-17 NOTE — Telephone Encounter (Signed)
Called pt and left a VM that her appt was changed from 9:20 tomorrow to 11:20 and that her visit will be a video visit. Told pt to call back to let me know she got the message and also to call if she had any other questions.  KP

## 2019-09-18 ENCOUNTER — Telehealth: Payer: Self-pay

## 2019-09-18 ENCOUNTER — Telehealth (INDEPENDENT_AMBULATORY_CARE_PROVIDER_SITE_OTHER): Payer: BC Managed Care – PPO | Admitting: Internal Medicine

## 2019-09-18 ENCOUNTER — Other Ambulatory Visit: Payer: Self-pay

## 2019-09-18 ENCOUNTER — Encounter: Payer: Self-pay | Admitting: Internal Medicine

## 2019-09-18 ENCOUNTER — Other Ambulatory Visit: Payer: Self-pay | Admitting: Internal Medicine

## 2019-09-18 VITALS — Ht 66.0 in

## 2019-09-18 DIAGNOSIS — R059 Cough, unspecified: Secondary | ICD-10-CM

## 2019-09-18 DIAGNOSIS — R05 Cough: Secondary | ICD-10-CM | POA: Diagnosis not present

## 2019-09-18 DIAGNOSIS — J0100 Acute maxillary sinusitis, unspecified: Secondary | ICD-10-CM

## 2019-09-18 MED ORDER — AMOXICILLIN-POT CLAVULANATE 875-125 MG PO TABS: 1.0000 | ORAL_TABLET | Freq: Two times a day (BID) | ORAL | 0 refills | Status: AC

## 2019-09-18 MED ORDER — FLUTICASONE PROPIONATE 50 MCG/ACT NA SUSP: 2.0000 | Freq: Every day | NASAL | 1 refills | Status: DC

## 2019-09-18 MED ORDER — HYDROCODONE-HOMATROPINE 5-1.5 MG/5ML PO SYRP: 5.0000 mL | ORAL_SOLUTION | Freq: Four times a day (QID) | ORAL | 0 refills | Status: AC | PRN

## 2019-09-18 MED ORDER — HYDROCOD POLST-CPM POLST ER 10-8 MG/5ML PO SUER: 5.0000 mL | Freq: Two times a day (BID) | ORAL | 0 refills | Status: DC

## 2019-09-18 NOTE — Telephone Encounter (Signed)
This visit type is being conducted due to national recommendations for restrictions regarding the COVID- 19 Pandemic (e.g. social distancing) in effort to limit this patients exposure and mitigate transmission in our community. This visit type is felt to be most appropriate for this patient at this time.   I connected with the patient today and received telephone consent from the patient and patient understand this consent will be good for 1 year.  CM 

## 2019-09-18 NOTE — Progress Notes (Signed)
Date:  09/18/2019   Name:  Gabrielle Tucker   DOB:  1960/07/20   MRN:  [id number]   Chief Complaint: Cough (Started Sunday. Was exposed to her boss at work with [place]. Was Tested for Covid on Wednesday and you tested Negative. Sinus headche, pressure in face, - cough with drainage in throat- no mucous. Dry cough. No fever. Can still test and smell. No SOB.) Boss diagnosed once week ago.  He was not vaccinated.  She developed symptoms about 5 days ago and got tested at [facility].    Cough This is a new problem. The current episode started in the past 7 days. The cough is non-productive. Associated symptoms include headaches, nasal congestion and postnasal drip. Pertinent negatives include no chest pain, chills, ear pain, fever, shortness of breath or wheezing. Her past medical history is significant for environmental allergies.  Sinusitis This is a new problem. The current episode started in the past 7 days. There has been no fever. Associated symptoms include coughing, headaches and sinus pressure. Pertinent negatives include no chills, ear pain, neck pain, shortness of breath or swollen glands. Past treatments include spray decongestants and oral decongestants (advil helped with headache). The treatment provided mild relief.    Immunization History  Administered Date(s) Administered  . Influenza,inj,Quad PF,6+ Mos 10/30/2017, 01/07/2019  . Influenza-Unspecified 10/23/2014, 11/07/2015, 10/30/2016  . Moderna SARS-COVID-2 Vaccination 03/11/2019, 04/08/2019  . Tdap 07/18/2019    Lab Results  Component Value Date   CREATININE 0.72 03/18/2019   BUN 13 03/18/2019   NA 142 03/18/2019   K 4.5 03/18/2019   CL 104 03/18/2019   CO2 20 03/18/2019   Lab Results  Component Value Date   CHOL 152 07/09/2019   HDL 41 07/09/2019   LDLCALC 81 07/09/2019   TRIG 174 (H) 07/09/2019   CHOLHDL 3.7 07/09/2019   Lab Results  Component Value Date   TSH 0.95 10/07/2018   Lab Results  Component  Value Date   HGBA1C 6.1 02/20/2019   Lab Results  Component Value Date   WBC 7.8 01/07/2019   HGB 13.8 01/07/2019   HCT 42.2 01/07/2019   MCV 92 01/07/2019   PLT 186 01/07/2019   Lab Results  Component Value Date   ALT 30 07/09/2019   AST 34 07/09/2019   ALKPHOS 153 (H) 07/09/2019   BILITOT 0.7 07/09/2019     Review of Systems  Constitutional: Positive for fatigue. Negative for chills and fever.  HENT: Positive for postnasal drip and sinus pressure. Negative for ear pain and trouble swallowing.        No loss of taste or smell  Respiratory: Positive for cough. Negative for shortness of breath and wheezing.   Cardiovascular: Negative for chest pain and palpitations.  Gastrointestinal: Negative for diarrhea.  Musculoskeletal: Negative for neck pain.  Allergic/Immunologic: Positive for environmental allergies.  Neurological: Positive for headaches. Negative for dizziness, tremors and weakness.  Psychiatric/Behavioral: Positive for sleep disturbance (due to dry cough).    Patient Active Problem List   Diagnosis Date Noted  . Type II diabetes mellitus with complication ([place]) 28/41/3244  . Mood disorder ([place]) 01/07/2019  . S/P total thyroidectomy 12/16/2017  . Multiple thyroid nodules 11/12/2017  . Hyperlipidemia, mild 08/03/2016  . Arthritis of knee, left 08/02/2016  . Back muscle spasm 07/03/2014  . H/O peptic ulcer 07/03/2014  . Migraine without aura and responsive to treatment 07/03/2014  . Muscle contraction headache 07/03/2014  . Idiopathic insomnia 07/03/2014  . Tachycardia 07/03/2014  Allergies  Allergen Reactions  . Morphine And Related Nausea And Vomiting  . Tape Hives and Rash    No [paper tape.  Tega derm OK    Past Surgical History:  Procedure Laterality Date  . ABDOMINAL HYSTERECTOMY    . ANTERIOR FUSION CERVICAL SPINE  2006  . BREAST REDUCTION SURGERY    . CHOLECYSTECTOMY  05/05/2019  . ESOPHAGOGASTRODUODENOSCOPY  2011   gastric ulcer  .  LITHOTRIPSY  2012   with setnet  . LUMBAR DISC SURGERY    . NASAL SINUS SURGERY  2015  . partail knee replacement Bilateral   . REDUCTION MAMMAPLASTY Bilateral 2011  . THYROIDECTOMY Bilateral 12/16/2017   Procedure: THYROIDECTOMY;  Surgeon: Margaretha Sheffield, MD;  Location: [facility] ORS;  Service: ENT;  Laterality: Bilateral;    Social History   Tobacco Use  . Smoking status: Never Smoker  . Smokeless tobacco: Never Used  Vaping Use  . Vaping Use: Never used  Substance Use Topics  . Alcohol use: Yes    Alcohol/week: 0.0 - 1.0 standard drinks    Comment: on occassion  . Drug use: Not Currently    Types: Marijuana     Medication list has been reviewed and updated.  Current Meds  Medication Sig  . atorvastatin (LIPITOR) 10 MG tablet Take 1 tablet (10 mg total) by mouth daily.  . calcium-vitamin D (OSCAL WITH D) 500-200 MG-UNIT tablet Take 2 tablets by mouth 2 (two) times daily.   Marland Kitchen escitalopram (LEXAPRO) 20 MG tablet TAKE 1 TABLET(10 MG) BY MOUTH DAILY  . fluticasone (FLONASE) 50 MCG/ACT nasal spray Place 2 sprays into both nostrils daily.  . hydrOXYzine (ATARAX/VISTARIL) 25 MG tablet TK 1 TO 2 TS PO QHS  . ibuprofen (ADVIL,MOTRIN) 600 MG tablet Take 1 tablet (600 mg total) by mouth every 6 (six) hours as needed.  . Levothyroxine Sodium 150 MCG/ML SOLN Take 150 mcg by mouth daily before breakfast.   . metFORMIN (GLUCOPHAGE-XR) 500 MG 24 hr tablet Take 3 tablets by mouth daily.   . metoprolol succinate (TOPROL-XL) 50 MG 24 hr tablet Take 1 tablet (50 mg total) by mouth daily. Take with or immediately following a meal.  . pantoprazole (PROTONIX) 40 MG tablet TAKE 1 TABLET BY MOUTH TWICE DAILY  . Semaglutide,0.25 or 0.5MG /DOS, 2 MG/1.5ML SOPN Inject 0.5 mg into the skin once a week.  Marland Kitchen tiZANidine (ZANAFLEX) 4 MG tablet TAKE 1 TABLET(4 MG) BY MOUTH DAILY AS NEEDED FOR MUSCLE SPASMS  . zaleplon (SONATA) 10 MG capsule TAKE 1 TABLET BY MOUTH AT BEDTIME AS NEEDED FOR SLEEP  . [DISCONTINUED]  fluticasone (FLONASE) 50 MCG/ACT nasal spray Place 2 sprays into both nostrils daily. (Patient taking differently: Place 2 sprays into both nostrils daily. PRN)    PHQ 2/9 Scores 07/09/2019 03/18/2019 01/07/2019 12/09/2018  PHQ - 2 Score 0 0 0 0  PHQ- 9 Score 0 0 - -    GAD 7 : Generalized Anxiety Score 07/09/2019  Nervous, Anxious, on Edge 0  Control/stop worrying 0  Worry too much - different things 0  Trouble relaxing 0  Restless 0  Easily annoyed or irritable 0  Afraid - awful might happen 0  Total GAD 7 Score 0  Anxiety Difficulty Not difficult at all    BP Readings from Last 3 Encounters:  07/18/19 (!) 152/77  07/09/19 118/64  03/18/19 (!) 148/82    Physical Exam Constitutional:      Appearance: Normal appearance.  HENT:     Nose:  Right Sinus: Maxillary sinus tenderness and frontal sinus tenderness present.     Left Sinus: Frontal sinus tenderness present.     Comments: Areas tender when patient self palpated Pulmonary:     Effort: Pulmonary effort is normal.     Comments: No dyspnea noted but frequent dry cough during the encounter. Neurological:     Mental Status: She is alert.  Psychiatric:        Attention and Perception: Attention normal.        Mood and Affect: Mood normal.        Speech: Speech normal.        Cognition and Memory: Cognition normal.     Wt Readings from Last 3 Encounters:  07/18/19 219 lb (99.3 kg)  07/09/19 219 lb (99.3 kg)  03/18/19 215 lb (97.5 kg)    Ht 5\' 6"  (1.676 m)   BMI 35.35 kg/m   Assessment and Plan: 1. Acute non-recurrent maxillary sinusitis Continue Flonase and fluids Treat with antibiotics Minimal concern for Covid - no quarantine is needed - amoxicillin-clavulanate (AUGMENTIN) 875-125 MG tablet; Take 1 tablet by mouth 2 (two) times daily for 10 days.  Dispense: 20 tablet; Refill: 0 - fluticasone (FLONASE) 50 MCG/ACT nasal spray; Place 2 sprays into both nostrils daily.  Dispense: 16 g; Refill: 1  2.  Cough - chlorpheniramine-HYDROcodone (TUSSIONEX PENNKINETIC ER) 10-8 MG/5ML SUER; Take 5 mLs by mouth 2 (two) times daily.  Dispense: 120 mL; Refill: 0  I spent ~12 minutes on this encounter.  Partially dictated using Editor, commissioning. Any errors are unintentional.  Halina Maidens, MD [facility]  09/18/2019

## 2019-09-18 NOTE — Patient Instructions (Signed)
Continue Flonase spray daily.  Drink plenty of fluids to help loosen secretions.  Use the narcotic cough syrup only at night and be cautious about sedation during the day.  Take all the antibiotics, even you feel recovered before they are complete.

## 2019-09-18 NOTE — Telephone Encounter (Signed)
Informed pt to let her know that Dr. Army Melia sent in t alternative since insurance did not cover Tussinex. Left VM pts name was stated.  KP

## 2019-09-18 NOTE — Telephone Encounter (Signed)
Called pt left her know that Flonase nasal spray was not covered by there insurance. Told pt that she can purchase that OTC. Pt verbalized understanding.  KP

## 2019-09-21 ENCOUNTER — Encounter: Payer: Self-pay | Admitting: Internal Medicine

## 2019-10-07 ENCOUNTER — Encounter: Payer: Self-pay | Admitting: Internal Medicine

## 2019-10-14 DIAGNOSIS — E119 Type 2 diabetes mellitus without complications: Secondary | ICD-10-CM | POA: Diagnosis not present

## 2019-10-14 DIAGNOSIS — Z85850 Personal history of malignant neoplasm of thyroid: Secondary | ICD-10-CM | POA: Diagnosis not present

## 2019-10-14 DIAGNOSIS — E890 Postprocedural hypothyroidism: Secondary | ICD-10-CM | POA: Diagnosis not present

## 2019-10-14 LAB — HEMOGLOBIN A1C: Hemoglobin A1C: 5.8

## 2019-11-04 DIAGNOSIS — Z85850 Personal history of malignant neoplasm of thyroid: Secondary | ICD-10-CM | POA: Diagnosis not present

## 2019-11-04 DIAGNOSIS — E890 Postprocedural hypothyroidism: Secondary | ICD-10-CM | POA: Diagnosis not present

## 2019-11-05 DIAGNOSIS — L508 Other urticaria: Secondary | ICD-10-CM | POA: Diagnosis not present

## 2019-11-20 ENCOUNTER — Encounter: Payer: Self-pay | Admitting: Internal Medicine

## 2019-11-20 ENCOUNTER — Other Ambulatory Visit: Payer: Self-pay | Admitting: Internal Medicine

## 2019-11-20 ENCOUNTER — Ambulatory Visit (INDEPENDENT_AMBULATORY_CARE_PROVIDER_SITE_OTHER): Payer: BC Managed Care – PPO | Admitting: Internal Medicine

## 2019-11-20 ENCOUNTER — Other Ambulatory Visit: Payer: Self-pay

## 2019-11-20 VITALS — BP 122/72 | HR 82 | Temp 98.4°F | Ht 66.0 in | Wt 223.0 lb

## 2019-11-20 DIAGNOSIS — M549 Dorsalgia, unspecified: Secondary | ICD-10-CM

## 2019-11-20 DIAGNOSIS — M542 Cervicalgia: Secondary | ICD-10-CM

## 2019-11-20 DIAGNOSIS — R102 Pelvic and perineal pain: Secondary | ICD-10-CM | POA: Diagnosis not present

## 2019-11-20 LAB — POCT URINALYSIS DIPSTICK
Bilirubin, UA: NEGATIVE
Blood, UA: NEGATIVE
Glucose, UA: NEGATIVE
Ketones, UA: NEGATIVE
Leukocytes, UA: NEGATIVE
Nitrite, UA: NEGATIVE
Protein, UA: NEGATIVE
Spec Grav, UA: >=1.03 — AB (ref 1.010–1.025)
Urobilinogen, UA: NEGATIVE E.U./dL — AB
pH, UA: 6 (ref 5.0–8.0)

## 2019-11-20 NOTE — Progress Notes (Signed)
Date:  11/20/2019   Name:  Gabrielle Tucker   DOB:  January 26, 1960   MRN:  [id number]   Chief Complaint: Urinary Tract Infection (Left lower back pain. Possible kidney infection or uti. X2 weeks. Pressure in pelvic area. Started AZO week.)  Back Pain This is a new problem. The current episode started in the past 7 days. The problem is unchanged. The pain is present in the thoracic spine. The quality of the pain is described as aching. The pain does not radiate. The pain is mild. The symptoms are aggravated by twisting and bending. Associated symptoms include pelvic pain. Pertinent negatives include no abdominal pain, chest pain, dysuria, fever or headaches.  Pelvic Pain The patient's primary symptoms include pelvic pain. The patient's pertinent negatives include no genital lesions or vaginal bleeding. This is a new problem. The current episode started more than 1 month ago. The problem occurs daily. The problem has been unchanged. The pain is mild. The problem affects both sides. Associated symptoms include back pain, constipation (not regular) and flank pain. Pertinent negatives include no abdominal pain, chills, dysuria, fever, frequency or headaches.    Lab Results  Component Value Date   CREATININE 0.72 03/18/2019   BUN 13 03/18/2019   NA 142 03/18/2019   K 4.5 03/18/2019   CL 104 03/18/2019   CO2 20 03/18/2019   Lab Results  Component Value Date   CHOL 152 07/09/2019   HDL 41 07/09/2019   LDLCALC 81 07/09/2019   TRIG 174 (H) 07/09/2019   CHOLHDL 3.7 07/09/2019   Lab Results  Component Value Date   TSH 0.95 10/07/2018   Lab Results  Component Value Date   HGBA1C 6.1 02/20/2019   Lab Results  Component Value Date   WBC 7.8 01/07/2019   HGB 13.8 01/07/2019   HCT 42.2 01/07/2019   MCV 92 01/07/2019   PLT 186 01/07/2019   Lab Results  Component Value Date   ALT 30 07/09/2019   AST 34 07/09/2019   ALKPHOS 153 (H) 07/09/2019   BILITOT 0.7 07/09/2019     Review  of Systems  Constitutional: Negative for chills, diaphoresis and fever.  Respiratory: Negative for chest tightness and shortness of breath.   Cardiovascular: Negative for chest pain.  Gastrointestinal: Positive for constipation (not regular). Negative for abdominal distention, abdominal pain and blood in stool.  Genitourinary: Positive for flank pain and pelvic pain. Negative for dysuria and frequency.  Musculoskeletal: Positive for back pain.  Neurological: Negative for headaches.    Patient Active Problem List   Diagnosis Date Noted  . Type II diabetes mellitus with complication ([place]) 63/87/5643  . Mood disorder ([place]) 01/07/2019  . S/P total thyroidectomy 12/16/2017  . Multiple thyroid nodules 11/12/2017  . Hyperlipidemia, mild 08/03/2016  . Arthritis of knee, left 08/02/2016  . Back muscle spasm 07/03/2014  . H/O peptic ulcer 07/03/2014  . Migraine without aura and responsive to treatment 07/03/2014  . Muscle contraction headache 07/03/2014  . Idiopathic insomnia 07/03/2014  . Tachycardia 07/03/2014    Allergies  Allergen Reactions  . Morphine And Related Nausea And Vomiting  . Tape Hives and Rash    No [paper tape.  Tega derm OK    Past Surgical History:  Procedure Laterality Date  . ABDOMINAL HYSTERECTOMY     ovaries remain  . ANTERIOR FUSION CERVICAL SPINE  2006  . BREAST REDUCTION SURGERY    . CHOLECYSTECTOMY  05/05/2019  . ESOPHAGOGASTRODUODENOSCOPY  2011   gastric ulcer  .  LITHOTRIPSY  2012   with setnet  . LUMBAR DISC SURGERY    . NASAL SINUS SURGERY  2015  . partail knee replacement Bilateral   . REDUCTION MAMMAPLASTY Bilateral 2011  . THYROIDECTOMY Bilateral 12/16/2017   Procedure: THYROIDECTOMY;  Surgeon: Margaretha Sheffield, MD;  Location: [facility] ORS;  Service: ENT;  Laterality: Bilateral;    Social History   Tobacco Use  . Smoking status: Never Smoker  . Smokeless tobacco: Never Used  Vaping Use  . Vaping Use: Never used  Substance Use Topics  .  Alcohol use: Yes    Alcohol/week: 0.0 - 1.0 standard drinks    Comment: on occassion  . Drug use: Not Currently    Types: Marijuana     Medication list has been reviewed and updated.  Current Meds  Medication Sig  . atorvastatin (LIPITOR) 10 MG tablet Take 1 tablet (10 mg total) by mouth daily.  . calcium-vitamin D (OSCAL WITH D) 500-200 MG-UNIT tablet Take 2 tablets by mouth 2 (two) times daily.   Marland Kitchen escitalopram (LEXAPRO) 20 MG tablet TAKE 1 TABLET(10 MG) BY MOUTH DAILY  . fluticasone (FLONASE) 50 MCG/ACT nasal spray Place 2 sprays into both nostrils daily.  . hydrOXYzine (ATARAX/VISTARIL) 25 MG tablet TK 1 TO 2 TS PO QHS  . ibuprofen (ADVIL,MOTRIN) 600 MG tablet Take 1 tablet (600 mg total) by mouth every 6 (six) hours as needed.  . Levothyroxine Sodium 150 MCG/ML SOLN Take 150 mcg by mouth daily before breakfast.   . metFORMIN (GLUCOPHAGE-XR) 500 MG 24 hr tablet Take 3 tablets by mouth daily.   . metoprolol succinate (TOPROL-XL) 50 MG 24 hr tablet Take 1 tablet (50 mg total) by mouth daily. Take with or immediately following a meal.  . pantoprazole (PROTONIX) 40 MG tablet TAKE 1 TABLET BY MOUTH TWICE DAILY  . Semaglutide,0.25 or 0.5MG /DOS, 2 MG/1.5ML SOPN Inject 0.5 mg into the skin once a week.  Marland Kitchen tiZANidine (ZANAFLEX) 4 MG tablet TAKE (1) TABLET BY MOUTH DAILY AS NEEDEDFOR MUSCLE SPASMS.  . zaleplon (SONATA) 10 MG capsule TAKE 1 TABLET BY MOUTH AT BEDTIME AS NEEDED FOR SLEEP    PHQ 2/9 Scores 11/20/2019 07/09/2019 03/18/2019 01/07/2019  PHQ - 2 Score 0 0 0 0  PHQ- 9 Score 0 0 0 -    GAD 7 : Generalized Anxiety Score 11/20/2019 07/09/2019  Nervous, Anxious, on Edge 0 0  Control/stop worrying 0 0  Worry too much - different things 0 0  Trouble relaxing 0 0  Restless 0 0  Easily annoyed or irritable 0 0  Afraid - awful might happen 0 0  Total GAD 7 Score 0 0  Anxiety Difficulty Not difficult at all Not difficult at all    BP Readings from Last 3 Encounters:  11/20/19  122/72  07/18/19 (!) 152/77  07/09/19 118/64    Physical Exam Vitals and nursing note reviewed.  Constitutional:      General: She is not in acute distress.    Appearance: She is well-developed.  HENT:     Head: Normocephalic and atraumatic.  Cardiovascular:     Rate and Rhythm: Normal rate and regular rhythm.     Pulses: Normal pulses.     Heart sounds: No murmur heard.   Pulmonary:     Effort: Pulmonary effort is normal. No respiratory distress.     Breath sounds: No wheezing or rhonchi.  Abdominal:     Palpations: Abdomen is soft.     Tenderness: There is abdominal  tenderness in the suprapubic area. There is no guarding or rebound.  Musculoskeletal:     Thoracic back: Tenderness present. Decreased range of motion.  Skin:    General: Skin is warm and dry.     Findings: No rash.  Neurological:     Mental Status: She is alert and oriented to person, place, and time.  Psychiatric:        Behavior: Behavior normal.        Thought Content: Thought content normal.     Wt Readings from Last 3 Encounters:  11/20/19 223 lb (101.2 kg)  07/18/19 219 lb (99.3 kg)  07/09/19 219 lb (99.3 kg)    BP 122/72   Pulse 82   Temp 98.4 F (36.9 C) (Oral)   Ht 5\' 6"  (1.676 m)   Wt 223 lb (101.2 kg)   SpO2 97%   BMI 35.99 kg/m   Assessment and Plan: 1. Mid back pain on left side Recommend heat, resume tizanidine NSAID as needed  2. Pelvic pain in female S/p partial hysterectomy for fibroids UA is negative - need [place] to further evaluate - US Pelvis Complete; Future - POCT urinalysis dipstick   Partially dictated using Doctor Phillips. Any errors are unintentional.  Halina Maidens, MD [facility]  11/20/2019

## 2019-11-26 ENCOUNTER — Other Ambulatory Visit: Payer: Self-pay

## 2019-11-26 ENCOUNTER — Ambulatory Visit
Admission: RE | Admit: 2019-11-26 | Discharge: 2019-11-26 | Disposition: A | Discharge status: Home / Self Care | Payer: BC Managed Care – PPO | Source: Ambulatory Visit | Attending: Internal Medicine | Admitting: Internal Medicine

## 2019-11-26 DIAGNOSIS — Z780 Asymptomatic menopausal state: Secondary | ICD-10-CM | POA: Diagnosis not present

## 2019-11-26 DIAGNOSIS — R102 Pelvic and perineal pain: Secondary | ICD-10-CM | POA: Diagnosis not present

## 2019-12-04 DIAGNOSIS — H6980 Other specified disorders of Eustachian tube, unspecified ear: Secondary | ICD-10-CM | POA: Diagnosis not present

## 2019-12-04 DIAGNOSIS — E039 Hypothyroidism, unspecified: Secondary | ICD-10-CM | POA: Diagnosis not present

## 2019-12-04 DIAGNOSIS — J301 Allergic rhinitis due to pollen: Secondary | ICD-10-CM | POA: Diagnosis not present

## 2019-12-04 DIAGNOSIS — H93293 Other abnormal auditory perceptions, bilateral: Secondary | ICD-10-CM | POA: Diagnosis not present

## 2019-12-10 ENCOUNTER — Other Ambulatory Visit: Payer: Self-pay | Admitting: Internal Medicine

## 2019-12-10 ENCOUNTER — Encounter: Payer: Self-pay | Admitting: Internal Medicine

## 2019-12-10 DIAGNOSIS — F39 Unspecified mood [affective] disorder: Secondary | ICD-10-CM

## 2019-12-11 ENCOUNTER — Ambulatory Visit (INDEPENDENT_AMBULATORY_CARE_PROVIDER_SITE_OTHER): Payer: BC Managed Care – PPO | Admitting: Internal Medicine

## 2019-12-11 ENCOUNTER — Other Ambulatory Visit: Payer: Self-pay

## 2019-12-11 ENCOUNTER — Encounter: Payer: Self-pay | Admitting: Internal Medicine

## 2019-12-11 VITALS — BP 126/76 | HR 81 | Temp 98.3°F | Ht 66.0 in | Wt 224.0 lb

## 2019-12-11 DIAGNOSIS — J0100 Acute maxillary sinusitis, unspecified: Secondary | ICD-10-CM

## 2019-12-11 MED ORDER — FLUTICASONE PROPIONATE 50 MCG/ACT NA SUSP: 2.0000 | Freq: Every day | NASAL | 1 refills | Status: DC

## 2019-12-11 MED ORDER — AMOXICILLIN-POT CLAVULANATE 875-125 MG PO TABS: 1.0000 | ORAL_TABLET | Freq: Two times a day (BID) | ORAL | 0 refills | Status: AC

## 2019-12-11 NOTE — Progress Notes (Signed)
Date:  12/11/2019   Name:  Gabrielle Tucker   DOB:  [birth year]   MRN:  [id number]   Chief Complaint: Sinusitis (X1 week ,neg covid, cough, headache, sore throat, congestion, fatigue )  Sinusitis This is a recurrent problem. The current episode started in the past 7 days. The problem has been gradually worsening since onset. There has been no fever. The pain is mild. Associated symptoms include congestion, coughing, diaphoresis (but did not check her temperature), headaches, sinus pressure and a sore throat. (Covid test negative) Past treatments include oral decongestants and spray decongestants.  Three days ago she got the [place] booster and now she is very fatigued.  She has a cough but no SOB and only minimal yellow sputum.  Immunization History  Administered Date(s) Administered  . Influenza,inj,Quad PF,6+ Mos 10/30/2017, 01/07/2019  . Influenza-Unspecified 10/23/2014, 11/07/2015, 11/04/2019  . Moderna SARS-COVID-2 Vaccination 03/11/2019, 04/08/2019  . Tdap 07/18/2019    Lab Results  Component Value Date   CREATININE 0.72 03/18/2019   BUN 13 03/18/2019   NA 142 03/18/2019   K 4.5 03/18/2019   CL 104 03/18/2019   CO2 20 03/18/2019   Lab Results  Component Value Date   CHOL 152 07/09/2019   HDL 41 07/09/2019   LDLCALC 81 07/09/2019   TRIG 174 (H) 07/09/2019   CHOLHDL 3.7 07/09/2019   Lab Results  Component Value Date   TSH 0.95 10/07/2018   Lab Results  Component Value Date   HGBA1C 6.1 02/20/2019   Lab Results  Component Value Date   WBC 7.8 01/07/2019   HGB 13.8 01/07/2019   HCT 42.2 01/07/2019   MCV 92 01/07/2019   PLT 186 01/07/2019   Lab Results  Component Value Date   ALT 30 07/09/2019   AST 34 07/09/2019   ALKPHOS 153 (H) 07/09/2019   BILITOT 0.7 07/09/2019     Review of Systems  Constitutional: Positive for diaphoresis (but did not check her temperature) and fatigue. Negative for fever.  HENT: Positive for congestion, sinus pressure and  sore throat. Negative for postnasal drip and trouble swallowing.   Eyes: Negative for visual disturbance.  Respiratory: Positive for cough.   Cardiovascular: Negative for chest pain and palpitations.  Neurological: Positive for headaches. Negative for dizziness, weakness and light-headedness.    Patient Active Problem List   Diagnosis Date Noted  . Type II diabetes mellitus with complication ([place]) 53/29/9242  . Mood disorder ([place]) 01/07/2019  . S/P total thyroidectomy 12/16/2017  . Multiple thyroid nodules 11/12/2017  . Hyperlipidemia, mild 08/03/2016  . Arthritis of knee, left 08/02/2016  . Back muscle spasm 07/03/2014  . H/O peptic ulcer 07/03/2014  . Migraine without aura and responsive to treatment 07/03/2014  . Muscle contraction headache 07/03/2014  . Idiopathic insomnia 07/03/2014  . Tachycardia 07/03/2014    Allergies  Allergen Reactions  . Morphine And Related Nausea And Vomiting  . Tape Hives and Rash    No [paper tape.  Tega derm OK    Past Surgical History:  Procedure Laterality Date  . ABDOMINAL HYSTERECTOMY     ovaries remain  . ANTERIOR FUSION CERVICAL SPINE  2006  . BREAST REDUCTION SURGERY    . CHOLECYSTECTOMY  05/05/2019  . ESOPHAGOGASTRODUODENOSCOPY  2011   gastric ulcer  . LITHOTRIPSY  2012   with setnet  . LUMBAR DISC SURGERY    . NASAL SINUS SURGERY  2015  . partail knee replacement Bilateral   . REDUCTION MAMMAPLASTY Bilateral 2011  .  THYROIDECTOMY Bilateral 12/16/2017   Procedure: THYROIDECTOMY;  Surgeon: Margaretha Sheffield, MD;  Location: [facility] ORS;  Service: ENT;  Laterality: Bilateral;    Social History   Tobacco Use  . Smoking status: Never Smoker  . Smokeless tobacco: Never Used  Vaping Use  . Vaping Use: Never used  Substance Use Topics  . Alcohol use: Yes    Alcohol/week: 0.0 - 1.0 standard drinks    Comment: on occassion  . Drug use: Not Currently    Types: Marijuana     Medication list has been reviewed and  updated.  Current Meds  Medication Sig  . atorvastatin (LIPITOR) 10 MG tablet Take 1 tablet (10 mg total) by mouth daily.  Marland Kitchen escitalopram (LEXAPRO) 20 MG tablet TAKE (1) TABLET BY MOUTH EVERY DAY  . Fexofenadine HCl (MUCINEX ALLERGY PO) Take by mouth as needed.  . fluticasone (FLONASE) 50 MCG/ACT nasal spray Place 2 sprays into both nostrils daily.  . hydrOXYzine (ATARAX/VISTARIL) 25 MG tablet TK 1 TO 2 TS PO QHS  . ibuprofen (ADVIL,MOTRIN) 600 MG tablet Take 1 tablet (600 mg total) by mouth every 6 (six) hours as needed.  . Levothyroxine Sodium 150 MCG/ML SOLN Take 150 mcg by mouth daily before breakfast.   . metoprolol succinate (TOPROL-XL) 50 MG 24 hr tablet Take 1 tablet (50 mg total) by mouth daily. Take with or immediately following a meal.  . pantoprazole (PROTONIX) 40 MG tablet TAKE 1 TABLET BY MOUTH TWICE DAILY  . Semaglutide,0.25 or 0.5MG /DOS, 2 MG/1.5ML SOPN Inject 0.5 mg into the skin once a week.  Marland Kitchen tiZANidine (ZANAFLEX) 4 MG tablet TAKE (1) TABLET BY MOUTH DAILY AS NEEDEDFOR MUSCLE SPASMS.  . zaleplon (SONATA) 10 MG capsule TAKE 1 TABLET BY MOUTH AT BEDTIME AS NEEDED FOR SLEEP  . [DISCONTINUED] metFORMIN (GLUCOPHAGE-XR) 500 MG 24 hr tablet Take 3 tablets by mouth daily.     PHQ 2/9 Scores 12/11/2019 11/20/2019 07/09/2019 03/18/2019  PHQ - 2 Score 0 0 0 0  PHQ- 9 Score 0 0 0 0    GAD 7 : Generalized Anxiety Score 12/11/2019 11/20/2019 07/09/2019  Nervous, Anxious, on Edge 0 0 0  Control/stop worrying 0 0 0  Worry too much - different things 0 0 0  Trouble relaxing 0 0 0  Restless 0 0 0  Easily annoyed or irritable 0 0 0  Afraid - awful might happen 0 0 0  Total GAD 7 Score 0 0 0  Anxiety Difficulty - Not difficult at all Not difficult at all    BP Readings from Last 3 Encounters:  12/11/19 126/76  11/20/19 122/72  07/18/19 (!) 152/77    Physical Exam Constitutional:      Appearance: She is well-developed. She is ill-appearing.  HENT:     Right Ear: Ear canal  and external ear normal. Tympanic membrane is not erythematous or retracted.     Left Ear: Ear canal and external ear normal. Tympanic membrane is not erythematous or retracted.     Nose:     Right Sinus: Maxillary sinus tenderness and frontal sinus tenderness present.     Left Sinus: Maxillary sinus tenderness and frontal sinus tenderness present.     Mouth/Throat:     Mouth: No oral lesions.     Pharynx: Uvula midline. Posterior oropharyngeal erythema (with white mucoid drainage) present. No oropharyngeal exudate.  Cardiovascular:     Rate and Rhythm: Normal rate and regular rhythm.     Pulses: Normal pulses.  Heart sounds: Normal heart sounds.  Pulmonary:     Effort: Pulmonary effort is normal.     Breath sounds: Normal breath sounds and air entry. No wheezing, rhonchi or rales.  Musculoskeletal:     Cervical back: Normal range of motion.  Lymphadenopathy:     Cervical: No cervical adenopathy.  Neurological:     Mental Status: She is alert and oriented to person, place, and time.     Wt Readings from Last 3 Encounters:  12/11/19 224 lb (101.6 kg)  11/20/19 223 lb (101.2 kg)  07/18/19 219 lb (99.3 kg)    BP 126/76   Pulse 81   Temp 98.3 F (36.8 C) (Oral)   Ht 5\' 6"  (1.676 m)   Wt 224 lb (101.6 kg)   SpO2 97%   BMI 36.15 kg/m   Assessment and Plan: 1. Acute maxillary sinusitis, recurrence not specified Continue Allegra, Mucinex and Flonase - amoxicillin-clavulanate (AUGMENTIN) 875-125 MG tablet; Take 1 tablet by mouth 2 (two) times daily for 10 days.  Dispense: 20 tablet; Refill: 0 - fluticasone (FLONASE) 50 MCG/ACT nasal spray; Place 2 sprays into both nostrils daily.  Dispense: 16 g; Refill: 1    Partially dictated using Editor, commissioning. Any errors are unintentional.  Halina Maidens, MD [facility]  12/11/2019

## 2020-01-11 ENCOUNTER — Other Ambulatory Visit: Payer: Self-pay | Admitting: Internal Medicine

## 2020-01-11 DIAGNOSIS — F39 Unspecified mood [affective] disorder: Secondary | ICD-10-CM

## 2020-01-11 NOTE — Telephone Encounter (Signed)
Requested Prescriptions  Pending Prescriptions Disp Refills  . escitalopram (LEXAPRO) 20 MG tablet [Pharmacy Med Name: ESCITALOPRAM OXALATE 20 MG TAB] 30 tablet 0    Sig: TAKE (1) TABLET BY MOUTH EVERY DAY     Psychiatry:  Antidepressants - SSRI Passed - 01/11/2020  9:04 AM      Passed - Valid encounter within last 6 months    Recent Outpatient Visits          1 month ago Acute maxillary sinusitis, recurrence not specified   [facility] Glean Hess, MD   1 month ago Mid back pain on left side   [facility] Glean Hess, MD   3 months ago Acute non-recurrent maxillary sinusitis   [facility] Glean Hess, MD   6 months ago Hyperlipidemia, mild   [facility] Glean Hess, MD   9 months ago Epigastric pain   [facility] Glean Hess, MD      Future Appointments            In 2 days Glean Hess, MD [facility]

## 2020-01-13 ENCOUNTER — Encounter: Payer: BC Managed Care – PPO | Admitting: Internal Medicine

## 2020-01-28 ENCOUNTER — Other Ambulatory Visit: Payer: Self-pay | Admitting: Internal Medicine

## 2020-01-28 ENCOUNTER — Encounter: Payer: Self-pay | Admitting: Internal Medicine

## 2020-01-28 DIAGNOSIS — F5101 Primary insomnia: Secondary | ICD-10-CM

## 2020-02-12 ENCOUNTER — Other Ambulatory Visit: Payer: Self-pay | Admitting: Internal Medicine

## 2020-02-12 DIAGNOSIS — F39 Unspecified mood [affective] disorder: Secondary | ICD-10-CM

## 2020-02-16 DIAGNOSIS — M25552 Pain in left hip: Secondary | ICD-10-CM | POA: Diagnosis not present

## 2020-02-22 ENCOUNTER — Encounter: Payer: Self-pay | Admitting: Internal Medicine

## 2020-02-22 ENCOUNTER — Telehealth: Payer: BC Managed Care – PPO | Admitting: Family Medicine

## 2020-02-22 ENCOUNTER — Telehealth: Payer: Self-pay

## 2020-02-22 DIAGNOSIS — R5383 Other fatigue: Secondary | ICD-10-CM

## 2020-02-22 DIAGNOSIS — R0981 Nasal congestion: Secondary | ICD-10-CM

## 2020-02-22 DIAGNOSIS — J321 Chronic frontal sinusitis: Secondary | ICD-10-CM | POA: Diagnosis not present

## 2020-02-22 MED ORDER — AMOXICILLIN-POT CLAVULANATE 875-125 MG PO TABS: 1.0000 | ORAL_TABLET | Freq: Two times a day (BID) | ORAL | 0 refills | Status: DC

## 2020-02-22 NOTE — Progress Notes (Signed)
Ms. analisse, randle are scheduled for a virtual visit with your provider today.    Just as we do with appointments in the office, we must obtain your consent to participate.  Your consent will be active for this visit and any virtual visit you may have with one of our providers in the next 365 days.    If you have a MyChart account, I can also send a copy of this consent to you electronically.  All virtual visits are billed to your insurance company just like a traditional visit in the office.  As this is a virtual visit, video technology does not allow for your provider to perform a traditional examination.  This may limit your provider's ability to fully assess your condition.  If your provider identifies any concerns that need to be evaluated in person or the need to arrange testing such as labs, EKG, etc, we will make arrangements to do so.    Although advances in technology are sophisticated, we cannot ensure that it will always work on either your end or our end.  If the connection with a video visit is poor, we may have to switch to a telephone visit.  With either a video or telephone visit, we are not always able to ensure that we have a secure connection.   I need to obtain your verbal consent now.   Are you willing to proceed with your visit today?   [name] has provided verbal consent on 02/22/2020 for a virtual visit (video or telephone).  Virtual Visit via Video Note  I connected with Gabrielle Tucker on 02/22/20 at 10:15 AM EST by a video enabled telemedicine application and verified that I am speaking with the correct person using two identifiers.  Location: Patient: Home Provider: Home   I discussed the limitations of evaluation and management by telemedicine and the availability of in person appointments. The patient expressed understanding and agreed to proceed.  History of Present Illness:   Gabrielle Tucker is a 60 year old female that presents via video with  complaints of sinus pressure, sinus pain, and headache over the past several days.  Sinusitis has been unrelieved by over-the-counter saline and Flonase.  Patient denies any sick contacts, recent travel, or exposure to COVID-19.  Sinusitis This is a chronic problem. The current episode started in the past 7 days. The problem is unchanged. There has been no fever. Her pain is at a severity of 3/10. The pain is mild. Associated symptoms include congestion, headaches and sinus pressure. Pertinent negatives include no chills, coughing, diaphoresis, ear pain, hoarse voice, neck pain, shortness of breath, sneezing, sore throat or swollen glands. Past treatments include saline sprays and spray decongestants. The treatment provided no relief.   Past Medical History:  Diagnosis Date  . Depression   . Dysrhythmia    tachycardia  . Gastric ulcer   . 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 Hypertension   . Personal history of radiation therapy    had iodine rad therapy   Allergies  Allergen Reactions  . Morphine And Related Nausea And Vomiting  . Tape Hives and Rash    No [paper tape.  Tega derm OK   Social History   Socioeconomic History  . Marital status: Married    Spouse name: Not on file  . Number of children: Not on file  . Years of education: Not on file  . Highest education level: Not on file  Occupational History  . Not  on file  Tobacco Use  . Smoking status: Never Smoker  . Smokeless tobacco: Never Used  Vaping Use  . Vaping Use: Never used  Substance and Sexual Activity  . Alcohol use: Yes    Alcohol/week: 0.0 - 1.0 standard drinks    Comment: on occassion  . Drug use: Not Currently    Types: Marijuana  . Sexual activity: Not on file  Other Topics Concern  . Not on file  Social History Narrative  . Not on file   Social Determinants of Health   Financial Resource Strain: Not on file  Food Insecurity: Not on file  Transportation Needs: Not on file   Physical Activity: Not on file  Stress: Not on file  Social Connections: Not on file  Intimate Partner Violence: Not on file    Review of Systems  Constitutional: Negative for chills and diaphoresis.  HENT: Positive for congestion and sinus pressure. Negative for ear pain, hoarse voice, sneezing and sore throat.   Respiratory: Negative for cough and shortness of breath.   Musculoskeletal: Negative for neck pain.  Neurological: Positive for headaches.        Assessment and Plan: 1. Sinusitis chronic, frontal Recommend that patient continue Flonase 1 spray to each nare daily Utilize over-the-counter saline as needed - amoxicillin-clavulanate (AUGMENTIN) 875-125 MG tablet; Take 1 tablet by mouth 2 (two) times daily.  Dispense: 20 tablet; Refill: 0  2. Nasal congestion Refer to #1  3. Other fatigue   Follow Up Instructions:   Follow-up with PCP, first available appointment. I discussed the assessment and treatment plan with the patient. The patient was provided an opportunity to ask questions and all were answered. The patient agreed with the plan and demonstrated an understanding of the instructions.   The patient was advised to call back or seek an in-person evaluation if the symptoms worsen or if the condition fails to improve as anticipated.  I provided 7 minutes of non-face-to-face time during this encounter.  Donia Pounds  APRN, MSN, FNP-C [facility] [address] [place], [place] [address] [phone] 02/22/2020  10:42 AM

## 2020-02-22 NOTE — Telephone Encounter (Signed)
Please call pt to schedule appt. Same day appt slot is ok.   KP

## 2020-02-22 NOTE — Patient Instructions (Addendum)
Thank you for allowing [place] to be a part of your care   Sinusitis, Adult Sinusitis is soreness and swelling (inflammation) of your sinuses. Sinuses are hollow spaces in the bones around your face. They are located:  Around your eyes.  In the middle of your forehead.  Behind your nose.  In your cheekbones. Your sinuses and nasal passages are lined with a fluid called mucus. Mucus drains out of your sinuses. Swelling can trap mucus in your sinuses. This lets germs (bacteria, virus, or fungus) grow, which leads to infection. Most of the time, this condition is caused by a virus. What are the causes? This condition is caused by:  Allergies.  Asthma.  Germs.  Things that block your nose or sinuses.  Growths in the nose (nasal polyps).  Chemicals or irritants in the air.  Fungus (rare). What increases the risk? You are more likely to develop this condition if:  You have a weak body defense system (immune system).  You do a lot of swimming or diving.  You use nasal sprays too much.  You smoke. What are the signs or symptoms? The main symptoms of this condition are pain and a feeling of pressure around the sinuses. Other symptoms include:  Stuffy nose (congestion).  Runny nose (drainage).  Swelling and warmth in the sinuses.  Headache.  Toothache.  A cough that may get worse at night.  Mucus that collects in the throat or the back of the nose (postnasal drip).  Being unable to smell and taste.  Being very tired (fatigue).  A fever.  Sore throat.  Bad breath. How is this diagnosed? This condition is diagnosed based on:  Your symptoms.  Your medical history.  A physical exam.  Tests to find out if your condition is short-term (acute) or long-term (chronic). Your doctor may: ? Check your nose for growths (polyps). ? Check your sinuses using a tool that has a light (endoscope). ? Check for allergies or germs. ? Do imaging tests, such as an MRI or CT  scan. How is this treated? Treatment for this condition depends on the cause and whether it is short-term or long-term.  If caused by a virus, your symptoms should go away on their own within 10 days. You may be given medicines to relieve symptoms. They include: ? Medicines that shrink swollen tissue in the nose. ? Medicines that treat allergies (antihistamines). ? A spray that treats swelling of the nostrils. ? Rinses that help get rid of thick mucus in your nose (nasal saline washes).  If caused by bacteria, your doctor may wait to see if you will get better without treatment. You may be given antibiotic medicine if you have: ? A very bad infection. ? A weak body defense system.  If caused by growths in the nose, you may need to have surgery. Follow these instructions at home: Medicines  Take, use, or apply over-the-counter and prescription medicines only as told by your doctor. These may include nasal sprays.  If you were prescribed an antibiotic medicine, take it as told by your doctor. Do not stop taking the antibiotic even if you start to feel better. Hydrate and humidify  Drink enough water to keep your pee (urine) pale yellow.  Use a cool mist humidifier to keep the humidity level in your home above 50%.  Breathe in steam for 10-15 minutes, 3-4 times a day, or as told by your doctor. You can do this in the bathroom while a hot shower  is running.  Try not to spend time in cool or dry air.   Rest  Rest as much as you can.  Sleep with your head raised (elevated).  Make sure you get enough sleep each night. General instructions  Put a warm, moist washcloth on your face 3-4 times a day, or as often as told by your doctor. This will help with discomfort.  Wash your hands often with soap and water. If there is no soap and water, use hand sanitizer.  Do not smoke. Avoid being around people who are smoking (secondhand smoke).  Keep all follow-up visits as told by your  doctor. This is important.   Contact a doctor if:  You have a fever.  Your symptoms get worse.  Your symptoms do not get better within 10 days. Get help right away if:  You have a very bad headache.  You cannot stop throwing up (vomiting).  You have very bad pain or swelling around your face or eyes.  You have trouble seeing.  You feel confused.  Your neck is stiff.  You have trouble breathing. Summary  Sinusitis is swelling of your sinuses. Sinuses are hollow spaces in the bones around your face.  This condition is caused by tissues in your nose that become inflamed or swollen. This traps germs. These can lead to infection.  If you were prescribed an antibiotic medicine, take it as told by your doctor. Do not stop taking it even if you start to feel better.  Keep all follow-up visits as told by your doctor. This is important. This information is not intended to replace advice given to you by your health care provider. Make sure you discuss any questions you have with your health care provider. Document Revised: 06/10/2017 Document Reviewed: 06/10/2017 Elsevier Patient Education  2021 [place].   Amoxicillin; Clavulanic Acid Tablets What is this medicine? AMOXICILLIN; CLAVULANIC ACID (a mox i SIL in; KLAV yoo lan ic AS id) is a penicillin antibiotic. It treats some infections caused by bacteria. It will not work for colds, the flu, or other viruses. This medicine may be used for other purposes; ask your health care provider or pharmacist if you have questions. COMMON BRAND NAME(S): Augmentin What should I tell my health care provider before I take this medicine? They need to know if you have any of these conditions:  kidney disease  liver disease  mononucleosis  stomach or intestine problems such as colitis  an unusual or allergic reaction to amoxicillin, other penicillin or cephalosporin antibiotics, clavulanic acid, other medicines, foods, dyes, or  preservatives  pregnant or trying to get pregnant  breast-feeding How should I use this medicine? Take this drug by mouth. Take it as directed on the prescription label at the same time every day. Take it with food at the start of a meal or snack. Take all of this drug unless your health care provider tells you to stop it early. Keep taking it even if you think you are better. Talk to your health care provider about the use of this drug in children. While it may be prescribed for selected conditions, precautions do apply. Overdosage: If you think you have taken too much of this medicine contact a poison control center or emergency room at once. NOTE: This medicine is only for you. Do not share this medicine with others. What if I miss a dose? If you miss a dose, take it as soon as you can. If it is almost time  for your next dose, take only that dose. Do not take double or extra doses. What may interact with this medicine?  allopurinol  anticoagulants  birth control pills  methotrexate  probenecid This list may not describe all possible interactions. Give your health care provider a list of all the medicines, herbs, non-prescription drugs, or dietary supplements you use. Also tell them if you smoke, drink alcohol, or use illegal drugs. Some items may interact with your medicine. What should I watch for while using this medicine? Tell your health care provider if your symptoms do not start to get better or if they get worse. This medicine may cause serious skin reactions. They can happen weeks to months after starting the medicine. Contact your health care provider right away if you notice fevers or flu-like symptoms with a rash. The rash may be red or purple and then turn into blisters or peeling of the skin. Or, you might notice a red rash with swelling of the face, lips or lymph nodes in your neck or under your arms. Do not treat diarrhea with over the counter products. Contact your  health care provider if you have diarrhea that lasts more than 2 days or if it is severe and watery. If you have diabetes, you may get a false-positive result for sugar in your urine. Check with your health care provider. Birth control may not work properly while you are taking this medicine. Talk to your health care provider about using an extra method of birth control. What side effects may I notice from receiving this medicine? Side effects that you should report to your doctor or health care provider as soon as possible:  allergic reactions (skin rash, itching or hives; swelling of the face, lips, or tongue)  bloody or watery diarrhea  dark urine  infection (fever, chills, cough, sore throat, or pain)  kidney injury (trouble passing urine or change in the amount of urine)  redness, blistering, peeling, or loosening of the skin, including inside the mouth  seizures  thrush (white patches in the mouth or mouth sores)  trouble breathing  unusual bruising or bleeding  unusually weak or tired Side effects that usually do not require medical attention (report to your doctor or health care provider if they continue or are bothersome):  diarrhea  dizziness  headache  nausea, vomiting  unusual vaginal discharge, itching, or odor  upset stomach This list may not describe all possible side effects. Call your doctor for medical advice about side effects. You may report side effects to FDA at 1-800-FDA-1088. Where should I keep my medicine? Keep out of the reach of children and pets. Store at room temperature between 20 and 25 degrees C (68 and 77 degrees F). Throw away any unused drug after the expiration date. NOTE: This sheet is a summary. It may not cover all possible information. If you have questions about this medicine, talk to your doctor, pharmacist, or health care provider.  2021 Elsevier/Gold Standard (2019-12-02 09:45:03)

## 2020-02-22 NOTE — Telephone Encounter (Signed)
Copied from [place] [id number]. Topic: General - Other >> Feb 22, 2020  8:20 AM Leward Quan A wrote: Reason for CRM: Patient called in to inquire of Dr Army Melia about getting something for pressure in her head since Saturday 02/20/20 asking if she need to be seen or can she just get an Rx sent to the pharmacy. Please call  Ph# [phone]

## 2020-02-24 ENCOUNTER — Ambulatory Visit: Payer: BC Managed Care – PPO | Admitting: Internal Medicine

## 2020-03-01 DIAGNOSIS — Z90710 Acquired absence of both cervix and uterus: Secondary | ICD-10-CM | POA: Insufficient documentation

## 2020-03-03 ENCOUNTER — Other Ambulatory Visit: Payer: Self-pay

## 2020-03-03 ENCOUNTER — Encounter: Payer: Self-pay | Admitting: Internal Medicine

## 2020-03-03 DIAGNOSIS — B379 Candidiasis, unspecified: Secondary | ICD-10-CM

## 2020-03-03 MED ORDER — FLUCONAZOLE 150 MG PO TABS: 150.0000 mg | ORAL_TABLET | Freq: Once | ORAL | 0 refills | Status: AC

## 2020-03-09 ENCOUNTER — Other Ambulatory Visit: Payer: Self-pay

## 2020-03-09 ENCOUNTER — Ambulatory Visit: Payer: BC Managed Care – PPO | Admitting: Internal Medicine

## 2020-03-09 ENCOUNTER — Encounter: Payer: Self-pay | Admitting: Internal Medicine

## 2020-03-09 VITALS — BP 124/84 | HR 116 | Temp 99.1°F | Ht 66.0 in | Wt 224.0 lb

## 2020-03-09 DIAGNOSIS — R059 Cough, unspecified: Secondary | ICD-10-CM | POA: Diagnosis not present

## 2020-03-09 DIAGNOSIS — Z20822 Contact with and (suspected) exposure to covid-19: Secondary | ICD-10-CM

## 2020-03-09 MED ORDER — ALBUTEROL SULFATE HFA 108 (90 BASE) MCG/ACT IN AERS: 2.0000 | INHALATION_SPRAY | Freq: Four times a day (QID) | RESPIRATORY_TRACT | 0 refills | Status: DC | PRN

## 2020-03-09 MED ORDER — PROMETHAZINE-DM 6.25-15 MG/5ML PO SYRP: 5.0000 mL | ORAL_SOLUTION | Freq: Four times a day (QID) | ORAL | 0 refills | Status: DC | PRN

## 2020-03-09 NOTE — Progress Notes (Signed)
Date:  03/09/2020   Name:  Gabrielle Tucker   DOB:  05-14-1960   MRN:  [id number]   Chief Complaint: Cough (Cough and head congestion. Has not been tested for Covid. Started with scratchy throat Sunday and then the cough started. No production. Shortness of breathe, no loss or taste or smell. )  Cough This is a new problem. The current episode started in the past 7 days. The problem occurs every few minutes. The cough is non-productive. Associated symptoms include chills, a fever, headaches, nasal congestion, a sore throat and shortness of breath. Pertinent negatives include no chest pain, postnasal drip or wheezing.   Immunization History  Administered Date(s) Administered  . Influenza,inj,Quad PF,6+ Mos 10/30/2017, 01/07/2019  . Influenza-Unspecified 10/23/2014, 11/07/2015, 11/04/2019  . Moderna Sars-Covid-2 Vaccination 03/11/2019, 04/08/2019, 12/08/2019  . Tdap 07/18/2019    Lab Results  Component Value Date   CREATININE 0.72 03/18/2019   BUN 13 03/18/2019   NA 142 03/18/2019   K 4.5 03/18/2019   CL 104 03/18/2019   CO2 20 03/18/2019   Lab Results  Component Value Date   CHOL 152 07/09/2019   HDL 41 07/09/2019   LDLCALC 81 07/09/2019   TRIG 174 (H) 07/09/2019   CHOLHDL 3.7 07/09/2019   Lab Results  Component Value Date   TSH 0.95 10/07/2018   Lab Results  Component Value Date   HGBA1C 5.8 10/14/2019   Lab Results  Component Value Date   WBC 7.8 01/07/2019   HGB 13.8 01/07/2019   HCT 42.2 01/07/2019   MCV 92 01/07/2019   PLT 186 01/07/2019   Lab Results  Component Value Date   ALT 30 07/09/2019   AST 34 07/09/2019   ALKPHOS 153 (H) 07/09/2019   BILITOT 0.7 07/09/2019     Review of Systems  Constitutional: Positive for chills, fatigue and fever.  HENT: Positive for sore throat. Negative for congestion and postnasal drip.   Respiratory: Positive for cough and shortness of breath. Negative for chest tightness and wheezing.   Cardiovascular:  Negative for chest pain and palpitations.  Gastrointestinal: Negative for diarrhea and vomiting.  Neurological: Positive for headaches. Negative for dizziness and light-headedness.    Patient Active Problem List   Diagnosis Date Noted  . Status post hysterectomy 03/01/2020  . Type II diabetes mellitus with complication ([place]) 75/10/2583  . Mood disorder ([place]) 01/07/2019  . S/P total thyroidectomy 12/16/2017  . Multiple thyroid nodules 11/12/2017  . Hyperlipidemia, mild 08/03/2016  . Arthritis of knee, left 08/02/2016  . Back muscle spasm 07/03/2014  . H/O peptic ulcer 07/03/2014  . Migraine without aura and responsive to treatment 07/03/2014  . Muscle contraction headache 07/03/2014  . Idiopathic insomnia 07/03/2014  . Tachycardia 07/03/2014    Allergies  Allergen Reactions  . Morphine And Related Nausea And Vomiting  . Tape Hives and Rash    No [paper tape.  [facility] derm OK    Past Surgical History:  Procedure Laterality Date  . ABDOMINAL HYSTERECTOMY     ovaries remain  . ANTERIOR FUSION CERVICAL SPINE  2006  . BREAST REDUCTION SURGERY    . CHOLECYSTECTOMY  05/05/2019  . ESOPHAGOGASTRODUODENOSCOPY  2011   gastric ulcer  . LITHOTRIPSY  2012   with setnet  . LUMBAR DISC SURGERY    . NASAL SINUS SURGERY  2015  . partail knee replacement Bilateral   . REDUCTION MAMMAPLASTY Bilateral 2011  . THYROIDECTOMY Bilateral 12/16/2017   Procedure: THYROIDECTOMY;  Surgeon: Margaretha Sheffield, MD;  Location: [facility] ORS;  Service: ENT;  Laterality: Bilateral;    Social History   Tobacco Use  . Smoking status: Never Smoker  . Smokeless tobacco: Never Used  Vaping Use  . Vaping Use: Never used  Substance Use Topics  . Alcohol use: Yes    Alcohol/week: 0.0 - 1.0 standard drinks    Comment: on occassion  . Drug use: Not Currently    Types: Marijuana     Medication list has been reviewed and updated.  Current Meds  Medication Sig  . amoxicillin-clavulanate (AUGMENTIN) 875-125 MG  tablet Take 1 tablet by mouth 2 (two) times daily.  Marland Kitchen atorvastatin (LIPITOR) 10 MG tablet Take 1 tablet (10 mg total) by mouth daily.  Marland Kitchen escitalopram (LEXAPRO) 20 MG tablet TAKE (1) TABLET BY MOUTH EVERY DAY  . Fexofenadine HCl (MUCINEX ALLERGY PO) Take by mouth as needed.  . fluticasone (FLONASE) 50 MCG/ACT nasal spray Place 2 sprays into both nostrils daily.  . hydrOXYzine (ATARAX/VISTARIL) 25 MG tablet TK 1 TO 2 TS PO QHS  . ibuprofen (ADVIL,MOTRIN) 600 MG tablet Take 1 tablet (600 mg total) by mouth every 6 (six) hours as needed.  . Levothyroxine Sodium 150 MCG/ML SOLN Take 150 mcg by mouth daily before breakfast.   . metoprolol succinate (TOPROL-XL) 50 MG 24 hr tablet Take 1 tablet (50 mg total) by mouth daily. Take with or immediately following a meal.  . pantoprazole (PROTONIX) 40 MG tablet TAKE 1 TABLET BY MOUTH TWICE DAILY  . Semaglutide,0.25 or 0.5MG /DOS, 2 MG/1.5ML SOPN Inject 0.5 mg into the skin once a week.  Marland Kitchen tiZANidine (ZANAFLEX) 4 MG tablet TAKE (1) TABLET BY MOUTH DAILY AS NEEDEDFOR MUSCLE SPASMS.  . zaleplon (SONATA) 10 MG capsule TAKE (1) CAPSULE BY MOUTH EVERY NIGHT AT BEDTIME AS NEEDED FOR SLEEP    PHQ 2/9 Scores 03/09/2020 12/11/2019 11/20/2019 07/09/2019  PHQ - 2 Score 0 0 0 0  PHQ- 9 Score 0 0 0 0    GAD 7 : Generalized Anxiety Score 03/09/2020 12/11/2019 11/20/2019 07/09/2019  Nervous, Anxious, on Edge 0 0 0 0  Control/stop worrying 0 0 0 0  Worry too much - different things 0 0 0 0  Trouble relaxing 0 0 0 0  Restless 0 0 0 0  Easily annoyed or irritable 0 0 0 0  Afraid - awful might happen 0 0 0 0  Total GAD 7 Score 0 0 0 0  Anxiety Difficulty Not difficult at all - Not difficult at all Not difficult at all    BP Readings from Last 3 Encounters:  03/09/20 124/84  12/11/19 126/76  11/20/19 122/72    Physical Exam Constitutional:      Appearance: She is ill-appearing.  HENT:     Right Ear: Tympanic membrane is not erythematous or retracted.     Left  Ear: Tympanic membrane is not erythematous or retracted.     Nose:     Right Sinus: No maxillary sinus tenderness or frontal sinus tenderness.     Left Sinus: No maxillary sinus tenderness or frontal sinus tenderness.  Cardiovascular:     Rate and Rhythm: Normal rate and regular rhythm.     Pulses: Normal pulses.     Heart sounds: No murmur heard.   Pulmonary:     Effort: Pulmonary effort is normal.     Breath sounds: Normal breath sounds. No decreased breath sounds or wheezing.  Musculoskeletal:     Cervical back: Normal range of motion and neck  supple.  Lymphadenopathy:     Cervical: No cervical adenopathy.  Neurological:     Mental Status: She is alert.     Wt Readings from Last 3 Encounters:  03/09/20 224 lb (101.6 kg)  12/11/19 224 lb (101.6 kg)  11/20/19 223 lb (101.2 kg)    BP 124/84   Pulse (!) 116   Temp 99.1 F (37.3 C) (Oral)   Ht 5\' 6"  (1.676 m)   Wt 224 lb (101.6 kg)   SpO2 96%   BMI 36.15 kg/m   Assessment and Plan: 1. Suspected COVID-19 virus infection Quarantine for at least 5 days until fever resolved and symptoms improved Continue tylenol for fever and headache; fluids - Novel Coronavirus, NAA ([facility])  2. Cough - albuterol (VENTOLIN HFA) 108 (90 Base) MCG/ACT inhaler; Inhale 2 puffs into the lungs every 6 (six) hours as needed for wheezing or shortness of breath.  Dispense: 1 each; Refill: 0 - promethazine-dextromethorphan (PROMETHAZINE-DM) 6.25-15 MG/5ML syrup; Take 5 mLs by mouth 4 (four) times daily as needed for cough.  Dispense: 180 mL; Refill: 0   Partially dictated using Editor, commissioning. Any errors are unintentional.  Halina Maidens, MD [facility]  03/09/2020

## 2020-03-10 LAB — SARS-COV-2, NAA 2 DAY TAT

## 2020-03-10 LAB — NOVEL CORONAVIRUS, NAA: SARS-CoV-2, NAA: NOT DETECTED

## 2020-03-11 ENCOUNTER — Encounter: Payer: Self-pay | Admitting: Internal Medicine

## 2020-03-17 ENCOUNTER — Other Ambulatory Visit: Payer: Self-pay | Admitting: Internal Medicine

## 2020-03-17 DIAGNOSIS — F39 Unspecified mood [affective] disorder: Secondary | ICD-10-CM

## 2020-03-29 ENCOUNTER — Other Ambulatory Visit: Payer: Self-pay | Admitting: Internal Medicine

## 2020-03-29 DIAGNOSIS — Z8711 Personal history of peptic ulcer disease: Secondary | ICD-10-CM

## 2020-04-05 DIAGNOSIS — E119 Type 2 diabetes mellitus without complications: Secondary | ICD-10-CM | POA: Diagnosis not present

## 2020-04-05 DIAGNOSIS — E890 Postprocedural hypothyroidism: Secondary | ICD-10-CM | POA: Diagnosis not present

## 2020-04-05 LAB — BASIC METABOLIC PANEL
BUN: 8 (ref 4–21)
CO2: 31 — AB (ref 13–22)
Chloride: 105 (ref 99–108)
Creatinine: 0.8 (ref 0.5–1.1)
Glucose: 105
Potassium: 4.8 (ref 3.4–5.3)
Sodium: 141 (ref 137–147)

## 2020-04-05 LAB — CBC AND DIFFERENTIAL
HCT: 41 (ref 36–46)
Hemoglobin: 13.1 (ref 12.0–16.0)
Platelets: 207 (ref 150–399)
WBC: 9.6

## 2020-04-05 LAB — MICROALBUMIN, URINE: Microalb, Ur: 8.4

## 2020-04-05 LAB — COMPREHENSIVE METABOLIC PANEL: Calcium: 9.2 (ref 8.7–10.7)

## 2020-04-05 LAB — CBC: RBC: 4.55 (ref 3.87–5.11)

## 2020-04-05 LAB — TSH: TSH: 0.13 — AB (ref 0.41–5.90)

## 2020-04-05 LAB — HEMOGLOBIN A1C: Hemoglobin A1C: 5.9

## 2020-04-08 ENCOUNTER — Other Ambulatory Visit: Payer: Self-pay | Admitting: Internal Medicine

## 2020-04-08 DIAGNOSIS — R000 Tachycardia, unspecified: Secondary | ICD-10-CM

## 2020-04-12 DIAGNOSIS — E119 Type 2 diabetes mellitus without complications: Secondary | ICD-10-CM | POA: Diagnosis not present

## 2020-04-12 DIAGNOSIS — E890 Postprocedural hypothyroidism: Secondary | ICD-10-CM | POA: Diagnosis not present

## 2020-04-12 DIAGNOSIS — Z85850 Personal history of malignant neoplasm of thyroid: Secondary | ICD-10-CM | POA: Diagnosis not present

## 2020-05-05 ENCOUNTER — Other Ambulatory Visit: Payer: Self-pay | Admitting: Internal Medicine

## 2020-05-05 DIAGNOSIS — M542 Cervicalgia: Secondary | ICD-10-CM

## 2020-05-05 NOTE — Telephone Encounter (Signed)
Requested medication (s) are due for refill today:  yes  Requested medication (s) are on the active medication list: yes  Last refill:  02/15/2020  Future visit scheduled: yes  Notes to clinic: this refill cannot be delegated    Requested Prescriptions  Pending Prescriptions Disp Refills   tiZANidine (ZANAFLEX) 4 MG tablet [Pharmacy Med Name: TIZANIDINE HCL 4 MG TAB] 90 tablet 1    Sig: TAKE (1) TABLET BY MOUTH DAILY AS NEEDEDFOR MUSCLE SPASMS.      Not Delegated - Cardiovascular:  Alpha-2 Agonists - tizanidine Failed - 05/05/2020  8:15 AM      Failed - This refill cannot be delegated      Passed - Valid encounter within last 6 months    Recent Outpatient Visits           1 month ago Suspected COVID-19 virus infection   [facility] Glean Hess, MD   4 months ago Acute maxillary sinusitis, recurrence not specified   [facility] Glean Hess, MD   5 months ago Mid back pain on left side   [facility] Glean Hess, MD   7 months ago Acute non-recurrent maxillary sinusitis   [facility] Glean Hess, MD   10 months ago Hyperlipidemia, mild   [facility] Glean Hess, MD       Future Appointments             In 1 month Army Melia Jesse Sans, MD [facility], [facility]

## 2020-05-11 ENCOUNTER — Encounter: Payer: Self-pay | Admitting: Internal Medicine

## 2020-05-12 ENCOUNTER — Ambulatory Visit: Payer: Self-pay | Admitting: *Deleted

## 2020-05-12 NOTE — Telephone Encounter (Signed)
Mild ULQ abdominal pain and cramping for 1 week.Patient states she noticed small amount of rectal bleeding/mucus last Friday- has not seen since. Patient is presently out of town and she is requesting a referral to GI for this. Patient states she was told by GI she may be able to be seen sooner if she had referral from PCP.  Patient is presently out of town- to return Sunday. Advised if her symptoms get worse in anyway to go to UC there. Unable to schedule patient appointment in the office- note sent for review.  Reason for Disposition . Age > 60 years  Answer Assessment - Initial Assessment Questions 1. LOCATION: "Where does it hurt?"      LUQ mostly 2. RADIATION: "Does the pain shoot anywhere else?" (e.g., chest, back)     no 3. ONSET: "When did the pain begin?" (e.g., minutes, hours or days ago)      1 week 4. SUDDEN: "Gradual or sudden onset?"     unsure 5. PATTERN "Does the pain come and go, or is it constant?"    - If constant: "Is it getting better, staying the same, or worsening?"      (Note: Constant means the pain never goes away completely; most serious pain is constant and it progresses)     - If intermittent: "How long does it last?" "Do you have pain now?"     (Note: Intermittent means the pain goes away completely between bouts)     Constant, same 6. SEVERITY: "How bad is the pain?"  (e.g., Scale 1-10; mild, moderate, or severe)   - MILD (1-3): doesn't interfere with normal activities, abdomen soft and not tender to touch    - MODERATE (4-7): interferes with normal activities or awakens from sleep, tender to touch    - SEVERE (8-10): excruciating pain, doubled over, unable to do any normal activities      Mild- dull ache 7. RECURRENT SYMPTOM: "Have you ever had this type of stomach pain before?" If Yes, ask: "When was the last time?" and "What happened that time?"      no 8. CAUSE: "What do you think is causing the stomach pain?"     unsure 9. RELIEVING/AGGRAVATING  FACTORS: "What makes it better or worse?" (e.g., movement, antacids, bowel movement)     no 10. OTHER SYMPTOMS: "Has there been any vomiting, diarrhea, constipation, or urine problems?"       Noticed mucus/blood seperate from stool on Friday 11. PREGNANCY: "Is there any chance you are pregnant?" "When was your last menstrual period?"       n/a  Protocols used: ABDOMINAL PAIN - [facility]

## 2020-05-13 ENCOUNTER — Telehealth: Payer: Self-pay

## 2020-05-13 NOTE — Telephone Encounter (Signed)
Spoke to pt told her to go to [facility] or ED for abdominal pain/ rectal bleeding. Pt verbalized understanding.  KP

## 2020-05-13 NOTE — Telephone Encounter (Unsigned)
Copied from [place] [id number]. Topic: General - Other >> May 13, 2020 10:53 AM Gabrielle Tucker wrote: Reason for CRM: Pt stated she spoke with a nurse yesterday and requested a call back in regards to an appt but she has not heard from anyone. Pt requests call back. Cb# [phone]

## 2020-05-13 NOTE — Telephone Encounter (Signed)
Called pt in regards to her telephone call from 05/12/2020. Told pt to go to UC or ED for abdominal pain and rectal bleeding. Pt verbalized understanding.  KP

## 2020-05-17 DIAGNOSIS — K253 Acute gastric ulcer without hemorrhage or perforation: Secondary | ICD-10-CM | POA: Diagnosis not present

## 2020-05-20 ENCOUNTER — Telehealth: Payer: Self-pay | Admitting: Internal Medicine

## 2020-05-20 NOTE — Telephone Encounter (Signed)
Pt is calling and did go to [facility] urgent care for abd pain, rectal bleed on 05-17-2020. [facility] is referring the pt to see GI specialist . Pt has made her own appt to see GI specialist 07-19-2020 at [facility]. Pt is calling and has been having nausea since 05-18-2020 also pt is still have abd pain. [facility] [place] phone number [phone]

## 2020-05-20 NOTE — Telephone Encounter (Signed)
I can not justify a stat referral if I have not evaluated the patient.

## 2020-05-20 NOTE — Telephone Encounter (Signed)
See other encounter.

## 2020-05-20 NOTE — Telephone Encounter (Signed)
Please advise for symptoms. 

## 2020-05-20 NOTE — Telephone Encounter (Signed)
Please advise if STAT referral to GI can be placed for abdominal pain and rectal bleeding.  Could maybe try [facility].  Patient scheduled with [facility] 07/19/2020 (first available).

## 2020-05-20 NOTE — Telephone Encounter (Signed)
Sent patient a MyChart message to see if she could be seen sooner by GI for abdominal pain. Please advise if anything further.

## 2020-05-20 NOTE — Telephone Encounter (Signed)
Replied to patients my chart message and let her know that she needs to make an appt with Dr Army Melia before she can refer her anywhere.

## 2020-05-24 ENCOUNTER — Other Ambulatory Visit: Payer: Self-pay

## 2020-05-24 ENCOUNTER — Ambulatory Visit: Payer: BC Managed Care – PPO | Admitting: Internal Medicine

## 2020-05-24 ENCOUNTER — Encounter: Payer: Self-pay | Admitting: Internal Medicine

## 2020-05-24 VITALS — BP 120/68 | HR 76 | Temp 97.8°F | Ht 66.0 in | Wt 223.0 lb

## 2020-05-24 DIAGNOSIS — E118 Type 2 diabetes mellitus with unspecified complications: Secondary | ICD-10-CM

## 2020-05-24 DIAGNOSIS — Z8711 Personal history of peptic ulcer disease: Secondary | ICD-10-CM | POA: Diagnosis not present

## 2020-05-24 DIAGNOSIS — N3000 Acute cystitis without hematuria: Secondary | ICD-10-CM | POA: Diagnosis not present

## 2020-05-24 DIAGNOSIS — Z8719 Personal history of other diseases of the digestive system: Secondary | ICD-10-CM | POA: Diagnosis not present

## 2020-05-24 LAB — POC URINALYSIS WITH MICROSCOPIC (NON AUTO)MANUAL RESULT
Crystals: 0
Epithelial cells, urine per micros: 0
Glucose, UA: NEGATIVE
Ketones, UA: NEGATIVE
Mucus, UA: 0
Nitrite, UA: NEGATIVE
Protein, UA: POSITIVE — AB
RBC: 0 M/uL — AB (ref 4.04–5.48)
Spec Grav, UA: >=1.03 — AB (ref 1.010–1.025)
Urobilinogen, UA: 0.2 E.U./dL
WBC Casts, UA: 5
pH, UA: 6 (ref 5.0–8.0)

## 2020-05-24 MED ORDER — SULFAMETHOXAZOLE-TRIMETHOPRIM 800-160 MG PO TABS: 1.0000 | ORAL_TABLET | Freq: Two times a day (BID) | ORAL | 0 refills | Status: AC

## 2020-05-24 MED ORDER — SUCRALFATE 1 G PO TABS: 1.0000 g | ORAL_TABLET | Freq: Three times a day (TID) | ORAL | 1 refills | Status: DC

## 2020-05-24 NOTE — Addendum Note (Signed)
Addended by: Glean Hess on: 05/24/2020 10:17 AM   Modules accepted: Orders

## 2020-05-24 NOTE — Progress Notes (Signed)
Date:  05/24/2020   Name:  Gabrielle Tucker   DOB:  [birth year]   MRN:  [id number]   Chief Complaint: Abdominal Pain (Rectal Exam. Abdominal Pain. GI referral. Only had rectal bleeding twice. Abdominal pain is constant. ), Urinary Tract Infection (Burning while urination, painful, and not much coming out when going to the bathroom. Started last week.), and Prediabetes (See's endo Dr. Gabriel Carina at [facility]. She did a A1C last month. It was 5.9) Had upper mid abd pain on 05/06/20, went to the bathroom had bloody mucus when she tried to have a BM. Had bloody mucus with blood clot twice. Still having abd pain.  Seen in [facility]. Prescribed carafate suspension but unable to get it -?PA needed.  Abdominal Pain This is a new problem. The current episode started 1 to 4 weeks ago. The problem occurs constantly. The problem has been unchanged. The pain is located in the epigastric region. The pain is moderate. The quality of the pain is aching, burning and a sensation of fullness. The abdominal pain does not radiate. Associated symptoms include dysuria and hematochezia. Pertinent negatives include no fever, headaches or nausea. She has tried proton pump inhibitors for the symptoms. The treatment provided no relief. Prior workup: colonoscopy 2011.  Rectal Bleeding  The current episode started more than 2 weeks ago. The problem has been resolved. The patient is experiencing no pain. The stool is described as soft. Associated symptoms include abdominal pain and hemorrhoids (hx of hemorroid surgery). Pertinent negatives include no fever, no nausea, no rectal pain, no chest pain and no headaches.  Urinary Tract Infection  This is a new problem. The current episode started in the past 7 days. The problem has been unchanged. The quality of the pain is described as burning. The pain is mild. There has been no fever. Pertinent negatives include no chills or nausea. Associated symptoms comments: Started after GI virus with diarrhea  last week.    Lab Results  Component Value Date   CREATININE 0.8 04/05/2020   BUN 8 04/05/2020   NA 141 04/05/2020   K 4.8 04/05/2020   CL 105 04/05/2020   CO2 31 (A) 04/05/2020   Lab Results  Component Value Date   CHOL 152 07/09/2019   HDL 41 07/09/2019   LDLCALC 81 07/09/2019   TRIG 174 (H) 07/09/2019   CHOLHDL 3.7 07/09/2019   Lab Results  Component Value Date   TSH 0.13 (A) 04/05/2020   Lab Results  Component Value Date   HGBA1C 5.9 04/05/2020   Lab Results  Component Value Date   WBC 9.6 04/05/2020   HGB 13.1 04/05/2020   HCT 41 04/05/2020   MCV 92 01/07/2019   PLT 207 04/05/2020   Lab Results  Component Value Date   ALT 30 07/09/2019   AST 34 07/09/2019   ALKPHOS 153 (H) 07/09/2019   BILITOT 0.7 07/09/2019     Review of Systems  Constitutional: Negative for chills, fatigue and fever.  HENT: Negative for trouble swallowing.   Respiratory: Negative for chest tightness and shortness of breath.   Cardiovascular: Negative for chest pain.  Gastrointestinal: Positive for abdominal pain, hematochezia and hemorrhoids (hx of hemorroid surgery). Negative for blood in stool, nausea and rectal pain.  Genitourinary: Positive for dysuria.  Neurological: Negative for dizziness and headaches.    Patient Active Problem List   Diagnosis Date Noted  . Status post hysterectomy 03/01/2020  . Type II diabetes mellitus with complication ([place]) 32/67/1245  .  Mood disorder ([place]) 01/07/2019  . S/P total thyroidectomy 12/16/2017  . Multiple thyroid nodules 11/12/2017  . Hyperlipidemia, mild 08/03/2016  . Arthritis of knee, left 08/02/2016  . Back muscle spasm 07/03/2014  . H/O peptic ulcer 07/03/2014  . Migraine without aura and responsive to treatment 07/03/2014  . Muscle contraction headache 07/03/2014  . Idiopathic insomnia 07/03/2014  . Tachycardia 07/03/2014    Allergies  Allergen Reactions  . Morphine And Related Nausea And Vomiting  . Tape Hives and Rash     No [paper tape.  Tega derm OK    Past Surgical History:  Procedure Laterality Date  . ABDOMINAL HYSTERECTOMY     ovaries remain  . ANTERIOR FUSION CERVICAL SPINE  2006  . BREAST REDUCTION SURGERY    . CHOLECYSTECTOMY  05/05/2019  . ESOPHAGOGASTRODUODENOSCOPY  2011   gastric ulcer  . LITHOTRIPSY  2012   with setnet  . LUMBAR DISC SURGERY    . NASAL SINUS SURGERY  2015  . partail knee replacement Bilateral   . REDUCTION MAMMAPLASTY Bilateral 2011  . THYROIDECTOMY Bilateral 12/16/2017   Procedure: THYROIDECTOMY;  Surgeon: Margaretha Sheffield, MD;  Location: [facility] ORS;  Service: ENT;  Laterality: Bilateral;    Social History   Tobacco Use  . Smoking status: Never Smoker  . Smokeless tobacco: Never Used  Vaping Use  . Vaping Use: Never used  Substance Use Topics  . Alcohol use: Yes    Alcohol/week: 0.0 - 1.0 standard drinks    Comment: on occassion  . Drug use: Not Currently    Types: Marijuana     Medication list has been reviewed and updated.  Current Meds  Medication Sig  . albuterol (VENTOLIN HFA) 108 (90 Base) MCG/ACT inhaler Inhale 2 puffs into the lungs every 6 (six) hours as needed for wheezing or shortness of breath.  Marland Kitchen atorvastatin (LIPITOR) 10 MG tablet Take 1 tablet (10 mg total) by mouth daily.  Marland Kitchen escitalopram (LEXAPRO) 20 MG tablet TAKE (1) TABLET BY MOUTH EVERY DAY  . Fexofenadine HCl (MUCINEX ALLERGY PO) Take by mouth as needed.  . fluticasone (FLONASE) 50 MCG/ACT nasal spray Place 2 sprays into both nostrils daily.  . hydrOXYzine (ATARAX/VISTARIL) 25 MG tablet TK 1 TO 2 TS PO QHS  . ibuprofen (ADVIL,MOTRIN) 600 MG tablet Take 1 tablet (600 mg total) by mouth every 6 (six) hours as needed.  . Levothyroxine Sodium 150 MCG/ML SOLN Take 150 mcg by mouth daily before breakfast.   . metoprolol succinate (TOPROL-XL) 50 MG 24 hr tablet Take 1 tablet (50 mg total) by mouth daily. Take with or immediately following a meal.  . pantoprazole (PROTONIX) 40 MG tablet TAKE  (1) TABLET BY MOUTH TWICE DAILY  . promethazine-dextromethorphan (PROMETHAZINE-DM) 6.25-15 MG/5ML syrup Take 5 mLs by mouth 4 (four) times daily as needed for cough.  . Semaglutide,0.25 or 0.5MG /DOS, 2 MG/1.5ML SOPN Inject 0.5 mg into the skin once a week.  . sucralfate (CARAFATE) 1 g tablet Take 1 tablet (1 g total) by mouth 4 (four) times daily -  with meals and at bedtime.  . sulfamethoxazole-trimethoprim (BACTRIM DS) 800-160 MG tablet Take 1 tablet by mouth 2 (two) times daily for 5 days.  Marland Kitchen tiZANidine (ZANAFLEX) 4 MG tablet TAKE (1) TABLET BY MOUTH DAILY AS NEEDEDFOR MUSCLE SPASMS.  . zaleplon (SONATA) 10 MG capsule TAKE (1) CAPSULE BY MOUTH EVERY NIGHT AT BEDTIME AS NEEDED FOR SLEEP    PHQ 2/9 Scores 05/24/2020 03/09/2020 12/11/2019 11/20/2019  PHQ - 2 Score  0 0 0 0  PHQ- 9 Score 0 0 0 0    GAD 7 : Generalized Anxiety Score 05/24/2020 03/09/2020 12/11/2019 11/20/2019  Nervous, Anxious, on Edge 0 0 0 0  Control/stop worrying 0 0 0 0  Worry too much - different things 0 0 0 0  Trouble relaxing 0 0 0 0  Restless 0 0 0 0  Easily annoyed or irritable 0 0 0 0  Afraid - awful might happen 0 0 0 0  Total GAD 7 Score 0 0 0 0  Anxiety Difficulty Not difficult at all Not difficult at all - Not difficult at all    BP Readings from Last 3 Encounters:  05/24/20 120/68  03/09/20 124/84  12/11/19 126/76    Physical Exam Vitals and nursing note reviewed.  Constitutional:      General: She is not in acute distress.    Appearance: She is well-developed. She is not ill-appearing.  HENT:     Head: Normocephalic and atraumatic.  Pulmonary:     Effort: Pulmonary effort is normal. No respiratory distress.  Abdominal:     General: Abdomen is protuberant. Bowel sounds are decreased.     Palpations: Abdomen is soft.     Tenderness: There is abdominal tenderness in the right upper quadrant, epigastric area and left upper quadrant. There is no right CVA tenderness, left CVA tenderness, guarding or  rebound.  Genitourinary:    Rectum: Normal. Guaiac result negative. No mass, tenderness, anal fissure, external hemorrhoid or internal hemorrhoid. Normal anal tone.  Skin:    General: Skin is warm and dry.     Findings: No rash.  Neurological:     Mental Status: She is alert and oriented to person, place, and time.  Psychiatric:        Mood and Affect: Mood normal.        Behavior: Behavior normal.     Wt Readings from Last 3 Encounters:  05/24/20 223 lb (101.2 kg)  03/09/20 224 lb (101.6 kg)  12/11/19 224 lb (101.6 kg)    BP 120/68   Pulse 76   Temp 97.8 F (36.6 C) (Oral)   Ht 5\' 6"  (1.676 m)   Wt 223 lb (101.2 kg)   SpO2 97%   BMI 35.99 kg/m   Assessment and Plan: 1. H/O peptic ulcer Continue Protonix bid Begin Carafate Refer for urgent GI evaluation - - sucralfate (CARAFATE) 1 g tablet; Take 1 tablet (1 g total) by mouth 4 (four) times daily -  with meals and at bedtime.  Dispense: 90 tablet; Refill: 1 - Ambulatory referral to Gastroenterology  2. History of rectal bleeding No further bleeding - suspect fissure that has now healed - Ambulatory referral to Gastroenterology  3. Acute cystitis without hematuria Increase fluids - sulfamethoxazole-trimethoprim (BACTRIM DS) 800-160 MG tablet; Take 1 tablet by mouth 2 (two) times daily for 5 days.  Dispense: 10 tablet; Refill: 0  4. Type II diabetes mellitus with complication (HCC) Followed by Dr. Gabriel Carina Last A1C was excellent   Partially dictated using [employer]. Any errors are unintentional.  Halina Maidens, MD [facility]  05/24/2020

## 2020-05-24 NOTE — Patient Instructions (Signed)
Dissolve carafate tablet in 3 oz of water and drink three times per day.

## 2020-05-31 ENCOUNTER — Other Ambulatory Visit: Payer: Self-pay | Admitting: Internal Medicine

## 2020-05-31 DIAGNOSIS — F5101 Primary insomnia: Secondary | ICD-10-CM

## 2020-06-01 DIAGNOSIS — R1013 Epigastric pain: Secondary | ICD-10-CM | POA: Diagnosis not present

## 2020-06-01 DIAGNOSIS — K625 Hemorrhage of anus and rectum: Secondary | ICD-10-CM | POA: Diagnosis not present

## 2020-06-01 DIAGNOSIS — K219 Gastro-esophageal reflux disease without esophagitis: Secondary | ICD-10-CM | POA: Diagnosis not present

## 2020-06-05 DIAGNOSIS — Z20822 Contact with and (suspected) exposure to covid-19: Secondary | ICD-10-CM | POA: Diagnosis not present

## 2020-06-05 DIAGNOSIS — G992 Myelopathy in diseases classified elsewhere: Secondary | ICD-10-CM | POA: Diagnosis not present

## 2020-06-05 DIAGNOSIS — M5126 Other intervertebral disc displacement, lumbar region: Secondary | ICD-10-CM | POA: Diagnosis not present

## 2020-06-05 DIAGNOSIS — G959 Disease of spinal cord, unspecified: Secondary | ICD-10-CM | POA: Diagnosis not present

## 2020-06-05 DIAGNOSIS — E890 Postprocedural hypothyroidism: Secondary | ICD-10-CM | POA: Diagnosis not present

## 2020-06-05 DIAGNOSIS — K219 Gastro-esophageal reflux disease without esophagitis: Secondary | ICD-10-CM | POA: Diagnosis not present

## 2020-06-05 DIAGNOSIS — G039 Meningitis, unspecified: Secondary | ICD-10-CM | POA: Diagnosis not present

## 2020-06-05 DIAGNOSIS — R937 Abnormal findings on diagnostic imaging of other parts of musculoskeletal system: Secondary | ICD-10-CM | POA: Diagnosis not present

## 2020-06-05 DIAGNOSIS — Z0389 Encounter for observation for other suspected diseases and conditions ruled out: Secondary | ICD-10-CM | POA: Diagnosis not present

## 2020-06-05 DIAGNOSIS — Z85850 Personal history of malignant neoplasm of thyroid: Secondary | ICD-10-CM | POA: Diagnosis not present

## 2020-06-05 DIAGNOSIS — F329 Major depressive disorder, single episode, unspecified: Secondary | ICD-10-CM | POA: Diagnosis not present

## 2020-06-05 DIAGNOSIS — M4802 Spinal stenosis, cervical region: Secondary | ICD-10-CM | POA: Diagnosis not present

## 2020-06-05 DIAGNOSIS — Z20828 Contact with and (suspected) exposure to other viral communicable diseases: Secondary | ICD-10-CM | POA: Diagnosis not present

## 2020-06-05 DIAGNOSIS — I10 Essential (primary) hypertension: Secondary | ICD-10-CM | POA: Diagnosis not present

## 2020-06-05 DIAGNOSIS — R200 Anesthesia of skin: Secondary | ICD-10-CM | POA: Diagnosis not present

## 2020-06-05 DIAGNOSIS — R202 Paresthesia of skin: Secondary | ICD-10-CM | POA: Diagnosis not present

## 2020-06-05 DIAGNOSIS — R32 Unspecified urinary incontinence: Secondary | ICD-10-CM | POA: Diagnosis not present

## 2020-06-05 DIAGNOSIS — A609 Anogenital herpesviral infection, unspecified: Secondary | ICD-10-CM | POA: Diagnosis not present

## 2020-06-05 DIAGNOSIS — M5410 Radiculopathy, site unspecified: Secondary | ICD-10-CM | POA: Diagnosis not present

## 2020-06-05 DIAGNOSIS — A6000 Herpesviral infection of urogenital system, unspecified: Secondary | ICD-10-CM | POA: Diagnosis not present

## 2020-06-05 DIAGNOSIS — K279 Peptic ulcer, site unspecified, unspecified as acute or chronic, without hemorrhage or perforation: Secondary | ICD-10-CM | POA: Diagnosis not present

## 2020-06-05 DIAGNOSIS — R836 Abnormal cytological findings in cerebrospinal fluid: Secondary | ICD-10-CM | POA: Diagnosis not present

## 2020-06-05 DIAGNOSIS — E119 Type 2 diabetes mellitus without complications: Secondary | ICD-10-CM | POA: Diagnosis not present

## 2020-06-05 DIAGNOSIS — G9589 Other specified diseases of spinal cord: Secondary | ICD-10-CM | POA: Diagnosis not present

## 2020-06-05 DIAGNOSIS — B2702 Gammaherpesviral mononucleosis with meningitis: Secondary | ICD-10-CM | POA: Diagnosis not present

## 2020-06-05 DIAGNOSIS — R159 Full incontinence of feces: Secondary | ICD-10-CM | POA: Diagnosis not present

## 2020-06-05 DIAGNOSIS — Z923 Personal history of irradiation: Secondary | ICD-10-CM | POA: Diagnosis not present

## 2020-06-05 DIAGNOSIS — G0491 Myelitis, unspecified: Secondary | ICD-10-CM | POA: Diagnosis not present

## 2020-06-05 DIAGNOSIS — G0489 Other myelitis: Secondary | ICD-10-CM | POA: Diagnosis not present

## 2020-06-07 IMAGING — US US THYROID
1 series · 12 of 25 positions shown · non-contrast
Comparison: CT scan of the neck 11/07/2017

CLINICAL DATA: Incidental on CT. 56-year-old female with thyroid
nodule seen on recent CT scan

EXAM:
THYROID ULTRASOUND
TECHNIQUE: Ultrasound examination of the thyroid gland and adjacent soft
tissues was performed.

[Series 1: us thyroid · 0.07mm/px · 12 of 69 slices shown]
[im 3/69]
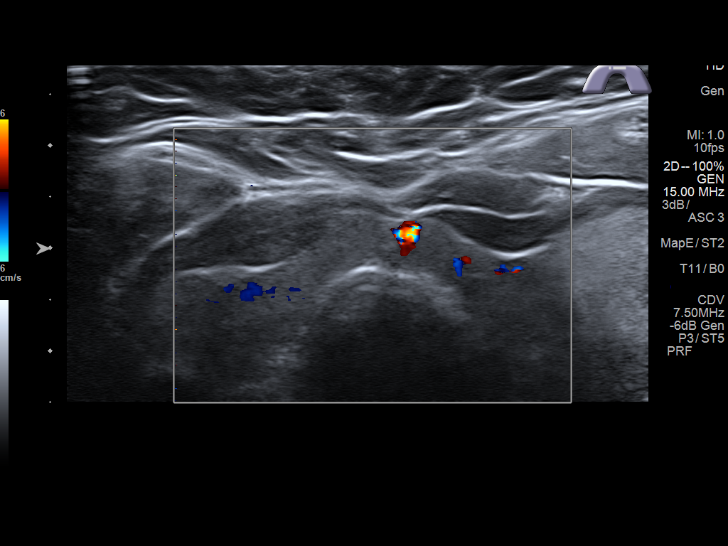
[im 9/69]
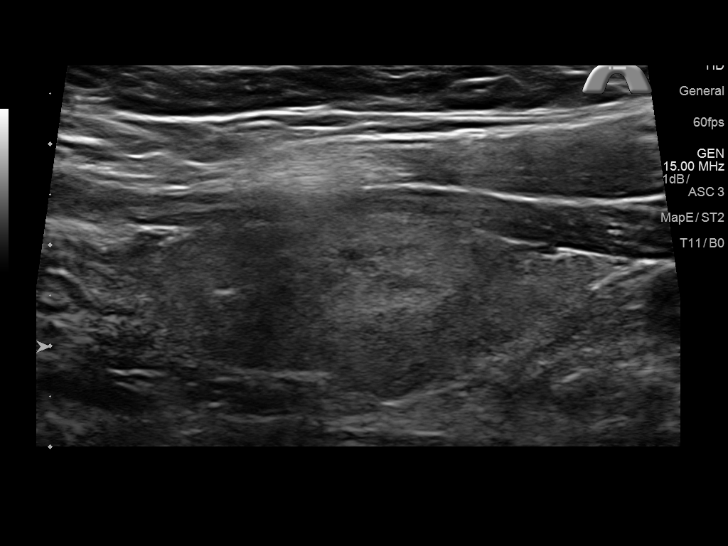
[im 15/69]
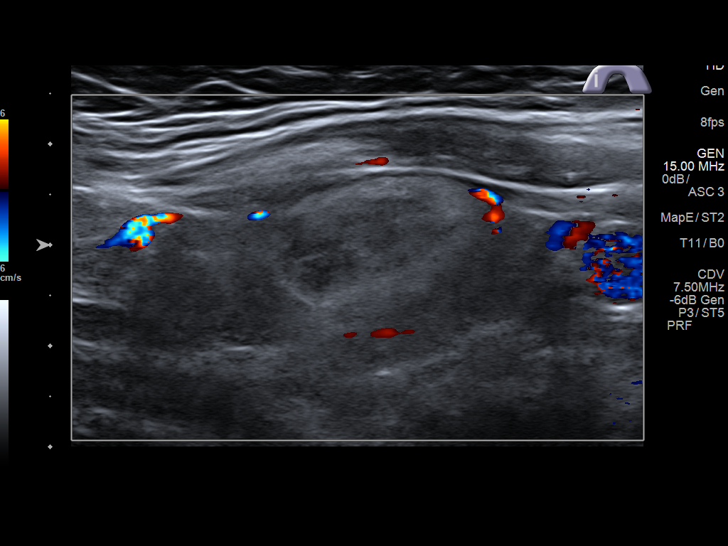
[im 20/69]
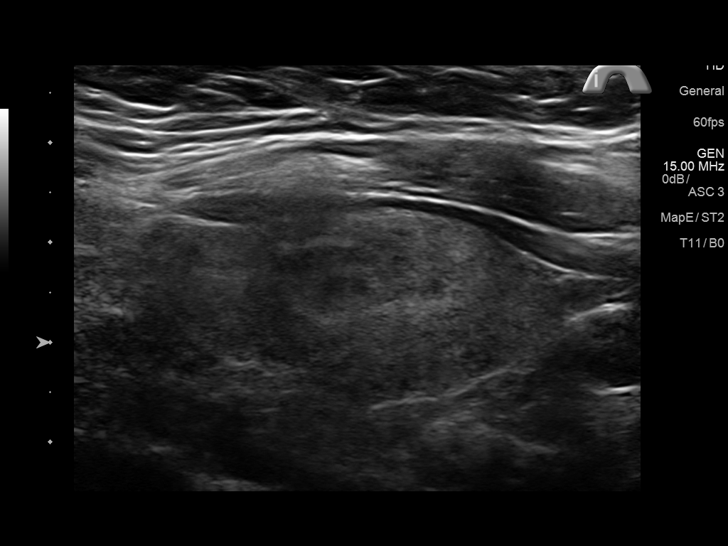
[im 26/69]
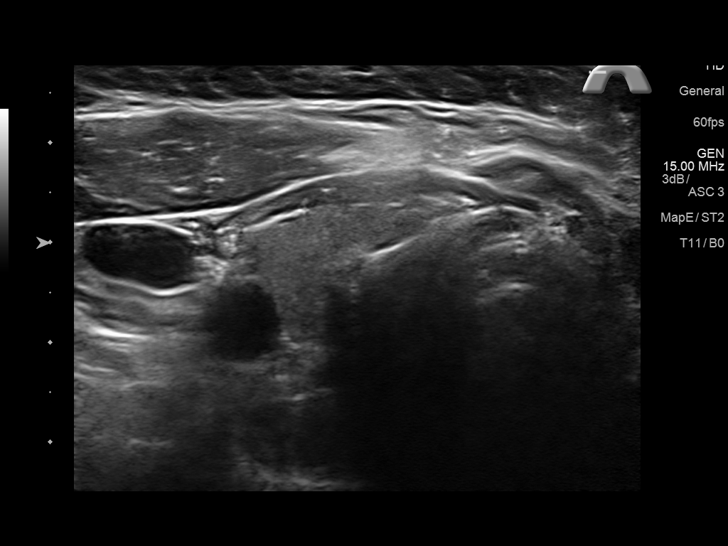
[im 32/69]
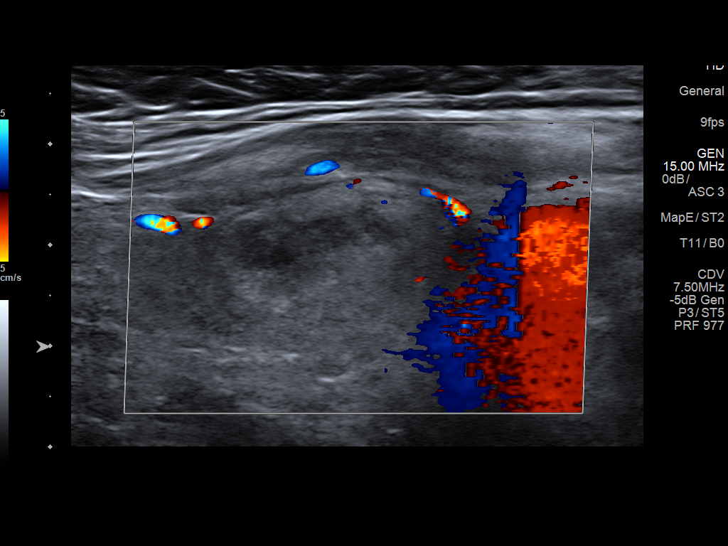
[im 37/69]
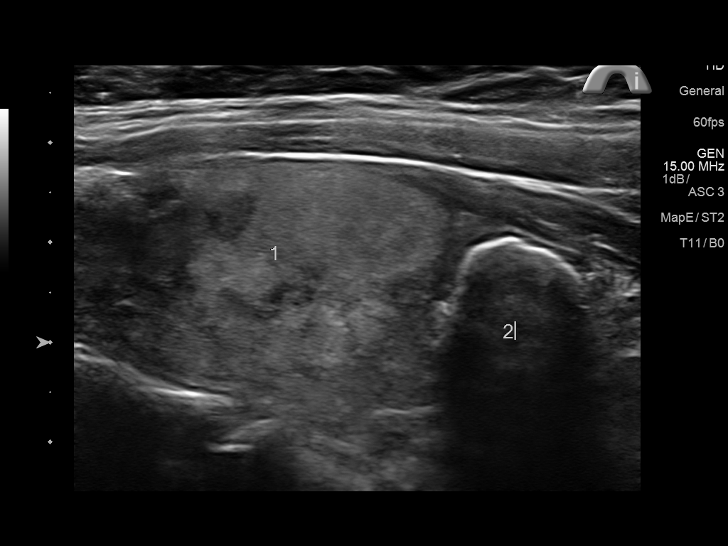
[im 43/69]
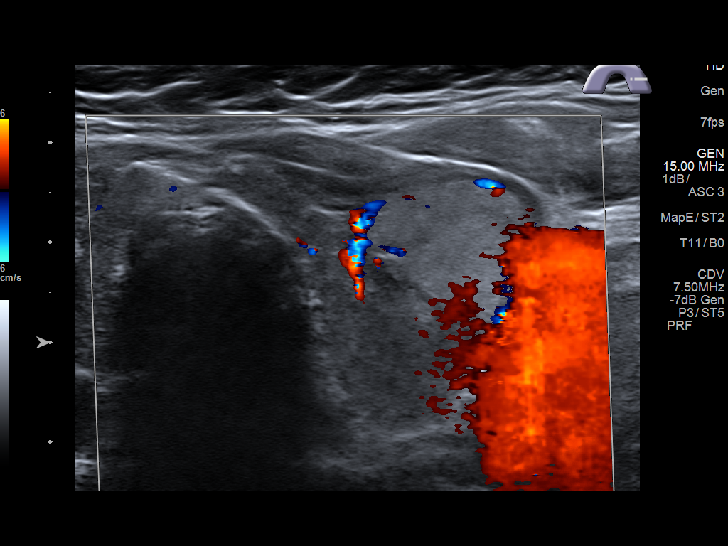
[im 49/69]
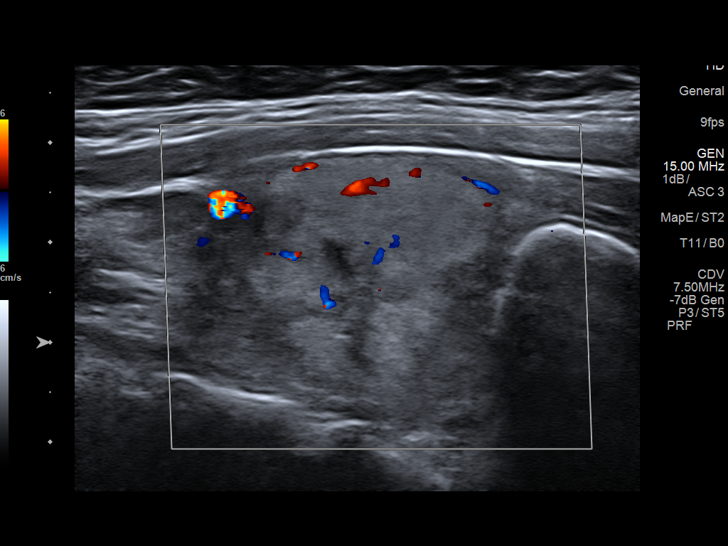
[im 54/69]
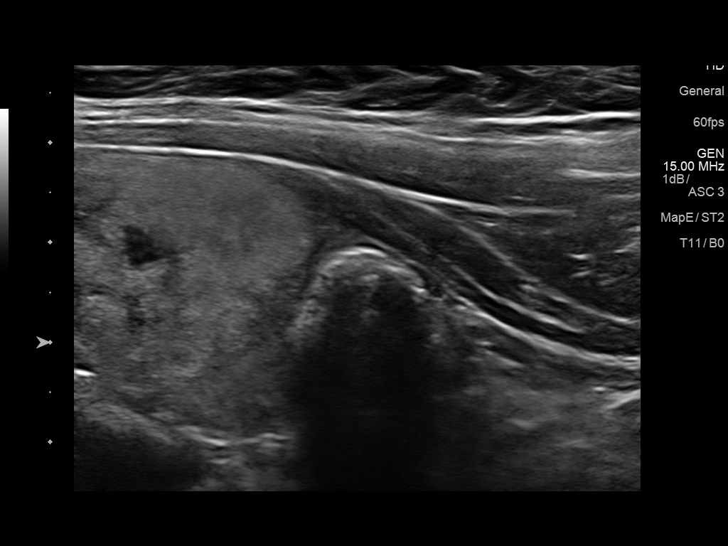
[im 60/69]
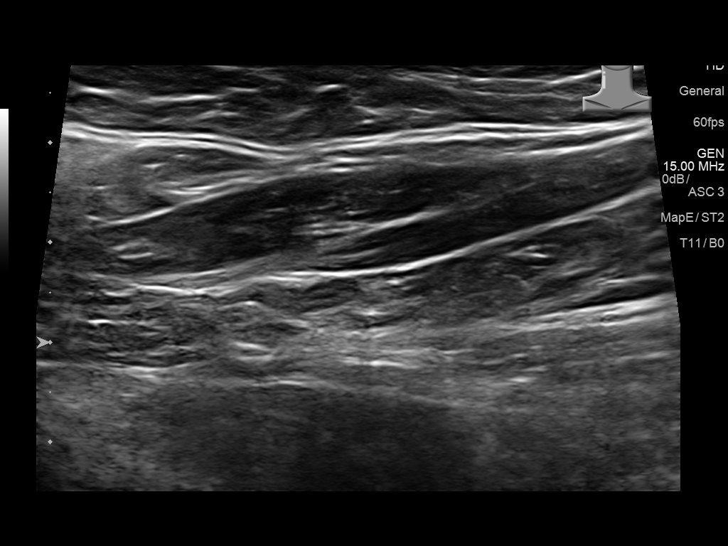
[im 66/69]
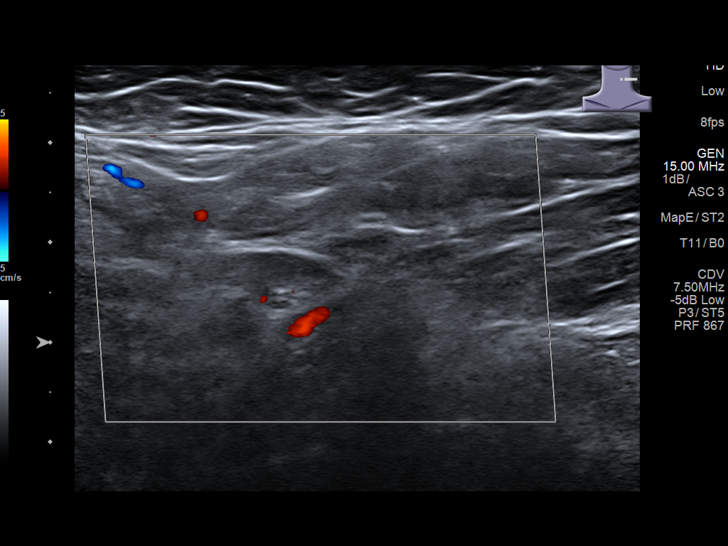

[12 of 25 positions shown; findings below may reference images not displayed]

FINDINGS: Parenchymal Echotexture: Moderately heterogenous

Isthmus: 0.5 cm

Right lobe: 4.5 x 1.9 x 2.2 cm

Left lobe: 5.8 x 2.9 x 2.2 cm

_________________________________________________________

Estimated total number of nodules >/= 1 cm: 3

Number of spongiform nodules >/=  2 cm not described below (TR1): 0

Number of mixed cystic and solid nodules >/= 1.5 cm not described
below (TR2): 0

_________________________________________________________

Nodule # 1:

Location: Right; Mid

Maximum size: 2.4 cm; Other 2 dimensions: 2.0 x 1.4 cm

Composition: solid/almost completely solid (2)

Echogenicity: isoechoic (1)

Shape: not taller-than-wide (0)

Margins: smooth (0)

Echogenic foci: none (0)

ACR TI-RADS total points: 3.

ACR TI-RADS risk category: TR3 (3 points).

ACR TI-RADS recommendations:

*Given size (>/= 1.5 - 2.4 cm) and appearance, a follow-up
ultrasound in 1 year should be considered based on TI-RADS criteria.

_________________________________________________________

Nodule # 2:

Location: Left; Superior

Maximum size: 2.6 cm; Other 2 dimensions: 2.3 x 2.2 cm

Composition: solid/almost completely solid (2)

Echogenicity: isoechoic (1)

Shape: not taller-than-wide (0)

Margins: smooth (0)

Echogenic foci: none (0)

ACR TI-RADS total points: 3.

ACR TI-RADS risk category: TR3 (3 points).

ACR TI-RADS recommendations:

**Given size (>/= 2.5 cm) and appearance, fine needle aspiration of
this mildly suspicious nodule should be considered based on TI-RADS
criteria.

_________________________________________________________

Nodule # 3:

Location: Left; Inferior

Maximum size: 1.6 cm; Other 2 dimensions: 1.6 x 1.5 cm

Composition: cannot determine (2)

Echogenicity: cannot determine (1)

Shape: not taller-than-wide (0)

Margins: ill-defined (0)

Echogenic foci: peripheral calcifications (2)

ACR TI-RADS total points: 5.

ACR TI-RADS risk category: TR4 (4-6 points).

ACR TI-RADS recommendations:

**Given size (>/= 1.5 cm) and appearance, fine needle aspiration of
this moderately suspicious nodule should be considered based on
TI-RADS criteria.

_________________________________________________________
IMPRESSION: 1. The 2.6 cm TI-RADS category 3 nodule in the left upper gland
(labeled # 2) meets criteria for consideration of fine-needle
aspiration biopsy.
2. The 1.6 cm peripherally calcified TI-RADS category 4 nodule in
the left inferior gland (labeled # 3) meets criteria for
consideration of fine-needle aspiration biopsy.
3. The 2.4 cm TI-RADS category 3 nodule in the right mid gland
(labeled # 1) meets criteria for follow-up ultrasound in 1 year.

The above is in keeping with the ACR TI-RADS recommendations - [HOSPITAL] 0341;[DATE].

## 2020-06-14 ENCOUNTER — Encounter: Payer: Self-pay | Admitting: Internal Medicine

## 2020-06-14 ENCOUNTER — Other Ambulatory Visit: Payer: Self-pay | Admitting: Internal Medicine

## 2020-06-14 DIAGNOSIS — R000 Tachycardia, unspecified: Secondary | ICD-10-CM

## 2020-06-28 ENCOUNTER — Ambulatory Visit: Payer: BC Managed Care – PPO | Admitting: Internal Medicine

## 2020-06-28 ENCOUNTER — Other Ambulatory Visit: Payer: Self-pay | Admitting: Internal Medicine

## 2020-06-28 ENCOUNTER — Encounter: Payer: Self-pay | Admitting: Internal Medicine

## 2020-06-28 MED ORDER — LORAZEPAM 1 MG PO TABS: ORAL_TABLET | ORAL | 0 refills | Status: DC

## 2020-06-29 ENCOUNTER — Telehealth: Payer: Self-pay

## 2020-06-29 ENCOUNTER — Encounter: Payer: Self-pay | Admitting: Internal Medicine

## 2020-06-29 ENCOUNTER — Telehealth: Payer: BC Managed Care – PPO | Admitting: Internal Medicine

## 2020-06-29 DIAGNOSIS — R269 Unspecified abnormalities of gait and mobility: Secondary | ICD-10-CM

## 2020-06-29 DIAGNOSIS — D8689 Sarcoidosis of other sites: Secondary | ICD-10-CM

## 2020-06-29 DIAGNOSIS — E118 Type 2 diabetes mellitus with unspecified complications: Secondary | ICD-10-CM | POA: Diagnosis not present

## 2020-06-29 MED ORDER — CALCIUM CITRATE-VITAMIN D 315-200 MG-UNIT PO TABS: 2.0000 | ORAL_TABLET | Freq: Two times a day (BID) | ORAL | 1 refills | Status: AC

## 2020-06-29 NOTE — Progress Notes (Signed)
Date:  06/29/2020   Name:  Gabrielle Tucker   DOB:  06-24-1960   MRN:  [id number]  This encounter was conducted via video encounter due to the need for social distancing in light of the Covid-19 pandemic.  The patient was correctly identified.  I advised that I am conducting the visit from a secure room in my office at [facility] clinic.  The patient is located at home. The limitations of this form of encounter were discussed with the patient and he/she agreed to proceed.  Some vital signs will be absent.  Chief Complaint: short term disability  Extremity Weakness  Pain location: numb below the waist. This is a new (hospitalized with weakness and perianal numbness.) problem. The current episode started 1 to 4 weeks ago. There has been no history of extremity trauma. The problem occurs constantly. The problem has been unchanged. Associated symptoms include numbness (from the waist down) and tingling. The symptoms are aggravated by standing and activity.  Diabetes She presents for her follow-up diabetic visit. She has type 2 diabetes mellitus. Her disease course has been stable. Associated symptoms include weakness. Current diabetic treatments: on Ozempic and metformin.    Lab Results  Component Value Date   CREATININE 0.8 04/05/2020   BUN 8 04/05/2020   NA 141 04/05/2020   K 4.8 04/05/2020   CL 105 04/05/2020   CO2 31 (A) 04/05/2020   Lab Results  Component Value Date   CHOL 152 07/09/2019   HDL 41 07/09/2019   LDLCALC 81 07/09/2019   TRIG 174 (H) 07/09/2019   CHOLHDL 3.7 07/09/2019   Lab Results  Component Value Date   TSH 0.13 (A) 04/05/2020   Lab Results  Component Value Date   HGBA1C 5.9 04/05/2020   Lab Results  Component Value Date   WBC 9.6 04/05/2020   HGB 13.1 04/05/2020   HCT 41 04/05/2020   MCV 92 01/07/2019   PLT 207 04/05/2020   Lab Results  Component Value Date   ALT 30 07/09/2019   AST 34 07/09/2019   ALKPHOS 153 (H) 07/09/2019   BILITOT 0.7  07/09/2019     Review of Systems  Respiratory: Negative for chest tightness and shortness of breath.   Cardiovascular: Negative for leg swelling.  Gastrointestinal: Positive for constipation.  Genitourinary: Positive for difficulty urinating.       Pelvic numbness and fecal leakage  Musculoskeletal: Positive for extremity weakness and gait problem (uses walker and can make it without at home).  Neurological: Positive for tingling, weakness and numbness (from the waist down).    Patient Active Problem List   Diagnosis Date Noted  . Status post hysterectomy 03/01/2020  . Type II diabetes mellitus with complication ([place]) 31/54/0086  . Mood disorder ([place]) 01/07/2019  . S/P total thyroidectomy 12/16/2017  . Multiple thyroid nodules 11/12/2017  . Hyperlipidemia, mild 08/03/2016  . Arthritis of knee, left 08/02/2016  . Back muscle spasm 07/03/2014  . H/O peptic ulcer 07/03/2014  . Migraine without aura and responsive to treatment 07/03/2014  . Muscle contraction headache 07/03/2014  . Idiopathic insomnia 07/03/2014  . Tachycardia 07/03/2014    Allergies  Allergen Reactions  . Morphine And Related Nausea And Vomiting  . Tape Hives and Rash    No [paper tape.  Tega derm OK    Past Surgical History:  Procedure Laterality Date  . ABDOMINAL HYSTERECTOMY     ovaries remain  . ANTERIOR FUSION CERVICAL SPINE  2006  . BREAST  REDUCTION SURGERY    . CHOLECYSTECTOMY  05/05/2019  . ESOPHAGOGASTRODUODENOSCOPY  2011   gastric ulcer  . LITHOTRIPSY  2012   with setnet  . LUMBAR DISC SURGERY    . NASAL SINUS SURGERY  2015  . partail knee replacement Bilateral   . REDUCTION MAMMAPLASTY Bilateral 2011  . THYROIDECTOMY Bilateral 12/16/2017   Procedure: THYROIDECTOMY;  Surgeon: Margaretha Sheffield, MD;  Location: [facility] ORS;  Service: ENT;  Laterality: Bilateral;    Social History   Tobacco Use  . Smoking status: Never Smoker  . Smokeless tobacco: Never Used  Vaping Use  . Vaping Use: Never  used  Substance Use Topics  . Alcohol use: Yes    Alcohol/week: 0.0 - 1.0 standard drinks    Comment: on occassion  . Drug use: Not Currently    Types: Marijuana     Medication list has been reviewed and updated.  Current Meds  Medication Sig  . albuterol (VENTOLIN HFA) 108 (90 Base) MCG/ACT inhaler Inhale 2 puffs into the lungs every 6 (six) hours as needed for wheezing or shortness of breath.  Marland Kitchen atorvastatin (LIPITOR) 10 MG tablet Take 1 tablet (10 mg total) by mouth daily.  Marland Kitchen escitalopram (LEXAPRO) 20 MG tablet TAKE (1) TABLET BY MOUTH EVERY DAY  . Fexofenadine HCl (MUCINEX ALLERGY PO) Take by mouth as needed.  . fluticasone (FLONASE) 50 MCG/ACT nasal spray Place 2 sprays into both nostrils daily.  Marland Kitchen gabapentin (NEURONTIN) 300 MG capsule Take by mouth 3 (three) times daily. 1 in AM, 2 in the afternoon, 3 at bedtime  . hydrOXYzine (ATARAX/VISTARIL) 25 MG tablet TK 1 TO 2 TS PO QHS  . ibuprofen (ADVIL,MOTRIN) 600 MG tablet Take 1 tablet (600 mg total) by mouth every 6 (six) hours as needed.  . Levothyroxine Sodium 150 MCG/ML SOLN Take 150 mcg by mouth daily before breakfast.   . LORazepam (ATIVAN) 1 MG tablet Take one tablet 30 minutes before imaging studies.  Do not drive when taking.  . metFORMIN (GLUCOPHAGE-XR) 500 MG 24 hr tablet Take 500 mg by mouth 2 (two) times daily.  . metoprolol succinate (TOPROL-XL) 50 MG 24 hr tablet TAKE (1) TABLET BY MOUTH EVERY DAY  . pantoprazole (PROTONIX) 40 MG tablet TAKE (1) TABLET BY MOUTH TWICE DAILY  . predniSONE (DELTASONE) 20 MG tablet Take 60 mg by mouth daily.  . promethazine-dextromethorphan (PROMETHAZINE-DM) 6.25-15 MG/5ML syrup Take 5 mLs by mouth 4 (four) times daily as needed for cough.  . Semaglutide,0.25 or 0.5MG /DOS, 2 MG/1.5ML SOPN Inject 0.5 mg into the skin once a week.  . sucralfate (CARAFATE) 1 g tablet Take 1 tablet (1 g total) by mouth 4 (four) times daily -  with meals and at bedtime.  . sulfamethoxazole-trimethoprim  (BACTRIM) 400-80 MG tablet Take by mouth daily.  Marland Kitchen tiZANidine (ZANAFLEX) 4 MG tablet TAKE (1) TABLET BY MOUTH DAILY AS NEEDEDFOR MUSCLE SPASMS.  . zaleplon (SONATA) 10 MG capsule TAKE (1) CAPSULE BY MOUTH EVERY NIGHT AT BEDTIME AS NEEDED FOR SLEEP  . [DISCONTINUED] calcium citrate-vitamin D (CITRACAL+D) 315-200 MG-UNIT tablet Take by mouth.    PHQ 2/9 Scores 06/29/2020 06/29/2020 05/24/2020 03/09/2020  PHQ - 2 Score 1 0 0 0  PHQ- 9 Score 1 6 0 0    GAD 7 : Generalized Anxiety Score 06/29/2020 06/29/2020 05/24/2020 03/09/2020  Nervous, Anxious, on Edge 0 0 0 0  Control/stop worrying 0 0 0 0  Worry too much - different things 0 0 0 0  Trouble relaxing 0  0 0 0  Restless 0 0 0 0  Easily annoyed or irritable 0 0 0 0  Afraid - awful might happen 0 0 0 0  Total GAD 7 Score 0 0 0 0  Anxiety Difficulty - - Not difficult at all Not difficult at all    BP Readings from Last 3 Encounters:  05/24/20 120/68  03/09/20 124/84  12/11/19 126/76    Physical Exam Vitals and nursing note reviewed.  Constitutional:      General: She is not in acute distress.    Appearance: She is well-developed.  HENT:     Head: Normocephalic and atraumatic.  Pulmonary:     Effort: Pulmonary effort is normal. No respiratory distress.  Musculoskeletal:     Comments: Unable to assess motor function  Skin:    General: Skin is warm and dry.     Findings: No rash.  Neurological:     Mental Status: She is alert and oriented to person, place, and time.     Comments: Unable to perform neurological exam - patient reports no change in numbness and tingling from the waist down.  Psychiatric:        Mood and Affect: Mood normal.        Behavior: Behavior normal.     Wt Readings from Last 3 Encounters:  05/24/20 223 lb (101.2 kg)  03/09/20 224 lb (101.6 kg)  12/11/19 224 lb (101.6 kg)    There were no vitals taken for this visit.  Assessment and Plan: 1. 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 On high  dose Prednisone until seen by Neurology Disability forms have already been sent.  Will also fax this note.  2. Gait disturbance Continue to use walker to ambulate for safety.  3. Type II diabetes mellitus with complication (HCC) Now on metformin and Ozempic Followed closely by Endocrinology  I spent 14 minutes on this encounter. Partially dictated using [employer], [employer]. Any errors are unintentional.  Halina Maidens, MD [facility]  06/29/2020

## 2020-06-29 NOTE — Telephone Encounter (Signed)
This visit type is being conducted due to national recommendations for restrictions regarding the COVID- 19 Pandemic (e.g. social distancing) in effort to limit this patients exposure and mitigate transmission in our community. This visit type is felt to be most appropriate for this patient at this time. I connected with the patient today and received telephone consent from the patient and patient understand this consent will be good for 1 year.  KP  

## 2020-06-30 ENCOUNTER — Telehealth: Payer: Self-pay

## 2020-06-30 NOTE — Telephone Encounter (Signed)
OV printed and faxed.  KP

## 2020-06-30 NOTE — Telephone Encounter (Signed)
Copied from [place] [id number]. Topic: General - Other >> Jun 30, 2020  8:33 AM Alanda Slim E wrote: Reason for CRM: [facility] needs OV notes for DOS June 7.2022/ fax# [phone]

## 2020-07-04 ENCOUNTER — Other Ambulatory Visit: Payer: Self-pay | Admitting: Internal Medicine

## 2020-07-04 DIAGNOSIS — F5101 Primary insomnia: Secondary | ICD-10-CM

## 2020-07-04 NOTE — Telephone Encounter (Signed)
Requested medication (s) are due for refill today: yes   Requested medication (s) are on the active medication list: yes   Last refill: 05/31/2020  Future visit scheduled: no   Notes to clinic: this refill cannot be delegated    Requested Prescriptions  Pending Prescriptions Disp Refills   zaleplon (SONATA) 10 MG capsule [Pharmacy Med Name: ZALEPLON 10 MG CAP] 30 capsule     Sig: TAKE (1) CAPSULE BY MOUTH EVERY NIGHT AT BEDTIME AS NEEDED FOR SLEEP      Not Delegated - Psychiatry:  Anxiolytics/Hypnotics Failed - 07/04/2020  8:31 AM      Failed - This refill cannot be delegated      Failed - Urine Drug Screen completed in last 360 days      Passed - Valid encounter within last 6 months    Recent Outpatient Visits           5 days ago 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

## 2020-07-04 NOTE — Telephone Encounter (Signed)
Please review. Last office visit 06/29/2020.  KP

## 2020-07-06 ENCOUNTER — Encounter: Payer: Self-pay | Admitting: Internal Medicine

## 2020-07-11 ENCOUNTER — Other Ambulatory Visit: Payer: Self-pay | Admitting: Internal Medicine

## 2020-07-11 DIAGNOSIS — F39 Unspecified mood [affective] disorder: Secondary | ICD-10-CM

## 2020-07-14 DIAGNOSIS — M47813 Spondylosis without myelopathy or radiculopathy, cervicothoracic region: Secondary | ICD-10-CM | POA: Diagnosis not present

## 2020-07-14 DIAGNOSIS — M5126 Other intervertebral disc displacement, lumbar region: Secondary | ICD-10-CM | POA: Diagnosis not present

## 2020-07-14 DIAGNOSIS — M049 Autoinflammatory syndrome, unspecified: Secondary | ICD-10-CM | POA: Diagnosis not present

## 2020-07-29 DIAGNOSIS — Z6838 Body mass index (BMI) 38.0-38.9, adult: Secondary | ICD-10-CM | POA: Diagnosis not present

## 2020-07-29 DIAGNOSIS — D869 Sarcoidosis, unspecified: Secondary | ICD-10-CM | POA: Diagnosis not present

## 2020-07-29 DIAGNOSIS — G0491 Myelitis, unspecified: Secondary | ICD-10-CM | POA: Diagnosis not present

## 2020-07-29 DIAGNOSIS — Z79899 Other long term (current) drug therapy: Secondary | ICD-10-CM | POA: Diagnosis not present

## 2020-07-29 DIAGNOSIS — M792 Neuralgia and neuritis, unspecified: Secondary | ICD-10-CM | POA: Diagnosis not present

## 2020-07-29 DIAGNOSIS — Z7952 Long term (current) use of systemic steroids: Secondary | ICD-10-CM | POA: Diagnosis not present

## 2020-07-29 DIAGNOSIS — D84821 Immunodeficiency due to drugs: Secondary | ICD-10-CM | POA: Diagnosis not present

## 2020-08-05 DIAGNOSIS — Z7952 Long term (current) use of systemic steroids: Secondary | ICD-10-CM | POA: Insufficient documentation

## 2020-08-05 HISTORY — DX: Long term (current) use of systemic steroids: Z79.52

## 2020-08-06 ENCOUNTER — Other Ambulatory Visit: Payer: Self-pay

## 2020-08-06 ENCOUNTER — Ambulatory Visit (INDEPENDENT_AMBULATORY_CARE_PROVIDER_SITE_OTHER): Payer: BC Managed Care – PPO

## 2020-08-06 ENCOUNTER — Ambulatory Visit
Admission: EM | Admit: 2020-08-06 | Discharge: 2020-08-06 | Disposition: A | Discharge status: Home / Self Care | Payer: BC Managed Care – PPO

## 2020-08-06 DIAGNOSIS — S6992XA Unspecified injury of left wrist, hand and finger(s), initial encounter: Secondary | ICD-10-CM

## 2020-08-06 DIAGNOSIS — S62321A Displaced fracture of shaft of second metacarpal bone, left hand, initial encounter for closed fracture: Secondary | ICD-10-CM

## 2020-08-06 DIAGNOSIS — M79642 Pain in left hand: Secondary | ICD-10-CM

## 2020-08-06 DIAGNOSIS — S62301A Unspecified fracture of second metacarpal bone, left hand, initial encounter for closed fracture: Secondary | ICD-10-CM | POA: Diagnosis not present

## 2020-08-06 MED ORDER — HYDROCODONE-ACETAMINOPHEN 5-325 MG PO TABS: 1.0000 | ORAL_TABLET | Freq: Four times a day (QID) | ORAL | 0 refills | Status: DC | PRN

## 2020-08-06 NOTE — Discharge Instructions (Addendum)
Wear the volar splint at all times to help protect your broken hand and prevent further injury.  This will also help decrease pain as it will impair movement of your fingers.  Keep your left hand elevated is much as possible.  This will decrease swelling and aid in healing.  Use over-the-counter Tylenol and ibuprofen according to the package instructions as needed for mild to moderate pain.  Take the Norco every 6 hours as needed for severe pain.  Norco has Tylenol in it so make sure that you are not taking more than 4000 mg of Tylenol a day.  Do not take the Norco in conjunction with your Sonata.  Both of these medications cause sedation and taken them together may cause increased sedation or respiratory suppression.  I would like you to follow-up with the hand specialist at [facility].  Her name is Dr. Verita Lamb and her phone number is [phone].  Call the office next week to make an appointment for evaluation.

## 2020-08-06 NOTE — ED Provider Notes (Addendum)
[facility]    CSN: [id number] Arrival date & time: 08/06/20  0946      History   Chief Complaint Chief Complaint  Patient presents with   Hand Pain    HPI Gabrielle Tucker is a 60 y.o. female.   HPI  60 year old female here for evaluation of left hand pain.  Patient reports that she was trying to get a glass container from a high shelf on 07/25/2020 when she dropped a container on her left hand.  She reports that she had significant pain at the time as well as swelling and bruising.  She reports that the bruising has resolved but the swelling and pain remains.  She is able to move her hand and has full range of motion of all of her fingers but reports that when she flexes her index finger that increases the pain in her hand.  She denies numbness or tingling in any of her fingers.  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   Hypertension    Personal history of radiation therapy    had iodine rad therapy    Patient Active Problem List   Diagnosis Date Noted   Status post hysterectomy 03/01/2020   Type II diabetes mellitus with complication ([place]) 07/37/1062   Mood disorder ([place]) 01/07/2019   S/P total thyroidectomy 12/16/2017   Multiple thyroid nodules 11/12/2017   Hyperlipidemia, mild 08/03/2016   Arthritis of knee, left 08/02/2016   Back muscle spasm 07/03/2014   H/O peptic ulcer 07/03/2014   Migraine without aura and responsive to treatment 07/03/2014   Muscle contraction headache 07/03/2014   Idiopathic insomnia 07/03/2014   Tachycardia 07/03/2014    Past Surgical History:  Procedure Laterality Date   ABDOMINAL HYSTERECTOMY     ovaries remain   ANTERIOR FUSION CERVICAL SPINE  2006   BREAST REDUCTION SURGERY     CHOLECYSTECTOMY  05/05/2019   ESOPHAGOGASTRODUODENOSCOPY  2011   gastric ulcer   LITHOTRIPSY  2012   with setnet   LUMBAR  [place]     NASAL SINUS SURGERY  2015   partail knee replacement Bilateral    REDUCTION MAMMAPLASTY Bilateral 2011   THYROIDECTOMY Bilateral 12/16/2017   Procedure: THYROIDECTOMY;  Surgeon: Margaretha Sheffield, MD;  Location: [facility] ORS;  Service: ENT;  Laterality: Bilateral;    OB History   No obstetric history on file.      Home Medications    Prior to Admission medications   Medication Sig Start Date End Date Taking? Authorizing Provider  albuterol (VENTOLIN HFA) 108 (90 Base) MCG/ACT inhaler Inhale 2 puffs into the lungs every 6 (six) hours as needed for wheezing or shortness of breath. 03/09/20  Yes Glean Hess, MD  atorvastatin (LIPITOR) 10 MG tablet Take 1 tablet (10 mg total) by mouth daily. 07/07/19  Yes Glean Hess, MD  escitalopram (LEXAPRO) 20 MG tablet TAKE (1) TABLET BY MOUTH EVERY DAY 07/11/20  Yes Glean Hess, MD  fluticasone [facility]) 50 MCG/ACT nasal spray Place 2 sprays into both nostrils daily. 12/11/19  Yes Glean Hess, MD  gabapentin (NEURONTIN) 300 MG capsule Take by mouth 3 (three) times daily. 1 in AM, 2 in the afternoon, 3 at bedtime 06/12/20  Yes [provider]  HYDROcodone-acetaminophen (NORCO/VICODIN) 5-325 MG tablet Take 1-2 tablets by mouth every 6 (six) hours as needed. 08/06/20  Yes Margarette Canada, NP  hydrOXYzine (ATARAX/VISTARIL) 25 MG tablet TK 1 TO 2 TS PO QHS 12/24/17  Yes [provider]  levothyroxine (SYNTHROID) 150 MCG tablet Take 150 mcg by mouth daily. 05/17/20  Yes [provider]  metFORMIN (GLUCOPHAGE-XR) 500 MG 24 hr tablet Take 500 mg by mouth 2 (two) times daily. 06/21/20  Yes [provider]  metoprolol succinate (TOPROL-XL) 50 MG 24 hr tablet TAKE (1) TABLET BY MOUTH EVERY DAY 06/14/20  Yes Glean Hess, MD  pantoprazole (PROTONIX) 40 MG tablet TAKE (1) TABLET BY MOUTH TWICE DAILY 03/29/20  Yes Glean Hess, MD  predniSONE (DELTASONE) 20 MG tablet Take 60 mg by mouth daily. 06/12/20   Yes [provider]  Semaglutide,0.25 or 0.5MG /DOS, 2 MG/1.5ML SOPN Inject 0.5 mg into the skin once a week. 07/22/19  Yes [provider]  sucralfate (CARAFATE) 1 g tablet Take 1 tablet (1 g total) by mouth 4 (four) times daily -  with meals and at bedtime. 05/24/20  Yes Glean Hess, MD  sulfamethoxazole-trimethoprim (BACTRIM) 400-80 MG tablet Take by mouth daily. 06/12/20  Yes [provider]  tiZANidine (ZANAFLEX) 4 MG tablet TAKE (1) TABLET BY MOUTH DAILY AS NEEDEDFOR MUSCLE SPASMS. 05/05/20  Yes Glean Hess, MD  zaleplon (SONATA) 10 MG capsule TAKE (1) CAPSULE BY MOUTH EVERY NIGHT AT BEDTIME AS NEEDED FOR SLEEP 07/04/20  Yes Glean Hess, MD  calcium citrate-vitamin D (CITRACAL+D) 315-200 MG-UNIT tablet Take 2 tablets by mouth 2 (two) times daily. 06/29/20 06/29/21  Glean Hess, MD  Fexofenadine HCl [facility] ALLERGY PO) Take by mouth as needed.    [provider]  ibuprofen (ADVIL,MOTRIN) 600 MG tablet Take 1 tablet (600 mg total) by mouth every 6 (six) hours as needed. 03/15/18   Melynda Ripple, MD    Family History Family History  Problem Relation Age of Onset   Stomach cancer Father    Diabetes Brother    Breast cancer Neg Hx     Social History Social History   Tobacco Use   Smoking status: Never   Smokeless tobacco: Never  Vaping Use   Vaping Use: Never used  Substance Use Topics   Alcohol use: Yes    Alcohol/week: 0.0 - 1.0 standard drinks    Comment: on occassion   Drug use: Not Currently    Types: Marijuana     Allergies   Morphine and related and Tape   Review of Systems Review of Systems  Constitutional:  Negative for activity change and appetite change.  Musculoskeletal:  Positive for myalgias.  Skin:  Positive for color change. Negative for wound.  Neurological:  Negative for weakness and numbness.    Physical Exam Triage Vital Signs ED Triage Vitals  Enc Vitals Group     BP 08/06/20 1004 (!)  128/102     Pulse Rate 08/06/20 1004 86     Resp 08/06/20 1004 18     Temp 08/06/20 1004 98 F (36.7 C)     Temp Source 08/06/20 1004 Oral     SpO2 08/06/20 1004 99 %     Weight 08/06/20 1001 230 lb (104.3 kg)     Height 08/06/20 1001 5\' 6"  (1.676 m)     Head Circumference --      Peak Flow --      Pain Score 08/06/20 1001 4     Pain Loc --      Pain Edu? --  Excl. in GC? --    No data found.  Updated Vital Signs BP (!) 128/102 (BP Location: Left Arm)   Pulse 86   Temp 98 F (36.7 C) (Oral)   Resp 18   Ht 5\' 6"  (1.676 m)   Wt 230 lb (104.3 kg)   SpO2 99%   BMI 37.12 kg/m   Visual Acuity Right Eye Distance:   Left Eye Distance:   Bilateral Distance:    Right Eye Near:   Left Eye Near:    Bilateral Near:     Physical Exam Vitals and nursing note reviewed.  Constitutional:      General: She is not in acute distress.    Appearance: Normal appearance. She is not ill-appearing.  Musculoskeletal:        General: Swelling and tenderness present. No deformity. Normal range of motion.  Skin:    General: Skin is warm and dry.     Capillary Refill: Capillary refill takes less than 2 seconds.     Findings: No bruising or erythema.  Neurological:     General: No focal deficit present.     Mental Status: She is alert and oriented to person, place, and time.     Sensory: No sensory deficit.  Psychiatric:        Mood and Affect: Mood normal.        Behavior: Behavior normal.        Thought Content: Thought content normal.        Judgment: Judgment normal.     UC Treatments / Results  Labs (all labs ordered are listed, but only abnormal results are displayed) Labs Reviewed - No data to display  EKG   Radiology DG Hand Complete Left  Result Date: 08/06/2020 CLINICAL DATA:  Injury. Injury of the LEFT hand. Injury on 07/25/2020 EXAM: LEFT HAND - COMPLETE 3+ VIEW COMPARISON:  None. FINDINGS: There is an oblique fracture of the second metacarpal, associated  minimal displacement. There is associated soft tissue swelling. No radiopaque foreign body or soft tissue gas. IMPRESSION: Acute fracture of the second metacarpal. Electronically Signed   By: Nolon Nations M.D.   On: 08/06/2020 10:36    Procedures Procedures (including critical care time)  Medications Ordered in UC Medications - No data to display  Initial Impression / Assessment and Plan / UC Course  I have reviewed the triage vital signs and the nursing notes.  Pertinent labs & imaging results that were available during my care of the patient were reviewed by me and considered in my medical decision making (see chart for details).  Patient is a very pleasant 60 year old female here for evaluation of left hand pain as outlined in the HPI above.  Patient sustained a blow to her hand from a glass dish approximately 12 days ago and had continuing pain along the dorsal aspect of her second metacarpal.  The pain increases when she flexes her left index finger.  She has full range of motion and use of her hand.  There is mild swelling over the dorsal aspect of the second metacarpal with tenderness to palpation.  Patient denies any decree sensation or pain when palpating the MCP joint, PIP joint, or DIP joint.  Also no tenderness with palpation of the phalanges.  Cap refills less than 2 seconds.  Reports has been using Tylenol and ibuprofen at home but has not been helping her pain she still has significant pain especially with use of her hand.  Left hand images  obtained in triage.  Left hand x-ray independently reviewed and evaluated by me.  Interpretation: Patient has a linear oblique fracture from the head back to the middle of the shaft of the second metacarpal.  There are no other fractures visualized on the x-ray.  The oblique fracture is best visualized in the AP view.  Awaiting radiology overread. Radiology overread coincides with my evaluation of the left hand x-ray.  Will place patient in a  volar splint to help protect her left hand from further injury and minimize pain.  Due to the late presentation for evaluation, coupled with the roughly normal anatomical alignment of the fracture, I do not feel the need to refer the patient to the emergency department for imminent reduction or evaluation.  I will have the patient follow-up with Dr. Verita Lamb at [facility] next week.  Contact information will be given.  Patient can use Tylenol and ibuprofen for mild to moderate pain and will prescribe Norco as needed for severe pain.  Patient is on Sonata for sleep and I will advise the patient not to use these medications in conjunction with each other.   Final Clinical Impressions(s) / UC Diagnoses   Final diagnoses:  Closed displaced fracture of shaft of second metacarpal bone of left hand, initial encounter     Discharge Instructions      Wear the volar splint at all times to help protect your broken hand and prevent further injury.  This will also help decrease pain as it will impair movement of your fingers.  Keep your left hand elevated is much as possible.  This will decrease swelling and aid in healing.  Use over-the-counter Tylenol and ibuprofen according to the package instructions as needed for mild to moderate pain.  Take the Norco every 6 hours as needed for severe pain.  Norco has Tylenol in it so make sure that you are not taking more than 4000 mg of Tylenol a day.  Do not take the Norco in conjunction with your Sonata.  Both of these medications cause sedation and taken them together may cause increased sedation or respiratory suppression.  I would like you to follow-up with the hand specialist at [facility]  Her name is Dr. Verita Lamb and her phone number is [phone].  Call the office next week to make an appointment for evaluation.     ED Prescriptions     Medication Sig Dispense Auth. Provider   HYDROcodone-acetaminophen (NORCO/VICODIN)  5-325 MG tablet Take 1-2 tablets by mouth every 6 (six) hours as needed. 13 tablet Margarette Canada, NP      I have reviewed the PDMP during this encounter.   Margarette Canada, NP 08/06/20 1042    Margarette Canada, NP 08/06/20 1042

## 2020-08-06 NOTE — ED Triage Notes (Signed)
Pt c/o injury to her left hand, states some glass cookware fell on her hand 07/25/20. Pt reports the area had bruising and swelling, the bruising has improved but the swelling remains.

## 2020-08-08 DIAGNOSIS — S62225A Nondisplaced Rolando's fracture, left hand, initial encounter for closed fracture: Secondary | ICD-10-CM | POA: Diagnosis not present

## 2020-08-10 DIAGNOSIS — Z298 Encounter for other specified prophylactic measures: Secondary | ICD-10-CM | POA: Diagnosis not present

## 2020-08-10 DIAGNOSIS — Z20822 Contact with and (suspected) exposure to covid-19: Secondary | ICD-10-CM | POA: Diagnosis not present

## 2020-08-10 DIAGNOSIS — Z7952 Long term (current) use of systemic steroids: Secondary | ICD-10-CM | POA: Diagnosis not present

## 2020-08-10 DIAGNOSIS — Z23 Encounter for immunization: Secondary | ICD-10-CM | POA: Diagnosis not present

## 2020-08-15 ENCOUNTER — Other Ambulatory Visit: Payer: Self-pay | Admitting: Internal Medicine

## 2020-08-15 DIAGNOSIS — E785 Hyperlipidemia, unspecified: Secondary | ICD-10-CM

## 2020-08-22 DIAGNOSIS — S62225A Nondisplaced Rolando's fracture, left hand, initial encounter for closed fracture: Secondary | ICD-10-CM | POA: Diagnosis not present

## 2020-08-23 ENCOUNTER — Encounter: Payer: Self-pay | Admitting: Internal Medicine

## 2020-08-24 ENCOUNTER — Ambulatory Visit
Admission: RE | Admit: 2020-08-24 | Discharge: 2020-08-24 | Disposition: A | Discharge status: Home / Self Care | Payer: BC Managed Care – PPO | Attending: Internal Medicine | Admitting: Internal Medicine

## 2020-08-24 ENCOUNTER — Ambulatory Visit: Payer: BC Managed Care – PPO | Admitting: Certified Registered"

## 2020-08-24 ENCOUNTER — Encounter
Admission: RE | Disposition: A | Discharge status: Home / Self Care | Payer: Self-pay | Source: Home / Self Care | Attending: Internal Medicine

## 2020-08-24 DIAGNOSIS — R1013 Epigastric pain: Secondary | ICD-10-CM | POA: Diagnosis not present

## 2020-08-24 DIAGNOSIS — Z885 Allergy status to narcotic agent status: Secondary | ICD-10-CM | POA: Insufficient documentation

## 2020-08-24 DIAGNOSIS — Z792 Long term (current) use of antibiotics: Secondary | ICD-10-CM | POA: Diagnosis not present

## 2020-08-24 DIAGNOSIS — K2970 Gastritis, unspecified, without bleeding: Secondary | ICD-10-CM | POA: Insufficient documentation

## 2020-08-24 DIAGNOSIS — K219 Gastro-esophageal reflux disease without esophagitis: Secondary | ICD-10-CM | POA: Insufficient documentation

## 2020-08-24 DIAGNOSIS — Z888 Allergy status to other drugs, medicaments and biological substances status: Secondary | ICD-10-CM | POA: Diagnosis not present

## 2020-08-24 DIAGNOSIS — Z7952 Long term (current) use of systemic steroids: Secondary | ICD-10-CM | POA: Insufficient documentation

## 2020-08-24 DIAGNOSIS — K640 First degree hemorrhoids: Secondary | ICD-10-CM | POA: Diagnosis not present

## 2020-08-24 DIAGNOSIS — Z79899 Other long term (current) drug therapy: Secondary | ICD-10-CM | POA: Diagnosis not present

## 2020-08-24 DIAGNOSIS — K449 Diaphragmatic hernia without obstruction or gangrene: Secondary | ICD-10-CM | POA: Diagnosis not present

## 2020-08-24 DIAGNOSIS — K648 Other hemorrhoids: Secondary | ICD-10-CM | POA: Diagnosis not present

## 2020-08-24 DIAGNOSIS — K317 Polyp of stomach and duodenum: Secondary | ICD-10-CM | POA: Insufficient documentation

## 2020-08-24 DIAGNOSIS — Z79890 Hormone replacement therapy (postmenopausal): Secondary | ICD-10-CM | POA: Insufficient documentation

## 2020-08-24 DIAGNOSIS — K921 Melena: Secondary | ICD-10-CM | POA: Insufficient documentation

## 2020-08-24 DIAGNOSIS — Z7984 Long term (current) use of oral hypoglycemic drugs: Secondary | ICD-10-CM | POA: Insufficient documentation

## 2020-08-24 HISTORY — PX: COLONOSCOPY WITH PROPOFOL: SHX5780

## 2020-08-24 HISTORY — DX: Type 2 diabetes mellitus without complications: E11.9

## 2020-08-24 HISTORY — PX: ESOPHAGOGASTRODUODENOSCOPY (EGD) WITH PROPOFOL: SHX5813

## 2020-08-24 LAB — GLUCOSE, CAPILLARY: Glucose-Capillary: 109 mg/dL — ABNORMAL HIGH (ref 70–99)

## 2020-08-24 SURGERY — COLONOSCOPY WITH PROPOFOL
Anesthesia: General

## 2020-08-24 MED ORDER — LIDOCAINE HCL (CARDIAC) PF 100 MG/5ML IV SOSY
PREFILLED_SYRINGE | INTRAVENOUS | Status: DC | PRN
  Administered 2020-08-24: 100 mg via INTRAVENOUS

## 2020-08-24 MED ORDER — PROPOFOL 10 MG/ML IV BOLUS
INTRAVENOUS | Status: DC | PRN
  Administered 2020-08-24: 30 mg via INTRAVENOUS
  Administered 2020-08-24: 70 mg via INTRAVENOUS

## 2020-08-24 MED ORDER — GLYCOPYRROLATE 0.2 MG/ML IJ SOLN
INTRAMUSCULAR | Status: DC | PRN
  Administered 2020-08-24: .2 mg via INTRAVENOUS

## 2020-08-24 MED ORDER — MIDAZOLAM HCL 2 MG/2ML IJ SOLN
INTRAMUSCULAR | Status: AC
  Filled 2020-08-24: qty 2

## 2020-08-24 MED ORDER — MIDAZOLAM HCL 2 MG/2ML IJ SOLN
INTRAMUSCULAR | Status: DC | PRN
  Administered 2020-08-24: 2 mg via INTRAVENOUS

## 2020-08-24 MED ORDER — SODIUM CHLORIDE 0.9 % IV SOLN
INTRAVENOUS | Status: DC
  Administered 2020-08-24: 20 mL/h via INTRAVENOUS

## 2020-08-24 MED ORDER — PROPOFOL 500 MG/50ML IV EMUL
INTRAVENOUS | Status: DC | PRN
  Administered 2020-08-24: 145 ug/kg/min via INTRAVENOUS

## 2020-08-24 NOTE — Op Note (Signed)
[facility] Patient Name: Yvonne Lea Procedure Date: 08/24/2020 11:11 AM MRN: [id number] Account #: [id number] Date of Birth: May 16, 1960 Admit Type: Outpatient Age: 60 Room: [facility] ENDO ROOM 4 Gender: Female Note Status: Finalized Procedure:             Colonoscopy Indications:           Hematochezia Providers:             Benay Pike. Alice Reichert MD, MD Referring MD:          Halina Maidens, MD (Referring MD) Medicines:             Propofol per Anesthesia Complications:         No immediate complications. Procedure:             Pre-Anesthesia Assessment:                        - The risks and benefits of the procedure and the                         sedation options and risks were discussed with the                         patient. All questions were answered and informed                         consent was obtained.                        - Patient identification and proposed procedure were                         verified prior to the procedure by the nurse. The                         procedure was verified in the procedure room.                        - ASA Grade Assessment: III - A patient with severe                         systemic disease.                        - After reviewing the risks and benefits, the patient                         was deemed in satisfactory condition to undergo the                         procedure.                        After obtaining informed consent, the colonoscope was                         passed under direct vision. Throughout the procedure,                         the patient's blood pressure, pulse, and oxygen  saturations were monitored continuously. The                         Colonoscope was introduced through the anus and                         advanced to the the cecum, identified by appendiceal                         orifice and ileocecal valve. The colonoscopy was                          performed without difficulty. The patient tolerated                         the procedure well. The quality of the bowel                         preparation was adequate. The ileocecal valve,                         appendiceal orifice, and rectum were photographed. Findings:      The perianal and digital rectal examinations were normal. Pertinent       negatives include normal sphincter tone and no palpable rectal lesions.      Non-bleeding internal hemorrhoids were found during retroflexion. The       hemorrhoids were Grade I (internal hemorrhoids that do not prolapse).      The colon (entire examined portion) appeared normal. Impression:            - Non-bleeding internal hemorrhoids.                        - The entire examined colon is normal.                        - No specimens collected. Recommendation:        - Await pathology results from EGD, also performed                         today.                        - Patient has a contact number available for                         emergencies. The signs and symptoms of potential                         delayed complications were discussed with the patient.                         Return to normal activities tomorrow. Written                         discharge instructions were provided to the patient.                        - Resume previous diet.                        -  Continue present medications.                        - Repeat colonoscopy in 5 years for surveillance.                        - Return to physician assistant in 6 weeks.                        - Follow up with Laurine Blazer, PA-C at [facility]. [phone].                        - The findings and recommendations were discussed with                         the patient. Procedure Code(s):     --- Professional ---                        [phone], Colonoscopy, flexible; diagnostic, including                          collection of specimen(s) by brushing or washing, when                         performed (separate procedure) Diagnosis Code(s):     --- Professional ---                        K92.1, Melena (includes Hematochezia)                        K64.0, First degree hemorrhoids CPT copyright 2019 American Medical Association. All rights reserved. The codes documented in this report are preliminary and upon coder review may  be revised to meet current compliance requirements. Efrain Sella MD, MD 08/24/2020 11:49:08 AM This report has been signed electronically. Number of Addenda: 0 Note Initiated On: 08/24/2020 11:11 AM Scope Withdrawal Time: 0 hours 5 minutes 53 seconds  Total Procedure Duration: 0 hours 11 minutes 45 seconds  Estimated Blood Loss:  Estimated blood loss: none.      [facility]

## 2020-08-24 NOTE — Transfer of Care (Signed)
Immediate Anesthesia Transfer of Care Note  Patient: Gabrielle Tucker [birth year]  Procedure(s) Performed: COLONOSCOPY WITH PROPOFOL ESOPHAGOGASTRODUODENOSCOPY (EGD) WITH PROPOFOL  Patient Location: Endoscopy Unit  Anesthesia Type:General  Level of Consciousness: drowsy and patient cooperative  Airway & Oxygen Therapy: Patient Spontanous Breathing and Patient connected to face mask oxygen  Post-op Assessment: Report given to RN and Post -op Vital signs reviewed and stable  Post vital signs: Reviewed and stable  Last Vitals:  Vitals Value Taken Time  BP    Temp    Pulse    Resp    SpO2      Last Pain:  Vitals:   08/24/20 0942  TempSrc: Temporal  PainSc: 0-No pain         Complications: No notable events documented.

## 2020-08-24 NOTE — Anesthesia Procedure Notes (Signed)
Procedure Name: General with mask airway Date/Time: 08/24/2020 11:33 AM Performed by: Kelton Pillar, CRNA Pre-anesthesia Checklist: Patient identified, Emergency Drugs available, Suction available and Patient being monitored Patient Re-evaluated:Patient Re-evaluated prior to induction Oxygen Delivery Method: Simple face mask Induction Type: IV induction Placement Confirmation: positive ETCO2 and CO2 detector Dental Injury: Teeth and Oropharynx as per pre-operative assessment

## 2020-08-24 NOTE — Op Note (Signed)
[facility]enter[facility] Patient Name: Gabrielle Tucker Procedure Date: 08/24/2020 11:11 AM MRN: [id number] Account #: [id number] Date of Birth: [birth year] Admit Type: Outpatient Age: 60 Room: [facility] ENDO ROOM 4 Gender: Female Note Status: Finalized Procedure:             Upper GI endoscopy Indications:           Epigastric abdominal pain, Gastro-esophageal reflux                         disease Providers:             Benay Pike. Alice Reichert MD, MD Referring MD:          Halina Maidens, MD (Referring MD) Medicines:             Propofol per Anesthesia Complications:         No immediate complications. Procedure:             Pre-Anesthesia Assessment:                        - The risks and benefits of the procedure and the                         sedation options and risks were discussed with the                         patient. All questions were answered and informed                         consent was obtained.                        - Patient identification and proposed procedure were                         verified prior to the procedure by the nurse. The                         procedure was verified in the procedure room.                        - ASA Grade Assessment: III - A patient with severe                         systemic disease.                        - After reviewing the risks and benefits, the patient                         was deemed in satisfactory condition to undergo the                         procedure.                        After obtaining informed consent, the endoscope was                         passed under direct vision. Throughout the procedure,  the patient's blood pressure, pulse, and oxygen                         saturations were monitored continuously. The Endoscope                         was introduced through the mouth, and advanced to the                         third part of duodenum. The upper GI  endoscopy was                         accomplished without difficulty. The patient tolerated                         the procedure well. Findings:      The esophagus was normal.      A 1 cm hiatal hernia was present.      Diffuse moderate inflammation characterized by congestion (edema) and       erythema was found in the gastric antrum. Biopsies were taken with a       cold forceps for Helicobacter pylori testing.      A few small sessile polyps with no bleeding and no stigmata of recent       bleeding were found in the gastric body. Biopsies were taken with a cold       forceps for histology.      The examined duodenum was normal.      The exam was otherwise without abnormality. Impression:            - Normal esophagus.                        - 1 cm hiatal hernia.                        - Gastritis. Biopsied.                        - A few gastric polyps. Biopsied.                        - Normal examined duodenum.                        - The examination was otherwise normal. Recommendation:        - Await pathology results.                        - Proceed with colonoscopy Procedure Code(s):     --- Professional ---                        [phone], Esophagogastroduodenoscopy, flexible,                         transoral; with biopsy, single or multiple Diagnosis Code(s):     --- Professional ---                        K21.9, Gastro-esophageal reflux disease without  esophagitis                        R10.13, Epigastric pain                        K31.7, Polyp of stomach and duodenum                        K29.70, Gastritis, unspecified, without bleeding                        K44.9, Diaphragmatic hernia without obstruction or                         gangrene CPT copyright 2019 American Medical Association. All rights reserved. The codes documented in this report are preliminary and upon coder review may  be revised to meet current compliance  requirements. Efrain Sella MD, MD 08/24/2020 11:32:23 AM This report has been signed electronically. Number of Addenda: 0 Note Initiated On: 08/24/2020 11:11 AM Estimated Blood Loss:  Estimated blood loss: none.      [facility]

## 2020-08-24 NOTE — H&P (Signed)
Outpatient short stay form Pre-procedure 08/24/2020 11:10 AM Gabrielle Tucker. Alice Reichert, M.D.  Primary Physician: Halina Maidens, M.D.  Reason for visit:  Epigastric pain, GERD, rectal bleeding  History of present illness:  60 y/o female presents with intermittent rectal bleeding, small amount. Hx of Schatzki Ring on EGD in 09/2016, but no dysphagia presently.  Has hx of TA polyp x 2 in 09/2016.     Current Facility-Administered Medications:    0.9 %  sodium chloride infusion, , Intravenous, Continuous, Lutie Pickler, Benay Pike, MD, Last Rate: 20 mL/hr at 08/24/20 1000, 20 mL/hr at 08/24/20 1000  Medications Prior to Admission  Medication Sig Dispense Refill Last Dose   albuterol (VENTOLIN HFA) 108 (90 Base) MCG/ACT inhaler Inhale 2 puffs into the lungs every 6 (six) hours as needed for wheezing or shortness of breath. 1 each 0 Past Week   atorvastatin (LIPITOR) 10 MG tablet TAKE (1) TABLET BY MOUTH EVERY DAY 90 tablet 0 08/23/2020   calcium citrate-vitamin D (CITRACAL+D) 315-200 MG-UNIT tablet Take 2 tablets by mouth 2 (two) times daily. 360 tablet 1 08/23/2020   escitalopram (LEXAPRO) 20 MG tablet TAKE (1) TABLET BY MOUTH EVERY DAY 90 tablet 0 08/23/2020   Fexofenadine HCl (MUCINEX ALLERGY PO) Take by mouth as needed.   Past Week   fluticasone (FLONASE) 50 MCG/ACT nasal spray Place 2 sprays into both nostrils daily. 16 g 1 Past Week   gabapentin (NEURONTIN) 300 MG capsule Take by mouth 3 (three) times daily. 1 in AM, 2 in the afternoon, 3 at bedtime   08/23/2020   HYDROcodone-acetaminophen (NORCO/VICODIN) 5-325 MG tablet Take 1-2 tablets by mouth every 6 (six) hours as needed. 13 tablet 0 Past Week   hydrOXYzine (ATARAX/VISTARIL) 25 MG tablet TK 1 TO 2 TS PO QHS  1 08/23/2020   hyoscyamine (LEVSIN SL) 0.125 MG SL tablet Place 0.125 mg under the tongue every 4 (four) hours as needed for cramping.   Past Week   ibuprofen (ADVIL,MOTRIN) 600 MG tablet Take 1 tablet (600 mg total) by mouth every 6 (six) hours as needed.  30 tablet 0 Past Week   levothyroxine (SYNTHROID) 150 MCG tablet Take 150 mcg by mouth daily.   08/23/2020   metFORMIN (GLUCOPHAGE-XR) 500 MG 24 hr tablet Take 500 mg by mouth 2 (two) times daily.   Past Week   metoprolol succinate (TOPROL-XL) 50 MG 24 hr tablet TAKE (1) TABLET BY MOUTH EVERY DAY 90 tablet 0 08/23/2020   pantoprazole (PROTONIX) 40 MG tablet TAKE (1) TABLET BY MOUTH TWICE DAILY 180 tablet 1 08/23/2020   predniSONE (DELTASONE) 20 MG tablet Take 60 mg by mouth daily.   08/23/2020   sucralfate (CARAFATE) 1 g tablet Take 1 tablet (1 g total) by mouth 4 (four) times daily -  with meals and at bedtime. 90 tablet 1 Past Month   sulfamethoxazole-trimethoprim (BACTRIM) 400-80 MG tablet Take by mouth daily.   08/23/2020   tiZANidine (ZANAFLEX) 4 MG tablet TAKE (1) TABLET BY MOUTH DAILY AS NEEDEDFOR MUSCLE SPASMS. 90 tablet 1 08/23/2020   zaleplon (SONATA) 10 MG capsule TAKE (1) CAPSULE BY MOUTH EVERY NIGHT AT BEDTIME AS NEEDED FOR SLEEP 30 capsule 5 08/23/2020   Semaglutide,0.25 or 0.'5MG'$ /DOS, 2 MG/1.5ML SOPN Inject 0.5 mg into the skin once a week.        Allergies  Allergen Reactions   Morphine And Related Nausea And Vomiting   Eggs Or Egg-Derived Products    Tape Hives and Rash    No [paper tape.  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    Physical Exam  Gen: Alert, oriented. Appears stated age.  HEENT: [place]/AT. PERRLA. Lungs: CTA, no wheezes. CV: RR nl S1, S2. Abd: soft, benign, no masses. BS+ Ext: No edema. Pulses 2+    Planned procedures: Proceed with EGD and colonoscopy. The patient understands the nature of the planned procedure, indications, risks, alternatives and  potential complications including but not limited to bleeding, infection, perforation, damage to internal organs and possible oversedation/side effects from anesthesia. The patient agrees and gives consent to proceed.  Please refer to procedure notes for findings, recommendations and patient disposition/instructions.     Jago Carton Tucker. Alice Reichert, M.D. Gastroenterology 08/24/2020  11:10 AM

## 2020-08-24 NOTE — Interval H&P Note (Signed)
History and Physical Interval Note:  08/24/2020 11:14 AM  Gabrielle Tucker  has presented today for surgery, with the diagnosis of RECTAL BLEED GERD.  The various methods of treatment have been discussed with the patient and family. After consideration of risks, benefits and other options for treatment, the patient has consented to  Procedure(s) with comments: COLONOSCOPY WITH PROPOFOL (N/A) - DM ESOPHAGOGASTRODUODENOSCOPY (EGD) WITH PROPOFOL (N/A) as a surgical intervention.  The patient's history has been reviewed, patient examined, no change in status, stable for surgery.  I have reviewed the patient's chart and labs.  Questions were answered to the patient's satisfaction.     [place], [place]

## 2020-08-25 ENCOUNTER — Encounter: Payer: Self-pay | Admitting: Internal Medicine

## 2020-08-25 LAB — SURGICAL PATHOLOGY

## 2020-08-30 NOTE — Anesthesia Preprocedure Evaluation (Signed)
Anesthesia Evaluation  Patient identified by MRN, date of birth, ID band Patient awake    Reviewed: Allergy & Precautions, H&P , NPO status , Patient's Chart, lab work & pertinent test results, reviewed documented beta blocker date and time   Airway Mallampati: II   Neck ROM: full    Dental  (+) Poor Dentition   Pulmonary neg pulmonary ROS,    Pulmonary exam normal        Cardiovascular Exercise Tolerance: Good hypertension, Normal cardiovascular exam+ dysrhythmias  Rhythm:regular Rate:Normal     Neuro/Psych  Headaches, PSYCHIATRIC DISORDERS Depression    GI/Hepatic Neg liver ROS, PUD,   Endo/Other  negative endocrine ROSdiabetes  Renal/GU negative Renal ROS  negative genitourinary   Musculoskeletal   Abdominal   Peds  Hematology negative hematology ROS (+)   Anesthesia Other Findings Past Medical History: No date: Depression No date: Diabetes mellitus without complication ([place]) No date: Dysrhythmia     Comment:  tachycardia No date: Gastric ulcer 12/11/2017: 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[place]) No date: Hypertension No date: Personal history of radiation therapy     Comment:  had iodine rad therapy Past Surgical History: No date: ABDOMINAL HYSTERECTOMY     Comment:  ovaries remain 2006: ANTERIOR FUSION CERVICAL SPINE No date: BREAST REDUCTION SURGERY 05/05/2019: CHOLECYSTECTOMY 08/24/2020: COLONOSCOPY WITH PROPOFOL; N/A     Comment:  Procedure: COLONOSCOPY WITH PROPOFOL;  Surgeon: [place],               Benay Pike, MD;  Location: [facility] END[facility];  Service:               Gastroenterology;  Laterality: N/A;  DM 2011: ESOPHAGOGASTRODUODENOSCOPY     Comment:  gastric ulcer 08/24/2020: ESOPHAGOGASTRODUODENOSCOPY (EGD) WITH PROPOFOL; N/A     Comment:  Procedure: ESOPHAGOGASTRODUODENOSCOPY (EGD) WITH               PROPOFOL;  Surgeon: [place], Benay Pike, MD;  Location:               [facility] END[facility]COPY;  Service:  Gastroenterology;  Laterality:               N/A; 2012: LITHOTRIPSY     Comment:  with setnet No date: LUMBAR [place] SURGERY 2015: NASAL SINUS SURGERY No date: partail knee replacement; Bilateral 2011: REDUCTION MAMMAPLASTY; Bilateral No date: REPAIR TENDONS FOOT; Left 12/16/2017: THYROIDECTOMY; Bilateral     Comment:  Procedure: THYROIDECTOMY;  Surgeon: Margaretha Sheffield, MD;                Location: [facility] ORS;  Service: ENT;  Laterality:               Bilateral; No date: TONSILLECTOMY No date: TUBAL LIGATION BMI    Body Mass Index: 37.12 kg/m     Reproductive/Obstetrics negative OB ROS                             Anesthesia Physical Anesthesia Plan  ASA: 3  Anesthesia Plan: General   Post-op Pain Management:    Induction:   PONV Risk Score and Plan:   Airway Management Planned:   Additional Equipment:   Intra-op Plan:   Post-operative Plan:   Informed Consent: I have reviewed the patients History and Physical, chart, labs and discussed the procedure including the risks, benefits and alternatives for the proposed anesthesia with the patient or authorized representative who has indicated his/her understanding and acceptance.  Dental Advisory Given  Plan Discussed with: CRNA  Anesthesia Plan Comments:         Anesthesia Quick Evaluation

## 2020-08-30 NOTE — Anesthesia Postprocedure Evaluation (Signed)
Anesthesia Post Note  Patient: Gabrielle Tucker  Procedure(s) Performed: COLONOSCOPY WITH PROPOFOL ESOPHAGOGASTRODUODENOSCOPY (EGD) WITH PROPOFOL  Patient location during evaluation: PACU Anesthesia Type: General Level of consciousness: awake and alert Pain management: pain level controlled Vital Signs Assessment: post-procedure vital signs reviewed and stable Respiratory status: spontaneous breathing, nonlabored ventilation, respiratory function stable and patient connected to nasal cannula oxygen Cardiovascular status: blood pressure returned to baseline and stable Postop Assessment: no apparent nausea or vomiting Anesthetic complications: no   No notable events documented.   Last Vitals:  Vitals:   08/24/20 1220 08/24/20 1230  BP: (!) 163/72 (!) 160/70  Pulse: 64 64  Resp: 17 17  Temp:    SpO2: 99% 97%    Last Pain:  Vitals:   08/24/20 1230  TempSrc:   PainSc: 0-No pain                 Molli Barrows

## 2020-09-02 DIAGNOSIS — G0491 Myelitis, unspecified: Secondary | ICD-10-CM | POA: Diagnosis not present

## 2020-09-02 DIAGNOSIS — M85852 Other specified disorders of bone density and structure, left thigh: Secondary | ICD-10-CM | POA: Diagnosis not present

## 2020-09-02 DIAGNOSIS — Z7952 Long term (current) use of systemic steroids: Secondary | ICD-10-CM | POA: Diagnosis not present

## 2020-09-05 ENCOUNTER — Encounter: Payer: Self-pay | Admitting: Internal Medicine

## 2020-09-05 ENCOUNTER — Telehealth: Payer: Self-pay

## 2020-09-05 DIAGNOSIS — M85859 Other specified disorders of bone density and structure, unspecified thigh: Secondary | ICD-10-CM | POA: Insufficient documentation

## 2020-09-05 NOTE — Telephone Encounter (Signed)
Noted   Waiting on fax.  KP 

## 2020-09-05 NOTE — Telephone Encounter (Signed)
Copied from [place] [id number]. Topic: General - Other >> Sep 05, 2020 10:40 AM Erick Blinks wrote: Best contact [place] called from [facility] to report that the patient has low bone density according to her dexa scan. [facility] is faxing over report now

## 2020-09-12 ENCOUNTER — Other Ambulatory Visit: Payer: Self-pay | Admitting: Internal Medicine

## 2020-09-12 DIAGNOSIS — S62225A Nondisplaced Rolando's fracture, left hand, initial encounter for closed fracture: Secondary | ICD-10-CM | POA: Diagnosis not present

## 2020-09-12 DIAGNOSIS — R000 Tachycardia, unspecified: Secondary | ICD-10-CM

## 2020-09-12 NOTE — Telephone Encounter (Signed)
   Notes to clinic:  Verify patient still taking medication for refill    Requested Prescriptions  Pending Prescriptions Disp Refills   metoprolol succinate (TOPROL-XL) 50 MG 24 hr tablet [Pharmacy Med Name: METOPROLOL SUCCINATE ER 50 MG TAB] 90 tablet 0    Sig: TAKE (1) TABLET BY MOUTH EVERY DAY     Cardiovascular:  Beta Blockers Failed - 09/12/2020 10:56 AM      Failed - Last BP in normal range    BP Readings from Last 1 Encounters:  08/24/20 (!) 160/70          Passed - Last Heart Rate in normal range    Pulse Readings from Last 1 Encounters:  08/24/20 64          Passed - Valid encounter within last 6 months    Recent Outpatient Visits           2 months ago 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

## 2020-09-13 ENCOUNTER — Encounter: Payer: Self-pay | Admitting: Internal Medicine

## 2020-09-14 NOTE — Telephone Encounter (Signed)
Please review. Pt had bone density 09/02/2020 @ [place].   KP

## 2020-09-15 DIAGNOSIS — G0491 Myelitis, unspecified: Secondary | ICD-10-CM | POA: Diagnosis not present

## 2020-09-21 DIAGNOSIS — M792 Neuralgia and neuritis, unspecified: Secondary | ICD-10-CM | POA: Diagnosis not present

## 2020-09-21 DIAGNOSIS — Z7952 Long term (current) use of systemic steroids: Secondary | ICD-10-CM | POA: Diagnosis not present

## 2020-09-21 DIAGNOSIS — M8580 Other specified disorders of bone density and structure, unspecified site: Secondary | ICD-10-CM | POA: Diagnosis not present

## 2020-09-21 DIAGNOSIS — G0491 Myelitis, unspecified: Secondary | ICD-10-CM | POA: Diagnosis not present

## 2020-09-28 DIAGNOSIS — D1801 Hemangioma of skin and subcutaneous tissue: Secondary | ICD-10-CM | POA: Diagnosis not present

## 2020-10-04 ENCOUNTER — Other Ambulatory Visit: Payer: Self-pay | Admitting: Internal Medicine

## 2020-10-04 DIAGNOSIS — Z8711 Personal history of peptic ulcer disease: Secondary | ICD-10-CM

## 2020-10-06 DIAGNOSIS — Z85850 Personal history of malignant neoplasm of thyroid: Secondary | ICD-10-CM | POA: Diagnosis not present

## 2020-10-06 DIAGNOSIS — E890 Postprocedural hypothyroidism: Secondary | ICD-10-CM | POA: Diagnosis not present

## 2020-10-06 DIAGNOSIS — E119 Type 2 diabetes mellitus without complications: Secondary | ICD-10-CM | POA: Diagnosis not present

## 2020-10-06 LAB — HEMOGLOBIN A1C: Hemoglobin A1C: 6.9

## 2020-10-12 DIAGNOSIS — Z85850 Personal history of malignant neoplasm of thyroid: Secondary | ICD-10-CM | POA: Diagnosis not present

## 2020-10-14 DIAGNOSIS — E119 Type 2 diabetes mellitus without complications: Secondary | ICD-10-CM | POA: Diagnosis not present

## 2020-10-14 DIAGNOSIS — E890 Postprocedural hypothyroidism: Secondary | ICD-10-CM | POA: Diagnosis not present

## 2020-10-14 DIAGNOSIS — Z85850 Personal history of malignant neoplasm of thyroid: Secondary | ICD-10-CM | POA: Diagnosis not present

## 2020-10-17 ENCOUNTER — Other Ambulatory Visit: Payer: Self-pay | Admitting: Internal Medicine

## 2020-10-17 ENCOUNTER — Encounter: Payer: Self-pay | Admitting: Internal Medicine

## 2020-10-19 ENCOUNTER — Ambulatory Visit: Payer: BC Managed Care – PPO | Admitting: Internal Medicine

## 2020-10-21 ENCOUNTER — Encounter: Payer: Self-pay | Admitting: Internal Medicine

## 2020-10-21 ENCOUNTER — Other Ambulatory Visit: Payer: Self-pay

## 2020-10-21 ENCOUNTER — Ambulatory Visit (INDEPENDENT_AMBULATORY_CARE_PROVIDER_SITE_OTHER): Payer: BC Managed Care – PPO | Admitting: Internal Medicine

## 2020-10-21 VITALS — BP 122/82 | HR 87 | Ht 66.0 in | Wt 241.6 lb

## 2020-10-21 DIAGNOSIS — N644 Mastodynia: Secondary | ICD-10-CM | POA: Diagnosis not present

## 2020-10-21 DIAGNOSIS — N6320 Unspecified lump in the left breast, unspecified quadrant: Secondary | ICD-10-CM

## 2020-10-21 DIAGNOSIS — Z23 Encounter for immunization: Secondary | ICD-10-CM | POA: Diagnosis not present

## 2020-10-21 DIAGNOSIS — F39 Unspecified mood [affective] disorder: Secondary | ICD-10-CM

## 2020-10-21 MED ORDER — AMOXICILLIN-POT CLAVULANATE 875-125 MG PO TABS: 1.0000 | ORAL_TABLET | Freq: Two times a day (BID) | ORAL | 0 refills | Status: AC

## 2020-10-21 NOTE — Progress Notes (Signed)
Date:  10/21/2020   Name:  Gabrielle Tucker   DOB:  1960-05-14   MRN:  [id number]   Chief Complaint: Breast Pain  Breast Pain:  Patient here today complaining of breast tenderness of the LEFT breast. Pain is located around the nipple and below nipple. Pain started 4 days ago on Monday 10/17/2020. This is a new complain. Patient last mammogram was 02/05/2019. No discharge. No injury mechanism.   Lab Results  Component Value Date   CREATININE 0.8 04/05/2020   BUN 8 04/05/2020   NA 141 04/05/2020   K 4.8 04/05/2020   CL 105 04/05/2020   CO2 31 (A) 04/05/2020   Lab Results  Component Value Date   CHOL 152 07/09/2019   HDL 41 07/09/2019   LDLCALC 81 07/09/2019   TRIG 174 (H) 07/09/2019   CHOLHDL 3.7 07/09/2019   Lab Results  Component Value Date   TSH 0.13 (A) 04/05/2020   Lab Results  Component Value Date   HGBA1C 6.9 10/06/2020   Lab Results  Component Value Date   WBC 9.6 04/05/2020   HGB 13.1 04/05/2020   HCT 41 04/05/2020   MCV 92 01/07/2019   PLT 207 04/05/2020   Lab Results  Component Value Date   ALT 30 07/09/2019   AST 34 07/09/2019   ALKPHOS 153 (H) 07/09/2019   BILITOT 0.7 07/09/2019     Review of Systems  Patient Active Problem List   Diagnosis Date Noted   Osteopenia of hip 09/05/2020   On corticosteroid therapy 08/05/2020   Status post hysterectomy 03/01/2020   Type II diabetes mellitus with complication ([place]) 53/29/9242   Mood disorder ([place]) 01/07/2019   S/P total thyroidectomy 12/16/2017   Multiple thyroid nodules 11/12/2017   Hyperlipidemia, mild 08/03/2016   Arthritis of knee, left 08/02/2016   Back muscle spasm 07/03/2014   H/O peptic ulcer 07/03/2014   Migraine without aura and responsive to treatment 07/03/2014   Muscle contraction headache 07/03/2014   Idiopathic insomnia 07/03/2014   Tachycardia 07/03/2014    Allergies  Allergen Reactions   Morphine And Related Nausea And Vomiting   Eggs Or Egg-Derived Products     Tape Hives and Rash    No [paper tape.  Tega derm OK    Past Surgical History:  Procedure Laterality Date   ABDOMINAL HYSTERECTOMY     ovaries remain   ANTERIOR FUSION CERVICAL SPINE  2006   BREAST REDUCTION SURGERY     CHOLECYSTECTOMY  05/05/2019   COLONOSCOPY WITH PROPOFOL N/A 08/24/2020   Procedure: COLONOSCOPY WITH PROPOFOL;  Surgeon: Toledo, Benay Pike, MD;  Location: [facility] END[facility];  Service: Gastroenterology;  Laterality: N/A;  DM   ESOPHAGOGASTRODUODENOSCOPY  2011   gastric ulcer   ESOPHAGOGASTRODUODENOSCOPY (EGD) WITH PROPOFOL N/A 08/24/2020   Procedure: ESOPHAGOGASTRODUODENOSCOPY (EGD) WITH PROPOFOL;  Surgeon: Toledo, Benay Pike, MD;  Location: [facility];  Service: Gastroenterology;  Laterality: N/A;   LITHOTRIPSY  2012   with setnet   LUMBAR [place]     NASAL SINUS SURGERY  2015   partail knee replacement Bilateral    REDUCTION MAMMAPLASTY Bilateral 2011   REPAIR TENDONS FOOT Left    THYROIDECTOMY Bilateral 12/16/2017   Procedure: THYROIDECTOMY;  Surgeon: Margaretha Sheffield, MD;  Location: [facility] ORS;  Service: ENT;  Laterality: Bilateral;   TONSILLECTOMY     TUBAL LIGATION      Social History   Tobacco Use   Smoking status: Never   Smokeless tobacco: Never  Vaping Use  Vaping Use: Never used  Substance Use Topics   Alcohol use: Yes    Alcohol/week: 0.0 - 1.0 standard drinks    Comment: on occassion   Drug use: Not Currently    Types: Marijuana     Medication list has been reviewed and updated.  Current Meds  Medication Sig   albuterol (VENTOLIN HFA) 108 (90 Base) MCG/ACT inhaler Inhale 2 puffs into the lungs every 6 (six) hours as needed for wheezing or shortness of breath.   alendronate (FOSAMAX) 10 MG tablet Take 10 mg by mouth daily before breakfast. Take with a full glass of water on an empty stomach.   amoxicillin-clavulanate (AUGMENTIN) 875-125 MG tablet Take 1 tablet by mouth 2 (two) times daily for 10 days.   atorvastatin (LIPITOR) 10 MG tablet  TAKE (1) TABLET BY MOUTH EVERY DAY   calcium citrate-vitamin D (CITRACAL+D) 315-200 MG-UNIT tablet Take 2 tablets by mouth 2 (two) times daily.   escitalopram (LEXAPRO) 20 MG tablet TAKE (1) TABLET BY MOUTH EVERY DAY   Fexofenadine HCl (MUCINEX ALLERGY PO) Take by mouth as needed.   fluticasone (FLONASE) 50 MCG/ACT nasal spray Place 2 sprays into both nostrils daily.   gabapentin (NEURONTIN) 300 MG capsule Take 800 mg by mouth 3 (three) times daily.   hydrOXYzine (ATARAX/VISTARIL) 25 MG tablet TK 1 TO 2 TS PO QHS   ibuprofen (ADVIL,MOTRIN) 600 MG tablet Take 1 tablet (600 mg total) by mouth every 6 (six) hours as needed.   levothyroxine (SYNTHROID) 150 MCG tablet Take 150 mcg by mouth daily.   metFORMIN (GLUCOPHAGE-XR) 500 MG 24 hr tablet Take 500 mg by mouth 2 (two) times daily.   metoprolol succinate (TOPROL-XL) 50 MG 24 hr tablet TAKE (1) TABLET BY MOUTH EVERY DAY   pantoprazole (PROTONIX) 40 MG tablet TAKE (1) TABLET BY MOUTH TWICE DAILY   predniSONE (DELTASONE) 20 MG tablet Take 25 mg by mouth daily.   Semaglutide,0.25 or 0.5MG /DOS, 2 MG/1.5ML SOPN Inject 0.5 mg into the skin once a week. OZEMPIC   sulfamethoxazole-trimethoprim (BACTRIM) 400-80 MG tablet Take 1 tablet by mouth daily.   tiZANidine (ZANAFLEX) 4 MG tablet TAKE (1) TABLET BY MOUTH DAILY AS NEEDEDFOR MUSCLE SPASMS.   zaleplon (SONATA) 10 MG capsule TAKE (1) CAPSULE BY MOUTH EVERY NIGHT AT BEDTIME AS NEEDED FOR SLEEP    PHQ 2/9 Scores 10/21/2020 06/29/2020 06/29/2020 05/24/2020  PHQ - 2 Score 0 1 0 0  PHQ- 9 Score 3 1 6  0    GAD 7 : Generalized Anxiety Score 10/21/2020 06/29/2020 06/29/2020 05/24/2020  Nervous, Anxious, on Edge 0 0 0 0  Control/stop worrying 0 0 0 0  Worry too much - different things 0 0 0 0  Trouble relaxing 0 0 0 0  Restless 0 0 0 0  Easily annoyed or irritable 0 0 0 0  Afraid - awful might happen 0 0 0 0  Total GAD 7 Score 0 0 0 0  Anxiety Difficulty Not difficult at all - - Not difficult at all    BP  Readings from Last 3 Encounters:  10/21/20 122/82  08/24/20 (!) 160/70  08/06/20 (!) 128/102    Physical Exam Constitutional:      Appearance: Normal appearance.  Cardiovascular:     Rate and Rhythm: Normal rate and regular rhythm.  Pulmonary:     Effort: Pulmonary effort is normal.     Breath sounds: No wheezing or rhonchi.  Chest:     Chest wall: No mass, swelling or  tenderness.  Breasts:    Right: Skin change (healed reduction scars) present. No mass, nipple discharge or tenderness.     Left: Mass, skin change (healed reduction scars) and tenderness present. No nipple discharge.       Comments: 0.5 cm mobile, mildly tender mass Lymphadenopathy:     Upper Body:     Right upper body: No axillary or pectoral adenopathy.     Left upper body: No axillary or pectoral adenopathy.  Neurological:     Mental Status: She is alert.    Wt Readings from Last 3 Encounters:  10/21/20 241 lb 9.6 oz (109.6 kg)  08/24/20 230 lb (104.3 kg)  08/06/20 230 lb (104.3 kg)    BP 122/82   Pulse 87   Ht 5\' 6"  (1.676 m)   Wt 241 lb 9.6 oz (109.6 kg)   SpO2 95%   BMI 39.00 kg/m   Assessment and Plan: 1. Breast pain, left With small mass - will treat for bacterial infection but also need mammogram - amoxicillin-clavulanate (AUGMENTIN) 875-125 MG tablet; Take 1 tablet by mouth 2 (two) times daily for 10 days.  Dispense: 20 tablet; Refill: 0  2. Left breast mass - US BREAST LTD UNI LEFT INC AXILLA - US BREAST LTD UNI RIGHT INC AXILLA - MM DIAG BREAST TOMO BILATERAL  3. Mood disorder ([place]) Doing well on Lexapro - continue.  4. Need for immunization against influenza - Flu Vaccine QUAD 24mo+IM (Fluarix, Fluzone & Alfiuria Quad PF)   Partially dictated using Editor, commissioning. Any errors are unintentional.  Halina Maidens, MD [facility]  10/21/2020

## 2020-10-25 ENCOUNTER — Other Ambulatory Visit: Payer: Self-pay | Admitting: Internal Medicine

## 2020-10-25 DIAGNOSIS — F39 Unspecified mood [affective] disorder: Secondary | ICD-10-CM

## 2020-10-31 ENCOUNTER — Ambulatory Visit: Admission: RE | Admit: 2020-10-31 | Payer: BC Managed Care – PPO | Source: Ambulatory Visit

## 2020-10-31 ENCOUNTER — Ambulatory Visit
Admission: RE | Admit: 2020-10-31 | Discharge: 2020-10-31 | Disposition: A | Discharge status: Home / Self Care | Payer: BC Managed Care – PPO | Source: Ambulatory Visit | Attending: Internal Medicine | Admitting: Internal Medicine

## 2020-10-31 ENCOUNTER — Other Ambulatory Visit: Payer: Self-pay

## 2020-10-31 DIAGNOSIS — R922 Inconclusive mammogram: Secondary | ICD-10-CM | POA: Diagnosis not present

## 2020-10-31 DIAGNOSIS — N6320 Unspecified lump in the left breast, unspecified quadrant: Secondary | ICD-10-CM | POA: Diagnosis not present

## 2020-10-31 DIAGNOSIS — N644 Mastodynia: Secondary | ICD-10-CM | POA: Diagnosis not present

## 2020-11-02 ENCOUNTER — Other Ambulatory Visit: Payer: Self-pay | Admitting: Internal Medicine

## 2020-11-02 DIAGNOSIS — M542 Cervicalgia: Secondary | ICD-10-CM

## 2020-11-02 NOTE — Telephone Encounter (Signed)
Requested medication (s) are due for refill today: Due 11/04/20  Requested medication (s) are on the active medication list: yes  Last refill: 05/05/20   #90  1 refill  Future visit scheduled no  Notes to clinic: Not delegated  Requested Prescriptions  Pending Prescriptions Disp Refills   tiZANidine (ZANAFLEX) 4 MG tablet [Pharmacy Med Name: TIZANIDINE HCL 4 MG TAB] 90 tablet 1    Sig: TAKE (1) TABLET BY MOUTH DAILY AS NEEDEDFOR MUSCLE SPASMS.     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

## 2020-11-09 DIAGNOSIS — M792 Neuralgia and neuritis, unspecified: Secondary | ICD-10-CM | POA: Diagnosis not present

## 2020-11-09 DIAGNOSIS — D8689 Sarcoidosis of other sites: Secondary | ICD-10-CM | POA: Diagnosis not present

## 2020-11-09 DIAGNOSIS — G0491 Myelitis, unspecified: Secondary | ICD-10-CM | POA: Diagnosis not present

## 2020-11-09 DIAGNOSIS — Z7952 Long term (current) use of systemic steroids: Secondary | ICD-10-CM | POA: Diagnosis not present

## 2020-11-17 ENCOUNTER — Other Ambulatory Visit: Payer: Self-pay | Admitting: Internal Medicine

## 2020-11-17 DIAGNOSIS — E785 Hyperlipidemia, unspecified: Secondary | ICD-10-CM

## 2020-11-17 NOTE — Telephone Encounter (Signed)
Requested medication (s) are due for refill today: Yes  Requested medication (s) are on the active medication list: Yes  Last refill:  08/15/20  Future visit scheduled: Yes  Notes to clinic:  Unable to refill per protocol due to failed labs, no updated results.      Requested Prescriptions  Pending Prescriptions Disp Refills   atorvastatin (LIPITOR) 10 MG tablet [Pharmacy Med Name: ATORVASTATIN CALCIUM 10 MG TAB] 90 tablet 0    Sig: TAKE (1) TABLET BY MOUTH EVERY DAY     Cardiovascular:  Antilipid - Statins Failed - 11/17/2020  8:33 AM      Failed - Total Cholesterol in normal range and within 360 days    Cholesterol, Total  Date Value Ref Range Status  07/09/2019 152 100 - 199 mg/dL Final          Failed - LDL in normal range and within 360 days    LDL Chol Calc (NIH)  Date Value Ref Range Status  07/09/2019 81 0 - 99 mg/dL Final          Failed - HDL in normal range and within 360 days    HDL  Date Value Ref Range Status  07/09/2019 41 >39 mg/dL Final          Failed - Triglycerides in normal range and within 360 days    Triglycerides  Date Value Ref Range Status  07/09/2019 174 (H) 0 - 149 mg/dL Final          Passed - Patient is not pregnant      Passed - Valid encounter within last 12 months    Recent Outpatient Visits           3 weeks ago Breast pain, left   [facility] Glean Hess, MD   4 months ago 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

## 2020-11-23 ENCOUNTER — Encounter: Payer: Self-pay | Admitting: Internal Medicine

## 2020-12-05 DIAGNOSIS — Z7952 Long term (current) use of systemic steroids: Secondary | ICD-10-CM | POA: Diagnosis not present

## 2020-12-05 DIAGNOSIS — M5126 Other intervertebral disc displacement, lumbar region: Secondary | ICD-10-CM | POA: Diagnosis not present

## 2020-12-05 DIAGNOSIS — G0491 Myelitis, unspecified: Secondary | ICD-10-CM | POA: Diagnosis not present

## 2020-12-05 DIAGNOSIS — M47816 Spondylosis without myelopathy or radiculopathy, lumbar region: Secondary | ICD-10-CM | POA: Diagnosis not present

## 2020-12-06 ENCOUNTER — Other Ambulatory Visit: Payer: Self-pay

## 2020-12-06 ENCOUNTER — Ambulatory Visit (INDEPENDENT_AMBULATORY_CARE_PROVIDER_SITE_OTHER): Payer: BC Managed Care – PPO | Admitting: Internal Medicine

## 2020-12-06 ENCOUNTER — Encounter: Payer: Self-pay | Admitting: Internal Medicine

## 2020-12-06 VITALS — BP 128/64 | HR 102 | Temp 98.2°F | Ht 66.0 in | Wt 244.0 lb

## 2020-12-06 DIAGNOSIS — J0100 Acute maxillary sinusitis, unspecified: Secondary | ICD-10-CM | POA: Diagnosis not present

## 2020-12-06 DIAGNOSIS — R062 Wheezing: Secondary | ICD-10-CM

## 2020-12-06 MED ORDER — AMOXICILLIN-POT CLAVULANATE 875-125 MG PO TABS: 1.0000 | ORAL_TABLET | Freq: Two times a day (BID) | ORAL | 0 refills | Status: AC

## 2020-12-06 MED ORDER — ALBUTEROL SULFATE HFA 108 (90 BASE) MCG/ACT IN AERS: 2.0000 | INHALATION_SPRAY | Freq: Four times a day (QID) | RESPIRATORY_TRACT | 0 refills | Status: DC | PRN

## 2020-12-06 NOTE — Patient Instructions (Signed)
Claritin 10 mg daily  Stop Mucinex  Continue Flonase

## 2020-12-06 NOTE — Progress Notes (Signed)
Date:  12/06/2020   Name:  Gabrielle Tucker   DOB:  1960/04/20   MRN:  [id number]   Chief Complaint: Cough  Cough This is a new problem. The current episode started 1 to 4 weeks ago. The cough is Non-productive. Associated symptoms include headaches and wheezing. Pertinent negatives include no chest pain, chills, fever, myalgias, rhinorrhea, sore throat or shortness of breath.   Lab Results  Component Value Date   CREATININE 0.8 04/05/2020   BUN 8 04/05/2020   NA 141 04/05/2020   K 4.8 04/05/2020   CL 105 04/05/2020   CO2 31 (A) 04/05/2020   Lab Results  Component Value Date   CHOL 152 07/09/2019   HDL 41 07/09/2019   LDLCALC 81 07/09/2019   TRIG 174 (H) 07/09/2019   CHOLHDL 3.7 07/09/2019   Lab Results  Component Value Date   TSH 0.13 (A) 04/05/2020   Lab Results  Component Value Date   HGBA1C 6.9 10/06/2020   Lab Results  Component Value Date   WBC 9.6 04/05/2020   HGB 13.1 04/05/2020   HCT 41 04/05/2020   MCV 92 01/07/2019   PLT 207 04/05/2020   Lab Results  Component Value Date   ALT 30 07/09/2019   AST 34 07/09/2019   ALKPHOS 153 (H) 07/09/2019   BILITOT 0.7 07/09/2019     Review of Systems  Constitutional:  Negative for chills, fatigue and fever.  HENT:  Positive for congestion and sinus pressure. Negative for rhinorrhea and sore throat.   Respiratory:  Positive for cough and wheezing. Negative for chest tightness and shortness of breath.   Cardiovascular:  Negative for chest pain and palpitations.  Gastrointestinal:  Negative for abdominal pain.  Musculoskeletal:  Negative for myalgias.  Neurological:  Positive for headaches.   Patient Active Problem List   Diagnosis Date Noted   Osteopenia of hip 09/05/2020   On corticosteroid therapy 08/05/2020   Status post hysterectomy 03/01/2020   Type II diabetes mellitus with complication ([place]) 47/82/9562   Mood disorder ([place]) 01/07/2019   S/P total thyroidectomy 12/16/2017   Multiple thyroid  nodules 11/12/2017   Hyperlipidemia, mild 08/03/2016   Arthritis of knee, left 08/02/2016   Back muscle spasm 07/03/2014   H/O peptic ulcer 07/03/2014   Migraine without aura and responsive to treatment 07/03/2014   Muscle contraction headache 07/03/2014   Idiopathic insomnia 07/03/2014   Tachycardia 07/03/2014    Allergies  Allergen Reactions   Morphine And Related Nausea And Vomiting   Eggs Or Egg-Derived Products    Tape Hives and Rash    No [paper tape.  Tega derm OK    Past Surgical History:  Procedure Laterality Date   ABDOMINAL HYSTERECTOMY     ovaries remain   ANTERIOR FUSION CERVICAL SPINE  2006   BREAST REDUCTION SURGERY     CHOLECYSTECTOMY  05/05/2019   COLONOSCOPY WITH PROPOFOL N/A 08/24/2020   Procedure: COLONOSCOPY WITH PROPOFOL;  Surgeon: Toledo, Benay Pike, MD;  Location: [facility] END[facility]COPY;  Service: Gastroenterology;  Laterality: N/A;  DM   ESOPHAGOGASTRODUODENOSCOPY  2011   gastric ulcer   ESOPHAGOGASTRODUODENOSCOPY (EGD) WITH PROPOFOL N/A 08/24/2020   Procedure: ESOPHAGOGASTRODUODENOSCOPY (EGD) WITH PROPOFOL;  Surgeon: Toledo, Benay Pike, MD;  Location: [facility] END[facility];  Service: Gastroenterology;  Laterality: N/A;   LITHOTRIPSY  2012   with setnet   LUMBAR [place]     NASAL SINUS SURGERY  2015   partail knee replacement Bilateral    REDUCTION MAMMAPLASTY Bilateral 2011  REPAIR TENDONS FOOT Left    THYROIDECTOMY Bilateral 12/16/2017   Procedure: THYROIDECTOMY;  Surgeon: Margaretha Sheffield, MD;  Location: [facility] ORS;  Service: ENT;  Laterality: Bilateral;   TONSILLECTOMY     TUBAL LIGATION      Social History   Tobacco Use   Smoking status: Never   Smokeless tobacco: Never  Vaping Use   Vaping Use: Never used  Substance Use Topics   Alcohol use: Yes    Alcohol/week: 0.0 - 1.0 standard drinks    Comment: on occassion   Drug use: Not Currently    Types: Marijuana     Medication list has been reviewed and updated.  Current Meds  Medication Sig    albuterol (VENTOLIN HFA) 108 (90 Base) MCG/ACT inhaler Inhale 2 puffs into the lungs every 6 (six) hours as needed for wheezing or shortness of breath.   alendronate (FOSAMAX) 10 MG tablet Take 10 mg by mouth daily before breakfast. Take with a full glass of water on an empty stomach.   atorvastatin (LIPITOR) 10 MG tablet TAKE (1) TABLET BY MOUTH EVERY DAY   calcium citrate-vitamin D (CITRACAL+D) 315-200 MG-UNIT tablet Take 2 tablets by mouth 2 (two) times daily.   escitalopram (LEXAPRO) 20 MG tablet TAKE (1) TABLET BY MOUTH EVERY DAY   Fexofenadine HCl (MUCINEX ALLERGY PO) Take by mouth as needed.   fluticasone (FLONASE) 50 MCG/ACT nasal spray Place 2 sprays into both nostrils daily.   gabapentin (NEURONTIN) 100 MG capsule Take 100 mg by mouth 3 (three) times daily.   hydrOXYzine (ATARAX/VISTARIL) 25 MG tablet TK 1 TO 2 TS PO QHS   ibuprofen (ADVIL,MOTRIN) 600 MG tablet Take 1 tablet (600 mg total) by mouth every 6 (six) hours as needed.   levothyroxine (SYNTHROID) 150 MCG tablet Take 150 mcg by mouth daily.   metFORMIN (GLUCOPHAGE-XR) 500 MG 24 hr tablet Take 500 mg by mouth 2 (two) times daily.   metoprolol succinate (TOPROL-XL) 50 MG 24 hr tablet TAKE (1) TABLET BY MOUTH EVERY DAY   pantoprazole (PROTONIX) 40 MG tablet TAKE (1) TABLET BY MOUTH TWICE DAILY   predniSONE (DELTASONE) 20 MG tablet Take 25 mg by mouth daily.   Semaglutide,0.25 or 0.5MG /DOS, 2 MG/1.5ML SOPN Inject 0.5 mg into the skin once a week. OZEMPIC   sulfamethoxazole-trimethoprim (BACTRIM) 400-80 MG tablet Take 1 tablet by mouth 2 (two) times daily.   tiZANidine (ZANAFLEX) 4 MG tablet TAKE (1) TABLET BY MOUTH DAILY AS NEEDEDFOR MUSCLE SPASMS.   valACYclovir (VALTREX) 1000 MG tablet Take 1,000 mg by mouth daily.   zaleplon (SONATA) 10 MG capsule TAKE (1) CAPSULE BY MOUTH EVERY NIGHT AT BEDTIME AS NEEDED FOR SLEEP    PHQ 2/9 Scores 12/06/2020 10/21/2020 06/29/2020 06/29/2020  PHQ - 2 Score 0 0 1 0  PHQ- 9 Score 0 3 1 6     GAD  7 : Generalized Anxiety Score 12/06/2020 10/21/2020 06/29/2020 06/29/2020  Nervous, Anxious, on Edge 0 0 0 0  Control/stop worrying 0 0 0 0  Worry too much - different things 0 0 0 0  Trouble relaxing 0 0 0 0  Restless 0 0 0 0  Easily annoyed or irritable 0 0 0 0  Afraid - awful might happen 0 0 0 0  Total GAD 7 Score 0 0 0 0  Anxiety Difficulty Not difficult at all Not difficult at all - -    BP Readings from Last 3 Encounters:  12/06/20 128/64  10/21/20 122/82  08/24/20 (!) 160/70  Physical Exam Constitutional:      Appearance: She is well-developed.  HENT:     Head: Normocephalic and atraumatic.     Right Ear: Ear canal and external ear normal. Tympanic membrane is erythematous and retracted.     Left Ear: Ear canal and external ear normal. Tympanic membrane is erythematous and retracted.     Nose:     Right Sinus: Maxillary sinus tenderness and frontal sinus tenderness present.     Left Sinus: Maxillary sinus tenderness and frontal sinus tenderness present.     Mouth/Throat:     Mouth: No oral lesions.     Pharynx: Uvula midline. Posterior oropharyngeal erythema present. No oropharyngeal exudate.  Cardiovascular:     Rate and Rhythm: Normal rate and regular rhythm.     Heart sounds: Normal heart sounds.  Pulmonary:     Breath sounds: Normal breath sounds. No decreased breath sounds, wheezing, rhonchi or rales.  Lymphadenopathy:     Cervical: No cervical adenopathy.  Neurological:     Mental Status: She is alert and oriented to person, place, and time.    Wt Readings from Last 3 Encounters:  12/06/20 244 lb (110.7 kg)  10/21/20 241 lb 9.6 oz (109.6 kg)  08/24/20 230 lb (104.3 kg)    BP 128/64   Pulse (!) 102   Temp 98.2 F (36.8 C) (Oral)   Ht 5\' 6"  (1.676 m)   Wt 244 lb (110.7 kg)   SpO2 96%   BMI 39.38 kg/m   Assessment and Plan: 1. Acute non-recurrent maxillary sinusitis Continue Flonase spray Stop Mucinex Start Claritin - amoxicillin-clavulanate  (AUGMENTIN) 875-125 MG tablet; Take 1 tablet by mouth 2 (two) times daily for 10 days.  Dispense: 20 tablet; Refill: 0  2. Cough - albuterol (VENTOLIN HFA) 108 (90 Base) MCG/ACT inhaler; Inhale 2 puffs into the lungs every 6 (six) hours as needed for wheezing or shortness of breath.  Dispense: 1 each; Refill: 0   Partially dictated using Editor, commissioning. Any errors are unintentional.  Halina Maidens, MD [facility]  12/06/2020

## 2020-12-19 ENCOUNTER — Other Ambulatory Visit: Payer: Self-pay | Admitting: Internal Medicine

## 2020-12-19 DIAGNOSIS — F5101 Primary insomnia: Secondary | ICD-10-CM

## 2020-12-19 DIAGNOSIS — R000 Tachycardia, unspecified: Secondary | ICD-10-CM

## 2020-12-20 ENCOUNTER — Other Ambulatory Visit: Payer: Self-pay | Admitting: Internal Medicine

## 2020-12-20 NOTE — Telephone Encounter (Signed)
Requested medications are due for refill today yes  Requested medications are on the active medication list yes  Last refill 09/12/20 and 11/26/20  Last visit Do not see either dx/meds (insomnia and tachycardia) addressed in OV note  Future visit scheduled no  Notes to clinic Sonata not delegated, Toprol XL not addressed in OV note, please assess. Requested Prescriptions  Pending Prescriptions Disp Refills   metoprolol succinate (TOPROL-XL) 50 MG 24 hr tablet [Pharmacy Med Name: METOPROLOL SUCCINATE ER 50 MG TAB] 90 tablet 0    Sig: TAKE (1) TABLET BY MOUTH EVERY DAY     Cardiovascular:  Beta Blockers Passed - 12/19/2020  9:24 AM      Passed - Last BP in normal range    BP Readings from Last 1 Encounters:  12/06/20 128/64          Passed - Last Heart Rate in normal range    Pulse Readings from Last 1 Encounters:  12/06/20 (!) 102          Passed - Valid encounter within last 6 months    Recent Outpatient Visits           2 weeks ago Acute non-recurrent maxillary sinusitis   [facility] Glean Hess, MD   2 months ago Breast pain, left   [facility] Glean Hess, MD   5 months ago 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   [facility] Glean Hess, MD   7 months ago H/O peptic ulcer   [facility] Glean Hess, MD   9 months ago Suspected COVID-19 virus infection   [facility] Glean Hess, MD               zaleplon (SONATA) 10 MG capsule [Pharmacy Med Name: ZALEPLON 10 MG CAP] 30 capsule     Sig: TAKE (1) CAPSULE BY MOUTH EVERY NIGHT AT BEDTIME AS NEEDED FOR SLEEP     Not Delegated - Psychiatry:  Anxiolytics/Hypnotics Failed - 12/19/2020  9:24 AM      Failed - This refill cannot be delegated      Failed - Urine Drug Screen completed in last 360 days      Passed - Valid encounter within last 6 months    Recent Outpatient Visits           2 weeks ago Acute non-recurrent maxillary sinusitis   [facility] Glean Hess, MD   2 months ago Breast pain, left   [facility] Glean Hess, MD   5 months ago 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

## 2020-12-20 NOTE — Telephone Encounter (Signed)
Please review. Last office visit 12/06/2020.  KP

## 2020-12-28 DIAGNOSIS — D84821 Immunodeficiency due to drugs: Secondary | ICD-10-CM | POA: Diagnosis not present

## 2020-12-28 DIAGNOSIS — M792 Neuralgia and neuritis, unspecified: Secondary | ICD-10-CM | POA: Diagnosis not present

## 2020-12-28 DIAGNOSIS — Z7952 Long term (current) use of systemic steroids: Secondary | ICD-10-CM | POA: Diagnosis not present

## 2020-12-28 DIAGNOSIS — Z79899 Other long term (current) drug therapy: Secondary | ICD-10-CM | POA: Diagnosis not present

## 2020-12-28 DIAGNOSIS — Z79631 Long term (current) use of antimetabolite agent: Secondary | ICD-10-CM | POA: Diagnosis not present

## 2020-12-28 DIAGNOSIS — Z5181 Encounter for therapeutic drug level monitoring: Secondary | ICD-10-CM | POA: Diagnosis not present

## 2020-12-28 DIAGNOSIS — G0491 Myelitis, unspecified: Secondary | ICD-10-CM | POA: Diagnosis not present

## 2020-12-28 LAB — CBC AND DIFFERENTIAL
HCT: 40 (ref 36–46)
Hemoglobin: 13.6 (ref 12.0–16.0)
Platelets: 267 (ref 150–399)
WBC: 16.6

## 2020-12-28 LAB — CBC: RBC: 4.46 (ref 3.87–5.11)

## 2020-12-30 ENCOUNTER — Encounter: Payer: Self-pay | Admitting: Internal Medicine

## 2021-01-11 DIAGNOSIS — G0491 Myelitis, unspecified: Secondary | ICD-10-CM | POA: Diagnosis not present

## 2021-01-11 DIAGNOSIS — R160 Hepatomegaly, not elsewhere classified: Secondary | ICD-10-CM | POA: Diagnosis not present

## 2021-01-11 DIAGNOSIS — D869 Sarcoidosis, unspecified: Secondary | ICD-10-CM | POA: Diagnosis not present

## 2021-01-11 DIAGNOSIS — K760 Fatty (change of) liver, not elsewhere classified: Secondary | ICD-10-CM | POA: Diagnosis not present

## 2021-01-17 ENCOUNTER — Telehealth: Payer: BC Managed Care – PPO | Admitting: Nurse Practitioner

## 2021-01-17 DIAGNOSIS — J069 Acute upper respiratory infection, unspecified: Secondary | ICD-10-CM | POA: Diagnosis not present

## 2021-01-17 DIAGNOSIS — J0110 Acute frontal sinusitis, unspecified: Secondary | ICD-10-CM

## 2021-01-17 MED ORDER — AMOXICILLIN-POT CLAVULANATE 875-125 MG PO TABS: 1.0000 | ORAL_TABLET | Freq: Two times a day (BID) | ORAL | 0 refills | Status: AC

## 2021-01-17 NOTE — Progress Notes (Signed)
Virtual Visit Consent   Gabrielle Tucker, you are scheduled for a virtual visit with a [place] provider today.     Just as with appointments in the office, your consent must be obtained to participate.  Your consent will be active for this visit and any virtual visit you may have with one of our providers in the next 365 days.     If you have a MyChart account, a copy of this consent can be sent to you electronically.  All virtual visits are billed to your insurance company just like a traditional visit in the office.    As this is a virtual visit, video technology does not allow for your provider to perform a traditional examination.  This may limit your provider's ability to fully assess your condition.  If your provider identifies any concerns that need to be evaluated in person or the need to arrange testing (such as labs, EKG, etc.), we will make arrangements to do so.     Although advances in technology are sophisticated, we cannot ensure that it will always work on either your end or our end.  If the connection with a video visit is poor, the visit may have to be switched to a telephone visit.  With either a video or telephone visit, we are not always able to ensure that we have a secure connection.     I need to obtain your verbal consent now.   Are you willing to proceed with your visit today?    Gabrielle Tucker has provided verbal consent on 01/17/2021 for a virtual visit (video or telephone).   Apolonio Schneiders, FNP   Date: 01/17/2021 12:47 PM   Virtual Visit via Video Note   I, Apolonio Schneiders, connected with  Gabrielle Tucker  ([id number], 09-18-1960) on 01/17/21 at 12:45 PM EST by a video-enabled telemedicine application and verified that I am speaking with the correct person using two identifiers.  Location: Patient: Virtual Visit Location Patient: Home Provider: Virtual Visit Location Provider: Home Office   I discussed the limitations of evaluation and management by  telemedicine and the availability of in person appointments. The patient expressed understanding and agreed to proceed.    History of Present Illness: Gabrielle Tucker is a 60 y.o. who identifies as a female who was assigned female at birth, and is being seen today with complaints of sinus pressure and a productive cough with low grade fever.   She has been sick for the past few days.   She did take a home COVID test and it was negative.   She has been using Flonase over the counter for relief and tylenol.   She denies a history of high blood pressure  She has had her flu vaccine this year   She denies a history of asthma  She has used an inhaler in the past for URIs She denies any tightness in chest or wheezing.  She feels that her head is worse than her chest and cough today.   She does take prednisone daily   Problems:  Patient Active Problem List   Diagnosis Date Noted   Osteopenia of hip 09/05/2020   On corticosteroid therapy 08/05/2020   Status post hysterectomy 03/01/2020   Type II diabetes mellitus with complication ([place]) 90/24/0973   Mood disorder ([place]) 01/07/2019   S/P total thyroidectomy 12/16/2017   Multiple thyroid nodules 11/12/2017   Hyperlipidemia, mild 08/03/2016   Arthritis of knee, left 08/02/2016   Back  muscle spasm 07/03/2014   H/O peptic ulcer 07/03/2014   Migraine without aura and responsive to treatment 07/03/2014   Muscle contraction headache 07/03/2014   Idiopathic insomnia 07/03/2014   Tachycardia 07/03/2014    Allergies:  Allergies  Allergen Reactions   Morphine And Related Nausea And Vomiting   Eggs Or Egg-Derived Products    Tape Hives and Rash    No [paper tape.  Tega derm OK   Medications:  Current Outpatient Medications:    albuterol (VENTOLIN HFA) 108 (90 Base) MCG/ACT inhaler, Inhale 2 puffs into the lungs every 6 (six) hours as needed for wheezing or shortness of breath., Disp: 1 each, Rfl: 0   alendronate (FOSAMAX) 35 MG  tablet, Take 35 mg by mouth once a week., Disp: , Rfl:    atorvastatin (LIPITOR) 10 MG tablet, TAKE (1) TABLET BY MOUTH EVERY DAY, Disp: 90 tablet, Rfl: 0   calcium citrate-vitamin D (CITRACAL+D) 315-200 MG-UNIT tablet, Take 2 tablets by mouth 2 (two) times daily., Disp: 360 tablet, Rfl: 1   escitalopram (LEXAPRO) 20 MG tablet, TAKE (1) TABLET BY MOUTH EVERY DAY, Disp: 90 tablet, Rfl: 0   Fexofenadine HCl (MUCINEX ALLERGY PO), Take by mouth as needed., Disp: , Rfl:    fluticasone (FLONASE) 50 MCG/ACT nasal spray, Place 2 sprays into both nostrils daily., Disp: 16 g, Rfl: 1   gabapentin (NEURONTIN) 100 MG capsule, Take 900 mg by mouth 3 (three) times daily., Disp: , Rfl:    gabapentin (NEURONTIN) 400 MG capsule, Take by mouth., Disp: , Rfl:    hydrOXYzine (ATARAX/VISTARIL) 25 MG tablet, TK 1 TO 2 TS PO QHS, Disp: , Rfl: 1   ibuprofen (ADVIL,MOTRIN) 600 MG tablet, Take 1 tablet (600 mg total) by mouth every 6 (six) hours as needed., Disp: 30 tablet, Rfl: 0   levothyroxine (SYNTHROID) 150 MCG tablet, Take 150 mcg by mouth daily., Disp: , Rfl:    metFORMIN (GLUCOPHAGE-XR) 500 MG 24 hr tablet, Take 500 mg by mouth 2 (two) times daily., Disp: , Rfl:    metoprolol succinate (TOPROL-XL) 50 MG 24 hr tablet, TAKE (1) TABLET BY MOUTH EVERY DAY, Disp: 90 tablet, Rfl: 0   pantoprazole (PROTONIX) 40 MG tablet, TAKE (1) TABLET BY MOUTH TWICE DAILY, Disp: 180 tablet, Rfl: 1   predniSONE (DELTASONE) 20 MG tablet, Take 25 mg by mouth daily., Disp: , Rfl:    Semaglutide,0.25 or 0.5MG /DOS, 2 MG/1.5ML SOPN, Inject 0.5 mg into the skin once a week. OZEMPIC, Disp: , Rfl:    sulfamethoxazole-trimethoprim (BACTRIM) 400-80 MG tablet, Take 1 tablet by mouth 2 (two) times daily., Disp: , Rfl:    tiZANidine (ZANAFLEX) 4 MG tablet, TAKE (1) TABLET BY MOUTH DAILY AS NEEDEDFOR MUSCLE SPASMS., Disp: 90 tablet, Rfl: 0   valACYclovir (VALTREX) 1000 MG tablet, Take 1,000 mg by mouth daily., Disp: , Rfl:    zaleplon (SONATA) 10 MG  capsule, TAKE (1) CAPSULE BY MOUTH EVERY NIGHT AT BEDTIME AS NEEDED FOR SLEEP, Disp: 30 capsule, Rfl: 2  Observations/Objective: Patient is well-developed, well-nourished in no acute distress.  Resting comfortably at home.  Head is normocephalic, atraumatic.  No labored breathing.  Speech is clear and coherent with logical content.  Patient is alert and oriented at baseline.    Assessment and Plan: 1. Viral upper respiratory tract infection Discussed the normal course of viral infection, patient is on daily prednisone which may lead to decreased immune response. She will use OTC decongestants and Flonase for another 48 hours. If no improvement  in symptoms she will start antibiotic as discussed   2. Acute non-recurrent frontal sinusitis If starting take with food as discussed  Watch and wait Rx to be started if no improvement in the next 48 hours with OTC management: - amoxicillin-clavulanate (AUGMENTIN) 875-125 MG tablet; Take 1 tablet by mouth 2 (two) times daily for 7 days. Take with food  Dispense: 14 tablet; Refill: 0     Follow Up Instructions: I discussed the assessment and treatment plan with the patient. The patient was provided an opportunity to ask questions and all were answered. The patient agreed with the plan and demonstrated an understanding of the instructions.  A copy of instructions were sent to the patient via MyChart unless otherwise noted below.     The patient was advised to call back or seek an in-person evaluation if the symptoms worsen or if the condition fails to improve as anticipated.  Time:  I spent 15 minutes with the patient via telehealth technology discussing the above problems/concerns.    Apolonio Schneiders, FNP

## 2021-01-18 ENCOUNTER — Other Ambulatory Visit: Payer: Self-pay | Admitting: Internal Medicine

## 2021-01-18 DIAGNOSIS — M542 Cervicalgia: Secondary | ICD-10-CM

## 2021-01-18 NOTE — Telephone Encounter (Signed)
Requested medication (s) are due for refill today:   Provider to review  Requested medication (s) are on the active medication list:   Yes  Future visit scheduled:   Yes   Last ordered: 11/02/2020 #90, 0  Non delegated refill   Requested Prescriptions  Pending Prescriptions Disp Refills   tiZANidine (ZANAFLEX) 4 MG tablet [Pharmacy Med Name: TIZANIDINE HCL 4 MG TAB] 90 tablet 0    Sig: TAKE (1) TABLET BY MOUTH DAILY AS NEEDEDFOR MUSCLE SPASMS.     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

## 2021-01-30 ENCOUNTER — Encounter: Payer: Self-pay | Admitting: Internal Medicine

## 2021-01-30 ENCOUNTER — Other Ambulatory Visit: Payer: Self-pay

## 2021-01-30 DIAGNOSIS — E118 Type 2 diabetes mellitus with unspecified complications: Secondary | ICD-10-CM

## 2021-02-15 ENCOUNTER — Other Ambulatory Visit: Payer: Self-pay | Admitting: Internal Medicine

## 2021-02-15 DIAGNOSIS — E785 Hyperlipidemia, unspecified: Secondary | ICD-10-CM

## 2021-02-15 DIAGNOSIS — F39 Unspecified mood [affective] disorder: Secondary | ICD-10-CM

## 2021-02-15 NOTE — Telephone Encounter (Signed)
Requested medication (s) are due for refill today:   Yes for both  Requested medication (s) are on the active medication list:   Yes for both  Future visit scheduled:   Yes   Last ordered: Lexapro 10/25/2020 #90, 0 refills;  atorvastatin 11/17/2020 #90, 0 refills  Returned because protocol failed due to lab work being needed.     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   [facility] Glean Hess, MD   8 months ago H/O peptic ulcer   [facility] Glean Hess, MD   11 months ago Suspected COVID-19 virus infection   [place], MD       Future Appointments             In 1 month Army Melia, Jesse Sans, MD [facility]             atorvastatin (LIPITOR) 10 MG tablet [Pharmacy Med Name: ATORVASTATIN CALCIUM 10 MG TAB] 90 tablet 0    Sig: TAKE (1) TABLET BY MOUTH EVERY DAY     Cardiovascular:  Antilipid - Statins Failed - 02/15/2021  8:23 AM      Failed - Total Cholesterol in normal range and within 360 days    Cholesterol, Total  Date Value Ref Range Status  07/09/2019 152 100 - 199 mg/dL Final          Failed - LDL in normal range and within 360 days    LDL Chol Calc (NIH)  Date Value Ref Range Status  07/09/2019 81 0 - 99 mg/dL Final          Failed - HDL in normal range and within 360 days    HDL  Date Value Ref Range Status  07/09/2019 41 >39 mg/dL Final           Failed - Triglycerides in normal range and within 360 days    Triglycerides  Date Value Ref Range Status  07/09/2019 174 (H) 0 - 149 mg/dL Final          Passed - Patient is not pregnant      Passed - Valid encounter within last 12 months    Recent Outpatient Visits           2 months ago Acute non-recurrent maxillary sinusitis   [facility] Glean Hess, MD   3 months ago Breast pain, left   [facility] Glean Hess, MD   7 months ago 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

## 2021-02-27 LAB — COMPREHENSIVE METABOLIC PANEL: eGFR: 90

## 2021-02-27 LAB — HEMOGLOBIN A1C: Hemoglobin A1C: 7.1

## 2021-02-27 LAB — PROTEIN / CREATININE RATIO, URINE
Albumin, U: 1.4
Creatinine, Urine: 123

## 2021-02-27 LAB — MICROALBUMIN / CREATININE URINE RATIO
Microalb Creat Ratio: 11.4
Microalb Creat Ratio: NORMAL

## 2021-03-20 ENCOUNTER — Ambulatory Visit: Payer: Self-pay | Admitting: Internal Medicine

## 2021-03-22 ENCOUNTER — Other Ambulatory Visit: Payer: Self-pay

## 2021-03-22 ENCOUNTER — Ambulatory Visit: Payer: BLUE CROSS/BLUE SHIELD | Admitting: Internal Medicine

## 2021-03-22 ENCOUNTER — Encounter: Payer: Self-pay | Admitting: Internal Medicine

## 2021-03-22 VITALS — BP 122/78 | HR 94 | Ht 66.0 in | Wt 245.0 lb

## 2021-03-22 DIAGNOSIS — R030 Elevated blood-pressure reading, without diagnosis of hypertension: Secondary | ICD-10-CM

## 2021-03-22 DIAGNOSIS — E890 Postprocedural hypothyroidism: Secondary | ICD-10-CM

## 2021-03-22 DIAGNOSIS — F39 Unspecified mood [affective] disorder: Secondary | ICD-10-CM

## 2021-03-22 DIAGNOSIS — D8689 Sarcoidosis of other sites: Secondary | ICD-10-CM

## 2021-03-22 DIAGNOSIS — E785 Hyperlipidemia, unspecified: Secondary | ICD-10-CM

## 2021-03-22 DIAGNOSIS — E118 Type 2 diabetes mellitus with unspecified complications: Secondary | ICD-10-CM

## 2021-03-22 MED ORDER — ESCITALOPRAM OXALATE 10 MG PO TABS: 30.0000 mg | ORAL_TABLET | Freq: Every day | ORAL | 0 refills | Status: DC

## 2021-03-22 MED ORDER — HYDROCHLOROTHIAZIDE 25 MG PO TABS: 25.0000 mg | ORAL_TABLET | Freq: Every day | ORAL | 0 refills | Status: DC

## 2021-03-22 NOTE — Progress Notes (Signed)
Date:  03/22/2021   Name:  Gabrielle Tucker   DOB:  12/16/1960   MRN:  [id number]   Chief Complaint: Hypertension  Diabetes She presents for her follow-up diabetic visit. She has type 2 diabetes mellitus. Her disease course has been worsening. Hypoglycemia symptoms include nervousness/anxiousness. Pertinent negatives for diabetes include no chest pain and no fatigue. Current diabetic treatment includes oral agent (monotherapy) (metformin and trying to get Pt assist for Jardiance).  Hyperlipidemia This is a chronic problem. Associated symptoms include myalgias and shortness of breath. Pertinent negatives include no chest pain. Current antihyperlipidemic treatment includes statins. The current treatment provides significant improvement of lipids.  Hypertension This is a new problem. The problem has been waxing and waning since onset. Associated symptoms include peripheral edema and shortness of breath. Pertinent negatives include no chest pain or palpitations. Agents associated with hypertension include steroids. Risk factors for coronary artery disease include diabetes mellitus and dyslipidemia. Past treatments include nothing.   Lab Results  Component Value Date   NA 141 04/05/2020   K 4.8 04/05/2020   CO2 31 (A) 04/05/2020   GLUCOSE 109 (H) 03/18/2019   BUN 8 04/05/2020   CREATININE 0.8 04/05/2020   CALCIUM 9.2 04/05/2020   GFRNONAA 92 03/18/2019   Lab Results  Component Value Date   CHOL 152 07/09/2019   HDL 41 07/09/2019   LDLCALC 81 07/09/2019   TRIG 174 (H) 07/09/2019   CHOLHDL 3.7 07/09/2019   Lab Results  Component Value Date   TSH 0.13 (A) 04/05/2020   Lab Results  Component Value Date   HGBA1C 7.1 02/27/2021   Lab Results  Component Value Date   WBC 16.6 12/28/2020   HGB 13.6 12/28/2020   HCT 40 12/28/2020   MCV 92 01/07/2019   PLT 267 12/28/2020   Lab Results  Component Value Date   ALT 30 07/09/2019   AST 34 07/09/2019   ALKPHOS 153 (H) 07/09/2019    BILITOT 0.7 07/09/2019   No results found for: 25OHVITD2, 25OHVITD3, VD25OH   Review of Systems  Constitutional:  Positive for diaphoresis and unexpected weight change. Negative for chills and fatigue.  HENT:  Negative for trouble swallowing.   Respiratory:  Positive for shortness of breath. Negative for chest tightness and wheezing.   Cardiovascular:  Positive for leg swelling. Negative for chest pain and palpitations.  Gastrointestinal:  Negative for constipation and diarrhea.  Musculoskeletal:  Positive for arthralgias, gait problem and myalgias.  Psychiatric/Behavioral:  Positive for dysphoric mood. Negative for sleep disturbance and suicidal ideas. The patient is nervous/anxious.    Patient Active Problem List   Diagnosis Date Noted   Post-surgical hypothyroidism 03/22/2021   Osteopenia of hip 09/05/2020   On corticosteroid therapy 08/05/2020   Status post hysterectomy 03/01/2020   Type II diabetes mellitus with complication ([place]) 45/80/9983   Mood disorder ([place]) 01/07/2019   S/P total thyroidectomy 12/16/2017   Multiple thyroid nodules 11/12/2017   Hyperlipidemia, mild 08/03/2016   Arthritis of knee, left 08/02/2016   Back muscle spasm 07/03/2014   H/O peptic ulcer 07/03/2014   Migraine without aura and responsive to treatment 07/03/2014   Muscle contraction headache 07/03/2014   Idiopathic insomnia 07/03/2014   Tachycardia 07/03/2014    Allergies  Allergen Reactions   Morphine And Related Nausea And Vomiting   Eggs Or Egg-Derived Products    Tape Hives and Rash    No [paper tape.  Tega derm OK    Past Surgical History:  Procedure  Laterality Date   ABDOMINAL HYSTERECTOMY     ovaries remain   ANTERIOR FUSION CERVICAL SPINE  2006   BREAST REDUCTION SURGERY     CHOLECYSTECTOMY  05/05/2019   COLONOSCOPY WITH PROPOFOL N/A 08/24/2020   Procedure: COLONOSCOPY WITH PROPOFOL;  Surgeon: Toledo, Benay Pike, MD;  Location: [facility] ENDOSCOPY;  Service: Gastroenterology;   Laterality: N/A;  DM   ESOPHAGOGASTRODUODENOSCOPY  2011   gastric ulcer   ESOPHAGOGASTRODUODENOSCOPY (EGD) WITH PROPOFOL N/A 08/24/2020   Procedure: ESOPHAGOGASTRODUODENOSCOPY (EGD) WITH PROPOFOL;  Surgeon: Toledo, Benay Pike, MD;  Location: [facility] ENDOSCOPY;  Service: Gastroenterology;  Laterality: N/A;   LITHOTRIPSY  2012   with setnet   LUMBAR [place]     NASAL SINUS SURGERY  2015   partail knee replacement Bilateral    REDUCTION MAMMAPLASTY Bilateral 2011   REPAIR TENDONS FOOT Left    THYROIDECTOMY Bilateral 12/16/2017   Procedure: THYROIDECTOMY;  Surgeon: Margaretha Sheffield, MD;  Location: [facility] ORS;  Service: ENT;  Laterality: Bilateral;   TONSILLECTOMY     TUBAL LIGATION      Social History   Tobacco Use   Smoking status: Never   Smokeless tobacco: Never  Vaping Use   Vaping Use: Never used  Substance Use Topics   Alcohol use: Yes    Alcohol/week: 0.0 - 1.0 standard drinks    Comment: on occassion   Drug use: Not Currently    Types: Marijuana     Medication list has been reviewed and updated.  Current Meds  Medication Sig   albuterol (VENTOLIN HFA) 108 (90 Base) MCG/ACT inhaler Inhale 2 puffs into the lungs every 6 (six) hours as needed for wheezing or shortness of breath.   alendronate (FOSAMAX) 35 MG tablet Take 35 mg by mouth once a week.   atorvastatin (LIPITOR) 10 MG tablet TAKE (1) TABLET BY MOUTH EVERY DAY   calcium citrate-vitamin D (CITRACAL+D) 315-200 MG-UNIT tablet Take 2 tablets by mouth 2 (two) times daily.   Fexofenadine HCl (MUCINEX ALLERGY PO) Take by mouth as needed.   fluticasone (FLONASE) 50 MCG/ACT nasal spray Place 2 sprays into both nostrils daily.   hydrochlorothiazide (HYDRODIURIL) 25 MG tablet Take 1 tablet (25 mg total) by mouth daily.   ibuprofen (ADVIL,MOTRIN) 600 MG tablet Take 1 tablet (600 mg total) by mouth every 6 (six) hours as needed.   levothyroxine (SYNTHROID) 150 MCG tablet Take 150 mcg by mouth daily.   metFORMIN (GLUCOPHAGE-XR)  500 MG 24 hr tablet Take 500 mg by mouth 2 (two) times daily. 2 in AM 2 in PM   methotrexate 2.5 MG tablet Take by mouth.   metoprolol succinate (TOPROL-XL) 50 MG 24 hr tablet TAKE (1) TABLET BY MOUTH EVERY DAY   pantoprazole (PROTONIX) 40 MG tablet TAKE (1) TABLET BY MOUTH TWICE DAILY   predniSONE (DELTASONE) 20 MG tablet Take 25 mg by mouth daily.   pregabalin (LYRICA) 75 MG capsule Take by mouth daily. 6 tablets daily   sulfamethoxazole-trimethoprim (BACTRIM) 400-80 MG tablet Take 1 tablet by mouth 2 (two) times daily.   tiZANidine (ZANAFLEX) 4 MG tablet TAKE (1) TABLET BY MOUTH DAILY AS NEEDEDFOR MUSCLE SPASMS.   valACYclovir (VALTREX) 1000 MG tablet Take 1,000 mg by mouth daily.   zaleplon (SONATA) 10 MG capsule TAKE (1) CAPSULE BY MOUTH EVERY NIGHT AT BEDTIME AS NEEDED FOR SLEEP   [DISCONTINUED] escitalopram (LEXAPRO) 20 MG tablet TAKE (1) TABLET BY MOUTH EVERY DAY   [DISCONTINUED] gabapentin (NEURONTIN) 100 MG capsule Take 900 mg by mouth  3 (three) times daily.   [DISCONTINUED] pregabalin (LYRICA) 150 MG capsule Take 150 mg by mouth daily. 6 tablets daily    PHQ 2/9 Scores 03/22/2021 12/06/2020 10/21/2020 06/29/2020  PHQ - 2 Score 2 0 0 1  PHQ- 9 Score 13 0 3 1    GAD 7 : Generalized Anxiety Score 03/22/2021 12/06/2020 10/21/2020 06/29/2020  Nervous, Anxious, on Edge 1 0 0 0  Control/stop worrying 1 0 0 0  Worry too much - different things 0 0 0 0  Trouble relaxing 0 0 0 0  Restless 0 0 0 0  Easily annoyed or irritable 2 0 0 0  Afraid - awful might happen 0 0 0 0  Total GAD 7 Score 4 0 0 0  Anxiety Difficulty - Not difficult at all Not difficult at all -    BP Readings from Last 3 Encounters:  03/22/21 122/78  12/06/20 128/64  10/21/20 122/82    Physical Exam Vitals and nursing note reviewed.  Constitutional:      General: She is not in acute distress.    Appearance: She is well-developed.  HENT:     Head: Normocephalic and atraumatic.  Cardiovascular:     Rate and Rhythm:  Normal rate and regular rhythm.  Pulmonary:     Effort: Pulmonary effort is normal. No respiratory distress.     Breath sounds: No wheezing or rhonchi.  Musculoskeletal:     Cervical back: Normal range of motion.     Right lower leg: Edema present.     Left lower leg: Edema present.  Skin:    General: Skin is warm and dry.     Capillary Refill: Capillary refill takes less than 2 seconds.     Findings: No rash.  Neurological:     Mental Status: She is alert and oriented to person, place, and time.     Gait: Gait abnormal (uses cane).  Psychiatric:        Mood and Affect: Mood normal.        Behavior: Behavior normal.    Wt Readings from Last 3 Encounters:  03/22/21 245 lb (111.1 kg)  12/06/20 244 lb (110.7 kg)  10/21/20 241 lb 9.6 oz (109.6 kg)    BP 122/78    Pulse 94    Ht 5\' 6"  (1.676 m)    Wt 245 lb (111.1 kg)    SpO2 96%    BMI 39.54 kg/m   Assessment and Plan: 1. Mood disorder ([place]) Worsened by recent health issues that are only slowly improving Will increase Lexapro to 30 mg daily Follow up if needed. - escitalopram (LEXAPRO) 10 MG tablet; Take 3 tablets (30 mg total) by mouth daily.  Dispense: 270 tablet; Refill: 0  2. Elevated BP without diagnosis of hypertension Likely due to fluid retention from steroid therapy Will start HCTZ daily PRN - hydrochlorothiazide (HYDRODIURIL) 25 MG tablet; Take 1 tablet (25 mg total) by mouth daily.  Dispense: 90 tablet; Refill: 0  3. 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 Type II diabetes mellitus with complication (HCC) Slightly worse but still acceptable Continue follow up with [facility] Continue metformin; agree with adding Jardiance  5. Hyperlipidemia, mild Continue atorvastatin  6. Post-surgical hypothyroidism Supplemented Recent TSH 7.8 with  T4 1.59   Partially dictated using Editor, commissioning.  Any errors are unintentional.  Halina Maidens, MD [place]  [facility]  03/22/2021

## 2021-03-23 ENCOUNTER — Other Ambulatory Visit: Payer: Self-pay | Admitting: Internal Medicine

## 2021-03-23 ENCOUNTER — Telehealth: Payer: Self-pay

## 2021-03-23 ENCOUNTER — Other Ambulatory Visit: Payer: Self-pay

## 2021-03-23 DIAGNOSIS — F39 Unspecified mood [affective] disorder: Secondary | ICD-10-CM

## 2021-03-23 DIAGNOSIS — R000 Tachycardia, unspecified: Secondary | ICD-10-CM

## 2021-03-23 MED ORDER — ESCITALOPRAM OXALATE 20 MG PO TABS: 30.0000 mg | ORAL_TABLET | Freq: Every day | ORAL | 1 refills | Status: DC

## 2021-03-23 NOTE — Telephone Encounter (Signed)
PA completed waiting for insurance approval. ? ? Key: [id number] ? ?KP ?

## 2021-03-23 NOTE — Telephone Encounter (Signed)
Requested Prescriptions  ?Pending Prescriptions Disp Refills  ?? metoprolol succinate (TOPROL-XL) 50 MG 24 hr tablet [Pharmacy Med Name: METOPROLOL SUCCINATE ER 50 MG TAB] 90 tablet 0  ?  Sig: TAKE (1) TABLET BY MOUTH EVERY DAY  ?  ? Cardiovascular:  Beta Blockers Passed - 03/23/2021  8:47 AM  ?  ?  Passed - Last BP in normal range  ?  BP Readings from Last 1 Encounters:  ?03/22/21 122/78  ?   ?  ?  Passed - Last Heart Rate in normal range  ?  Pulse Readings from Last 1 Encounters:  ?03/22/21 94  ?   ?  ?  Passed - Valid encounter within last 6 months  ?  Recent Outpatient Visits   ?      ? Yesterday Mood disorder ([place])  ? [facility], [facility]  ? 3 months ago Acute non-recurrent maxillary sinusitis  ? [facility] Gle[facility], [facility]  ? 5 months ago Breast pain, left  ? [facility] Glean Hess, MD  ? 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 [facility], [facility]  ? 10 months ago H/O peptic ulcer  ? [facility] Glean Hess, [facility]  ?  ?  ? ?  ?  ?  ? ? ?

## 2021-03-30 NOTE — Telephone Encounter (Signed)
Approved ? ?03/23/21-03/22/2022 ? ?Pt informed ? ?KP ?

## 2021-03-31 ENCOUNTER — Encounter: Payer: Self-pay | Admitting: Internal Medicine

## 2021-03-31 ENCOUNTER — Other Ambulatory Visit: Payer: Self-pay | Admitting: Internal Medicine

## 2021-03-31 DIAGNOSIS — I10 Essential (primary) hypertension: Secondary | ICD-10-CM

## 2021-03-31 MED ORDER — LISINOPRIL 10 MG PO TABS: 10.0000 mg | ORAL_TABLET | Freq: Every day | ORAL | 1 refills | Status: DC

## 2021-04-17 ENCOUNTER — Other Ambulatory Visit: Payer: Self-pay | Admitting: Internal Medicine

## 2021-04-17 DIAGNOSIS — Z8711 Personal history of peptic ulcer disease: Secondary | ICD-10-CM

## 2021-04-18 NOTE — Telephone Encounter (Signed)
Requested Prescriptions  ?Pending Prescriptions Disp Refills  ?? pantoprazole (PROTONIX) 40 MG tablet [Pharmacy Med Name: PANTOPRAZOLE SODIUM 40 MG DR TAB] 180 tablet 1  ?  Sig: TAKE (1) TABLET BY MOUTH TWICE DAILY  ?  ? Gastroenterology: Proton Pump Inhibitors Passed - 04/17/2021  8:04 AM  ?  ?  Passed - Valid encounter within last 12 months  ?  Recent Outpatient Visits   ?      ? 3 weeks ago Mood disorder ([place])  ? [facility] Glean [facility], MD  ? 4 months ago Acute non-recurrent maxillary sinusitis  ? [facility] Glean Hess, MD  ? 5 months ago Breast pain, left  ? [facility]  ? 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 [facility], [facility]  ? 10 months ago H/O peptic ulcer  ? [facility] Glean [facility], [facility]  ?  ?  ? ?  ?  ?  ? ?

## 2021-04-19 ENCOUNTER — Telehealth: Payer: Self-pay

## 2021-04-19 NOTE — Telephone Encounter (Signed)
Completed PA on [email or web address] for Pantoprazole 40 mg BID. ? ?Key: [id number] ? ? ?Awaiting outcome from insurance. ?

## 2021-04-24 NOTE — Telephone Encounter (Signed)
Approved! ?Effective from 04/19/2021 through 04/18/2022. ?

## 2021-04-25 ENCOUNTER — Other Ambulatory Visit: Payer: Self-pay | Admitting: Internal Medicine

## 2021-04-25 DIAGNOSIS — M542 Cervicalgia: Secondary | ICD-10-CM

## 2021-04-26 NOTE — Telephone Encounter (Signed)
Requested medication (s) are due for refill today:   Provider to review ? ?Requested medication (s) are on the active medication list:   Yes ? ?Future visit scheduled:   No ? ? ?Last ordered: 01/18/2021 #90, 0 refills ? ?Non delegated refill  ? ?Requested Prescriptions  ?Pending Prescriptions Disp Refills  ? tiZANidine (ZANAFLEX) 4 MG tablet [Pharmacy Med Name: TIZANIDINE HCL 4 MG TAB] 90 tablet 0  ?  Sig: TAKE (1) TABLET BY MOUTH DAILY AS NEEDEDFOR MUSCLE SPASMS.  ?  ? Not Delegated - Cardiovascular:  Alpha-2 Agonists - tizanidine Failed - 04/25/2021  9:09 AM  ?  ?  Failed - This refill cannot be delegated  ?  ?  Passed - Valid encounter within last 6 months  ?  Recent Outpatient Visits   ? ?      ? 1 month ago Mood disorder ([place])  ? [facility] Glean [facility], [facility]  ? 4 months ago Acute non-recurrent maxillary sinusitis  ? [facility] Glean [facility], [facility]  ? 6 months ago Breast pain, left  ? [facility] Glean Hess, MD  ? 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 [facility] Glean Hess, MD  ? 11 months ago H/O peptic ulcer  ? [facility] Glean Hess, MD  ? ?  ?  ? ?  ?  ?  ? ?

## 2021-04-27 LAB — BASIC METABOLIC PANEL
BUN: 18 (ref 4–21)
CO2: 24 — AB (ref 13–22)
Chloride: 103 (ref 99–108)
Creatinine: 1.6 — AB (ref 0.5–1.1)
Glucose: 288
Potassium: 4.5 mEq/L (ref 3.5–5.1)
Sodium: 138 (ref 137–147)

## 2021-04-27 LAB — HEPATIC FUNCTION PANEL
ALT: 49 U/L — AB (ref 7–35)
AST: 36 — AB (ref 13–35)
Alkaline Phosphatase: 100 (ref 25–125)
Bilirubin, Total: 0.6

## 2021-04-27 LAB — CBC AND DIFFERENTIAL
HCT: 37 (ref 36–46)
Hemoglobin: 12.7 (ref 12.0–16.0)
Platelets: 221 10*3/uL (ref 150–400)
WBC: 11.5

## 2021-04-27 LAB — COMPREHENSIVE METABOLIC PANEL
Albumin: 4.1 (ref 3.5–5.0)
Calcium: 9.4 (ref 8.7–10.7)

## 2021-04-27 LAB — CBC: RBC: 3.91 (ref 3.87–5.11)

## 2021-05-29 ENCOUNTER — Other Ambulatory Visit: Payer: Self-pay | Admitting: Internal Medicine

## 2021-05-29 DIAGNOSIS — F5101 Primary insomnia: Secondary | ICD-10-CM

## 2021-05-29 LAB — HEMOGLOBIN A1C: Hemoglobin A1C: 8.6

## 2021-05-30 NOTE — Telephone Encounter (Signed)
Please review. Last office visit 03/22/2021. ? ?KP

## 2021-05-30 NOTE — Telephone Encounter (Signed)
Requested medication (s) are due for refill today -yes ? ?Requested medication (s) are on the active medication list -yes ? ?Future visit scheduled -no ? ?Last refill: 12/20/20 #30 2RF ? ?Notes to clinic: non delegated Rx ? ?Requested Prescriptions  ?Pending Prescriptions Disp Refills  ? zaleplon (SONATA) 10 MG capsule [Pharmacy Med Name: ZALEPLON 10 MG CAP] 30 capsule   ?  Sig: TAKE (1) CAPSULE BY MOUTH EVERY NIGHT AT BEDTIME AS NEEDED FOR SLEEP  ?  ? Not Delegated - Psychiatry:  Anxiolytics/Hypnotics Failed - 05/29/2021  8:36 AM  ?  ?  Failed - This refill cannot be delegated  ?  ?  Failed - Urine Drug Screen completed in last 360 days  ?  ?  Passed - Valid encounter within last 6 months  ?  Recent Outpatient Visits   ? ?      ? 2 months ago Mood disorder ([place])  ? [facility] Glean Hess, MD  ? 5 months ago Acute non-recurrent maxillary sinusitis  ? [facility], [facility]  ? 7 months ago Breast pain, left  ? [facility] Glean Hess, MD  ? 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 [facility] Gle[facility], [facility]  ? 1 year ago H/O peptic ulcer  ? [facility] Glean Hess, MD  ? ?  ?  ? ? ?  ?  ?  ? ? ? ?Requested Prescriptions  ?Pending Prescriptions Disp Refills  ? zaleplon (SONATA) 10 MG capsule [Pharmacy Med Name: ZALEPLON 10 MG CAP] 30 capsule   ?  Sig: TAKE (1) CAPSULE BY MOUTH EVERY NIGHT AT BEDTIME AS NEEDED FOR SLEEP  ?  ? Not Delegated - Psychiatry:  Anxiolytics/Hypnotics Failed - 05/29/2021  8:36 AM  ?  ?  Failed - This refill cannot be delegated  ?  ?  Failed - Urine Drug Screen completed in last 360 days  ?  ?  Passed - Valid encounter within last 6 months  ?  Recent Outpatient Visits   ? ?      ? 2 months ago Mood disorder ([place])  ? [facility] Glean Hess, MD  ? 5 months ago Acute non-recurrent maxillary sinusitis  ? [facility], [facility]  ? 7 months ago Breast pain, left  ? [facility] Glean Hess, MD  ? 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 [facility] Glean Hess, MD  ? 1 year ago H/O peptic ulcer  ? [facility] Glean Hess, MD  ? ?  ?  ? ? ?  ?  ?  ? ? ? ?

## 2021-07-07 ENCOUNTER — Other Ambulatory Visit: Payer: Self-pay | Admitting: Internal Medicine

## 2021-07-07 DIAGNOSIS — R000 Tachycardia, unspecified: Secondary | ICD-10-CM

## 2021-07-07 DIAGNOSIS — R030 Elevated blood-pressure reading, without diagnosis of hypertension: Secondary | ICD-10-CM

## 2021-07-07 NOTE — Telephone Encounter (Signed)
Requested Prescriptions  Pending Prescriptions Disp Refills  . hydrochlorothiazide (HYDRODIURIL) 25 MG tablet [Pharmacy Med Name: HYDROCHLOROTHIAZIDE 25 MG TAB] 90 tablet 0    Sig: TAKE (1) TABLET BY MOUTH EVERY DAY     Cardiovascular: Diuretics - Thiazide Failed - 07/07/2021  8:29 AM      Failed - Cr in normal range and within 180 days    Creatinine  Date Value Ref Range Status  04/27/2021 1.6 (A) 0.5 - 1.1 Final   Creatinine, Ser  Date Value Ref Range Status  03/18/2019 0.72 0.57 - 1.00 mg/dL Final         Passed - K in normal range and within 180 days    Potassium  Date Value Ref Range Status  04/27/2021 4.5 3.5 - 5.1 mEq/L Final         Passed - Na in normal range and within 180 days    Sodium  Date Value Ref Range Status  04/27/2021 138 137 - 147 Final         Passed - Last BP in normal range    BP Readings from Last 1 Encounters:  03/22/21 122/78         Passed - Valid encounter within last 6 months    Recent Outpatient Visits          3 months ago Mood disorder [facility])   [facility], [facility]   7 months ago Acute non-recurrent maxillary sinusitis   [facility], [facility]   8 months ago Breast pain, left   [facility], [facility]   1 year ago 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 metoprolol succinate (TOPROL-XL) 50 MG 24 hr tablet [Pharmacy Med Name: METOPROLOL SUCCINATE ER 50 MG TAB] 90 tablet 0    Sig: TAKE (1) TABLET BY MOUTH EVERY DAY     Cardiovascular:  Beta Blockers Passed - 07/07/2021  8:29 AM      Passed - Last BP in normal range    BP Readings from Last 1 Encounters:  03/22/21 122/78         Passed - Last Heart Rate in normal range    Pulse Readings from Last 1 Encounters:  03/22/21 94         Passed - Valid encounter within last 6 months    Recent  Outpatient Visits          3 months ago Mood disorder [facility])   [facility], [facility]   7 months ago Acute non-recurrent maxillary sinusitis   [facility], [facility]   8 months ago Breast pain, left   [facility] Glean [facility], [facility]   1 year ago 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

## 2021-07-11 LAB — BASIC METABOLIC PANEL
BUN: 9 (ref 4–21)
CO2: 27 — AB (ref 13–22)
Chloride: 105 (ref 99–108)
Creatinine: 0.7 (ref 0.5–1.1)
Potassium: 4.5 mEq/L (ref 3.5–5.1)
Sodium: 140 (ref 137–147)

## 2021-07-11 LAB — COMPREHENSIVE METABOLIC PANEL: eGFR: 90

## 2021-07-19 ENCOUNTER — Encounter: Payer: Self-pay | Admitting: Internal Medicine

## 2021-07-19 DIAGNOSIS — D8689 Sarcoidosis of other sites: Secondary | ICD-10-CM | POA: Insufficient documentation

## 2021-07-19 DIAGNOSIS — G549 Nerve root and plexus disorder, unspecified: Secondary | ICD-10-CM | POA: Insufficient documentation

## 2021-08-14 ENCOUNTER — Other Ambulatory Visit: Payer: Self-pay | Admitting: Internal Medicine

## 2021-08-14 DIAGNOSIS — F5101 Primary insomnia: Secondary | ICD-10-CM

## 2021-08-15 NOTE — Telephone Encounter (Signed)
Please review. Last office visit 03/22/21.  KP

## 2021-08-15 NOTE — Telephone Encounter (Signed)
Requested medication (s) are due for refill today - no  Requested medication (s) are on the active medication list -yes  Future visit scheduled -no  Last refill: 05/30/21 #30 2RF  Notes to clinic: non delegated Rx  Requested Prescriptions  Pending Prescriptions Disp Refills   zaleplon (SONATA) 10 MG capsule [Pharmacy Med Name: ZALEPLON 10 MG CAP] 30 capsule     Sig: TAKE (1) CAPSULE BY MOUTH EVERY NIGHT AT BEDTIME AS NEEDED FOR SLEEP     Not Delegated - Psychiatry:  Anxiolytics/Hypnotics Failed - 08/14/2021 11:03 AM      Failed - This refill cannot be delegated      Failed - Urine Drug Screen completed in last 360 days      Passed - Valid encounter within last 6 months    Recent Outpatient Visits           4 months ago Mood disorder [facility])   [facility], [facility]   8 months ago Acute non-recurrent maxillary sinusitis   [facility], [facility]   9 months ago Breast pain, left   [facility], [facility]   1 year ago 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   [facility] Glean Hess, MD   1 year ago H/O peptic ulcer   [facility] Glean Hess, [facility]                 Requested Prescriptions  Pending Prescriptions Disp Refills   zaleplon (SONATA) 10 MG capsule [Pharmacy Med Name: ZALEPLON 10 MG CAP] 30 capsule     Sig: TAKE (1) CAPSULE BY MOUTH EVERY NIGHT AT BEDTIME AS NEEDED FOR SLEEP     Not Delegated - Psychiatry:  Anxiolytics/Hypnotics Failed - 08/14/2021 11:03 AM      Failed - This refill cannot be delegated      Failed - Urine Drug Screen completed in last 360 days      Passed - Valid encounter within last 6 months    Recent Outpatient Visits           4 months ago Mood disorder [facility])   [facility] Gle[facility], [facility]   8 months ago Acute non-recurrent maxillary sinusitis   [facility] Gle[facility], [facility]   9 months ago Breast pain, left   [facility] Glean Hess, [facility]   1 year ago 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

## 2021-08-16 ENCOUNTER — Other Ambulatory Visit: Payer: Self-pay | Admitting: Internal Medicine

## 2021-08-16 DIAGNOSIS — E785 Hyperlipidemia, unspecified: Secondary | ICD-10-CM

## 2021-08-17 NOTE — Telephone Encounter (Signed)
Requested Prescriptions  Pending Prescriptions Disp Refills  . atorvastatin (LIPITOR) 10 MG tablet [Pharmacy Med Name: ATORVASTATIN CALCIUM 10 MG TAB] 90 tablet 0    Sig: TAKE (1) TABLET BY MOUTH EVERY DAY     Cardiovascular:  Antilipid - Statins Failed - 08/16/2021  1:07 PM      Failed - Lipid Panel in normal range within the last 12 months    Cholesterol, Total  Date Value Ref Range Status  07/09/2019 152 100 - 199 mg/dL Final   LDL Chol Calc (NIH)  Date Value Ref Range Status  07/09/2019 81 0 - 99 mg/dL Final   HDL  Date Value Ref Range Status  07/09/2019 41 >39 mg/dL Final   Triglycerides  Date Value Ref Range Status  07/09/2019 174 (H) 0 - 149 mg/dL Final         Passed - Patient is not pregnant      Passed - Valid encounter within last 12 months    Recent Outpatient Visits          4 months ago Mood disorder [facility])   [facility] Glean [facility], [facility]   8 months ago Acute non-recurrent maxillary sinusitis   [facility], [facility]   10 months ago Breast pain, left   [facility] Glean Hess, MD   1 year ago 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

## 2021-08-30 ENCOUNTER — Other Ambulatory Visit: Payer: Self-pay | Admitting: Internal Medicine

## 2021-08-30 DIAGNOSIS — F5101 Primary insomnia: Secondary | ICD-10-CM

## 2021-08-30 NOTE — Telephone Encounter (Signed)
Please review. Last office visit 03/22/21.  KP

## 2021-08-30 NOTE — Telephone Encounter (Signed)
Requested medications are due for refill today.  yes  Requested medications are on the active medications list.  yes  Last refill. 05/30/2021 #30 2 refills  Future visit scheduled.   no  Notes to clinic.  No pcp listed. Refill not delegated.    Requested Prescriptions  Pending Prescriptions Disp Refills   zaleplon (SONATA) 10 MG capsule [Pharmacy Med Name: ZALEPLON 10 MG CAP] 30 capsule     Sig: TAKE (1) CAPSULE BY MOUTH EVERY NIGHT AT BEDTIME AS NEEDED FOR SLEEP     Not Delegated - Psychiatry:  Anxiolytics/Hypnotics Failed - 08/30/2021  9:38 AM      Failed - This refill cannot be delegated      Failed - Urine Drug Screen completed in last 360 days      Passed - Valid encounter within last 6 months    Recent Outpatient Visits           5 months ago Mood disorder [facility])   [facility] Glean [facility], [facility]   8 months ago Acute non-recurrent maxillary sinusitis   [facility] Gle[facility] Hess, [facility]   10 months ago Breast pain, left   [facility] Glean Hess, [facility]   1 year ago 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

## 2021-08-31 ENCOUNTER — Encounter: Payer: Self-pay | Admitting: Internal Medicine

## 2021-08-31 ENCOUNTER — Other Ambulatory Visit: Payer: Self-pay | Admitting: Internal Medicine

## 2021-08-31 DIAGNOSIS — F5101 Primary insomnia: Secondary | ICD-10-CM

## 2021-08-31 MED ORDER — ZALEPLON 10 MG PO CAPS
ORAL_CAPSULE | ORAL | 2 refills | Status: DC
Start: 2021-08-31 — End: 2022-02-16

## 2021-08-31 NOTE — Telephone Encounter (Signed)
Please review.  KP

## 2021-09-12 ENCOUNTER — Other Ambulatory Visit: Payer: Self-pay | Admitting: Internal Medicine

## 2021-09-12 DIAGNOSIS — R000 Tachycardia, unspecified: Secondary | ICD-10-CM

## 2021-09-13 NOTE — Telephone Encounter (Signed)
Requested Prescriptions  Pending Prescriptions Disp Refills  . metoprolol succinate (TOPROL-XL) 50 MG 24 hr tablet [Pharmacy Med Name: METOPROLOL SUCCINATE ER 50 MG TAB] 90 tablet 0    Sig: TAKE (1) TABLET BY MOUTH EVERY DAY     Cardiovascular:  Beta Blockers Passed - 09/12/2021  4:22 PM      Passed - Last BP in normal range    BP Readings from Last 1 Encounters:  03/22/21 122/78         Passed - Last Heart Rate in normal range    Pulse Readings from Last 1 Encounters:  03/22/21 94         Passed - Valid encounter within last 6 months    Recent Outpatient Visits          5 months ago Mood disorder [facility])   [place] [facility] at [facility], Jesse Sans, MD   9 months ago Acute non-recurrent maxillary sinusitis   [place] [facility] at [facility], Jesse Sans, MD   10 months ago Breast pain, left   [place] [facility] at [facility], Jesse Sans, MD   1 year ago 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

## 2021-09-26 LAB — TSH: TSH: 0.02 — AB (ref <=5.90)

## 2021-09-26 LAB — HEMOGLOBIN A1C: Hemoglobin A1C: 4.9

## 2021-09-26 IMAGING — MG DIGITAL SCREENING BILAT W/ TOMO W/ CAD
8 series · 8 of 24 positions shown · non-contrast
Comparison: Previous exam(s).

CLINICAL DATA: Screening.

EXAM:
DIGITAL SCREENING BILATERAL MAMMOGRAM WITH TOMO AND CAD

[R CC synth-2D]
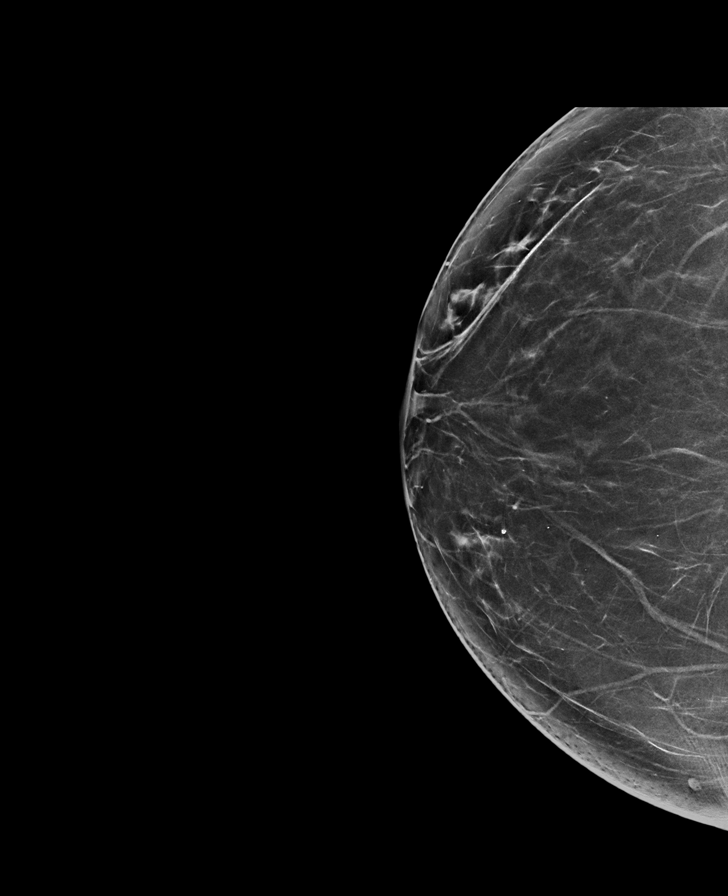

[R MLO synth-2D]
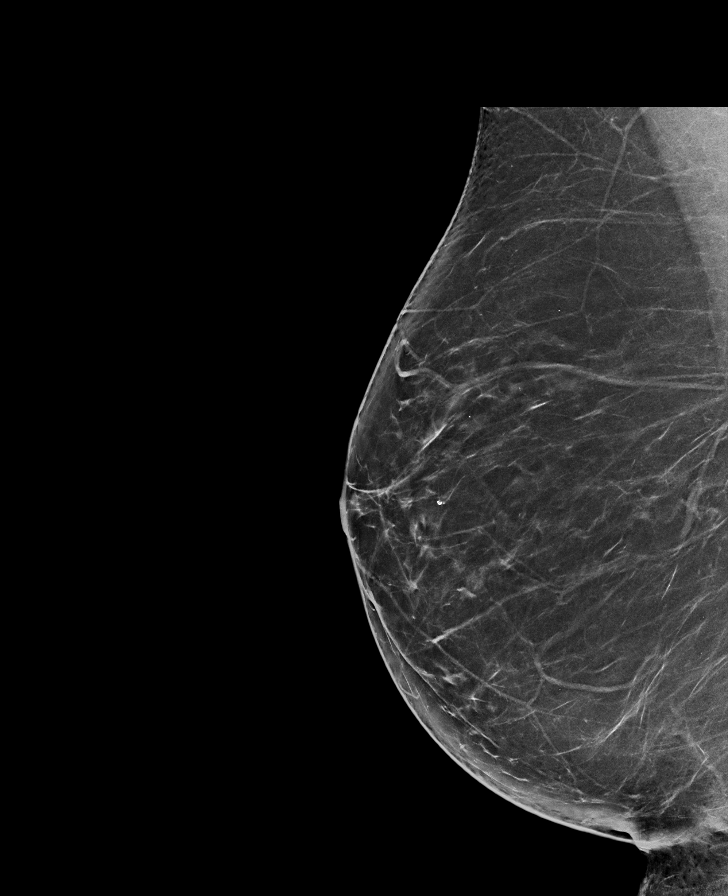

[L CC synth-2D]
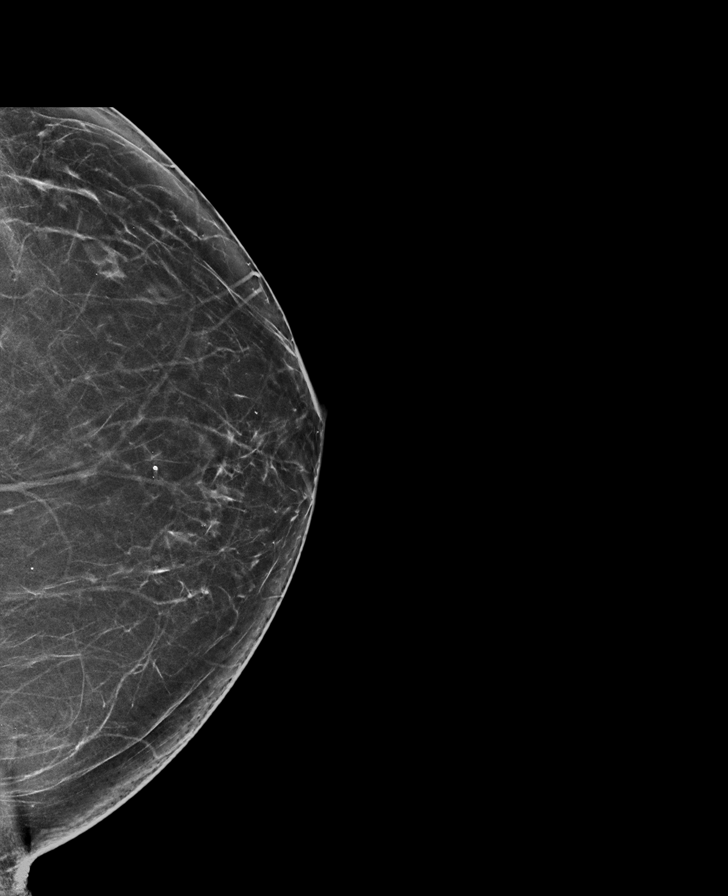

[L MLO synth-2D]
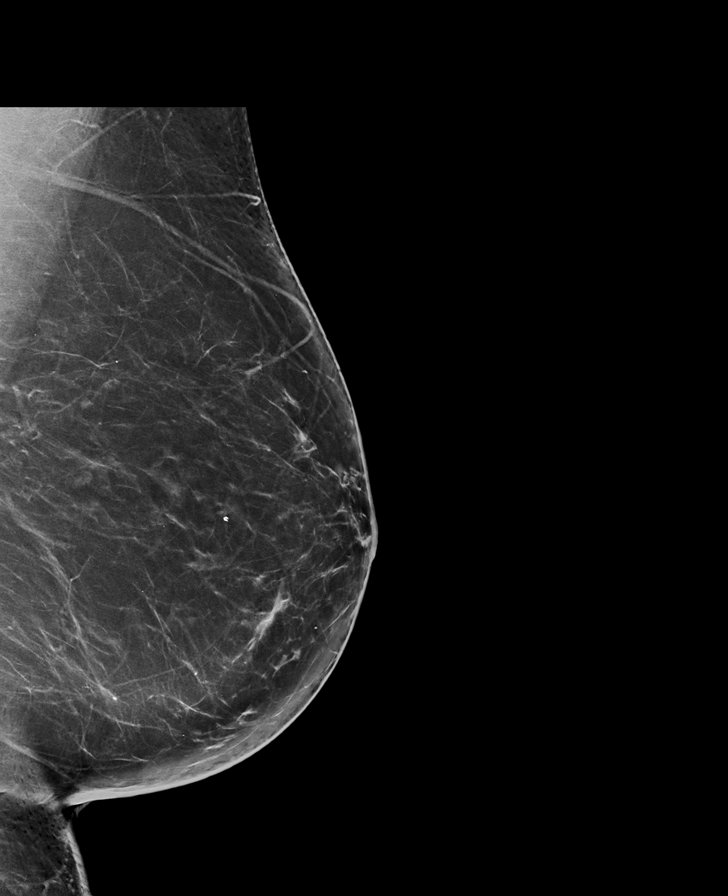

[L CC tomo · tomo slice 37/72.0]
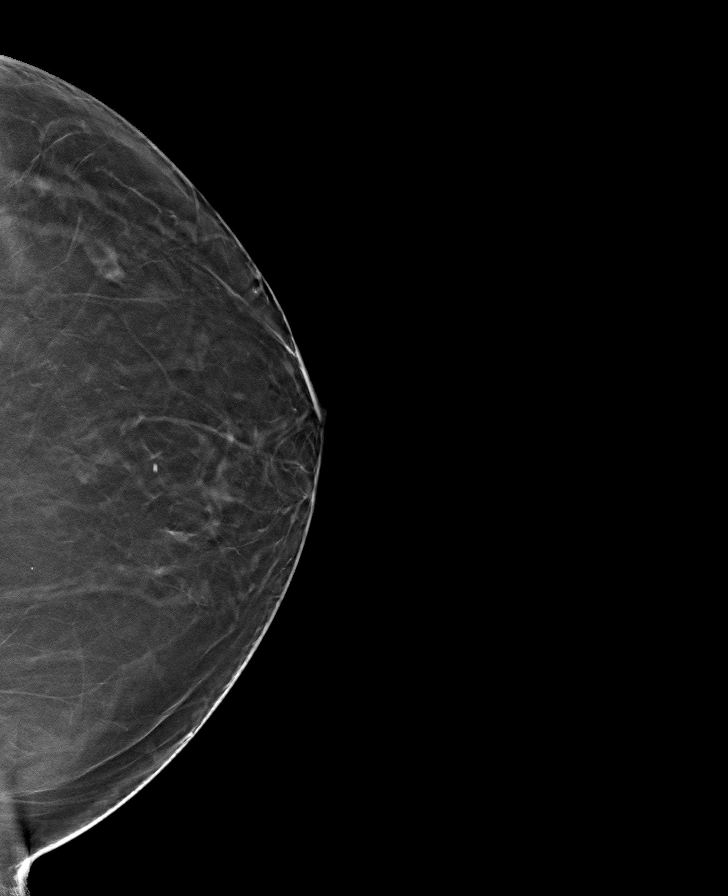

[R CC tomo · tomo slice 37/74.0]
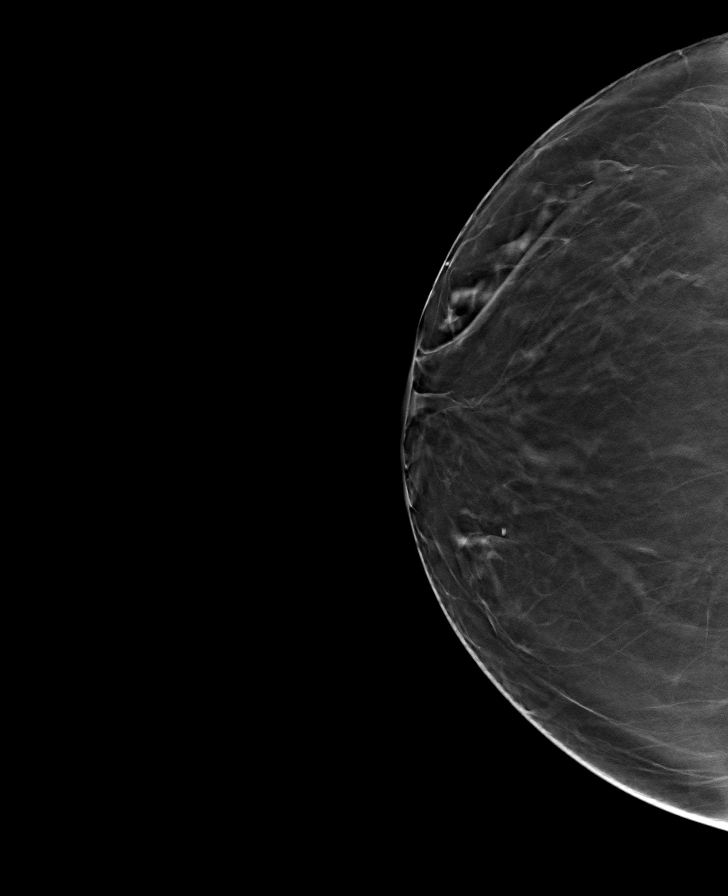

[L MLO tomo · tomo slice 41/82.0]
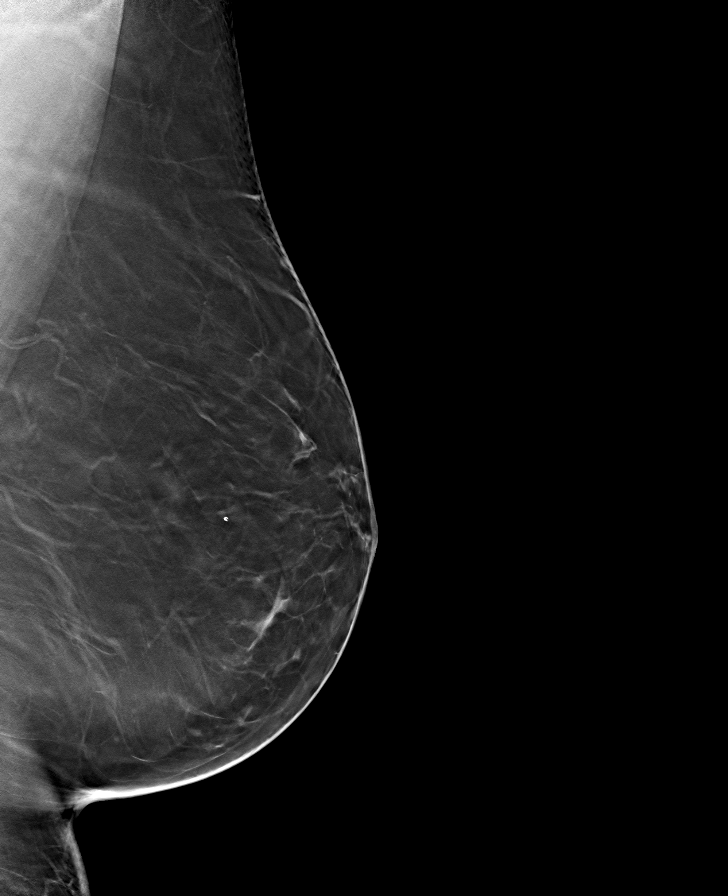

[R MLO tomo · tomo slice 39/78.0]
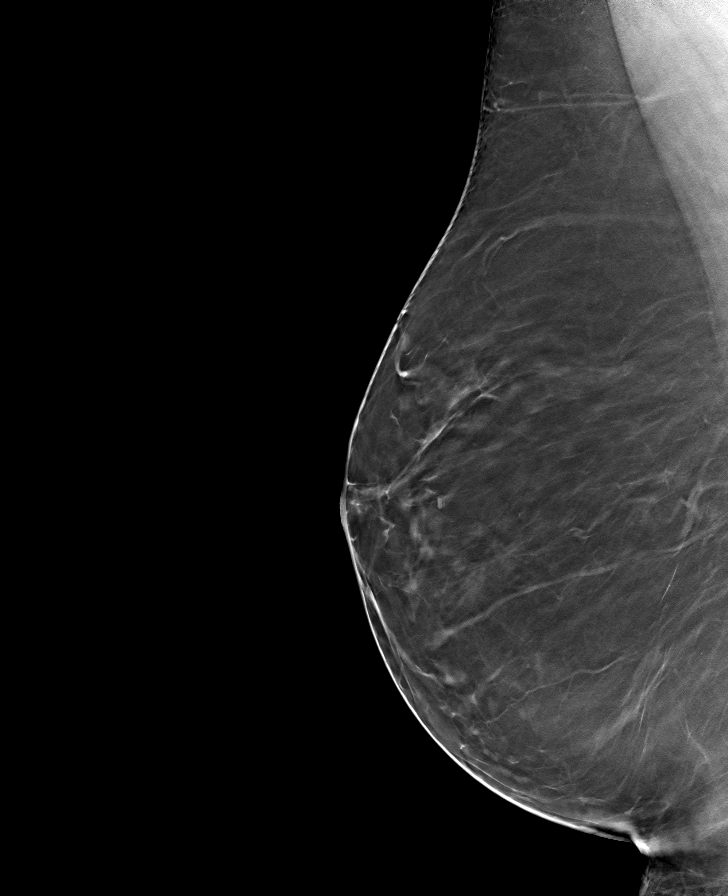

[8 of 24 positions shown; findings below may reference images not displayed]

ACR Breast Density Category b: There are scattered areas of
fibroglandular density.
FINDINGS: There are no findings suspicious for malignancy. <CAD Text>
IMPRESSION: No mammographic evidence of malignancy. A result letter of this
screening mammogram will be mailed directly to the patient.

RECOMMENDATION:
<Mammography Recommendation>

BI-RADS CATEGORY  1: Negative.

## 2021-10-11 ENCOUNTER — Other Ambulatory Visit: Payer: Self-pay | Admitting: Internal Medicine

## 2021-10-11 DIAGNOSIS — I10 Essential (primary) hypertension: Secondary | ICD-10-CM

## 2021-10-11 DIAGNOSIS — R030 Elevated blood-pressure reading, without diagnosis of hypertension: Secondary | ICD-10-CM

## 2021-10-12 NOTE — Telephone Encounter (Signed)
Courtesy refill. Requested Prescriptions  Pending Prescriptions Disp Refills  . hydrochlorothiazide (HYDRODIURIL) 25 MG tablet [Pharmacy Med Name: HYDROCHLOROTHIAZIDE 25 MG TAB] 30 tablet 0    Sig: TAKE (1) TABLET BY MOUTH EVERY DAY     Cardiovascular: Diuretics - Thiazide Failed - 10/11/2021 12:01 PM      Failed - Cr in normal range and within 180 days    Creatinine  Date Value Ref Range Status  04/27/2021 1.6 (A) 0.5 - 1.1 Final   Creatinine, Ser  Date Value Ref Range Status  03/18/2019 0.72 0.57 - 1.00 mg/dL Final   Creatinine, Urine  Date Value Ref Range Status  02/27/2021 123  Final         Failed - Valid encounter within last 6 months    Recent Outpatient Visits          6 months ago Mood disorder [facility])   [place] [facility] at [facility], Jesse Sans, MD   10 months ago Acute non-recurrent maxillary sinusitis   [place] [facility] at [facility], Jesse Sans, MD   11 months ago Breast pain, left   [place] [facility] at [facility], Jesse Sans, MD   1 year ago 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   [place] and Sports Medicine at [facility], Jesse Sans, MD   1 year ago H/O peptic ulcer   [place] [facility] Medicine at [facility], Jesse Sans, MD             Passed - K in normal range and within 180 days    Potassium  Date Value Ref Range Status  04/27/2021 4.5 3.5 - 5.1 mEq/L Final         Passed - Na in normal range and within 180 days    Sodium  Date Value Ref Range Status  04/27/2021 138 137 - 147 Final         Passed - Last BP in normal range    BP Readings from Last 1 Encounters:  03/22/21 122/78         . lisinopril (ZESTRIL) 10 MG tablet [Pharmacy Med Name: LISINOPRIL 10 MG TAB] 30 tablet 0    Sig: TAKE (1) TABLET BY MOUTH EVERY DAY     Cardiovascular:  ACE Inhibitors Failed - 10/11/2021 12:01  PM      Failed - Cr in normal range and within 180 days    Creatinine  Date Value Ref Range Status  04/27/2021 1.6 (A) 0.5 - 1.1 Final   Creatinine, Ser  Date Value Ref Range Status  03/18/2019 0.72 0.57 - 1.00 mg/dL Final   Creatinine, Urine  Date Value Ref Range Status  02/27/2021 123  Final         Failed - Valid encounter within last 6 months    Recent Outpatient Visits          6 months ago Mood disorder [facility])   [place] [facility] at [facility], Jesse Sans, MD   10 months ago Acute non-recurrent maxillary sinusitis   [place] [facility] at [facility], Jesse Sans, MD   11 months ago Breast pain, left   [place] [facility] at [facility], Jesse Sans, MD   1 year ago 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   [place] and  Sports Medicine at [employer], Jesse Sans, MD   1 year ago H/O peptic ulcer   [place] [facility] Medicine at [facility], Jesse Sans, MD             Passed - K in normal range and within 180 days    Potassium  Date Value Ref Range Status  04/27/2021 4.5 3.5 - 5.1 mEq/L Final         Passed - Patient is not pregnant      Passed - Last BP in normal range    BP Readings from Last 1 Encounters:  03/22/21 122/78

## 2021-11-06 ENCOUNTER — Telehealth: Payer: Self-pay | Admitting: Internal Medicine

## 2021-11-06 DIAGNOSIS — R030 Elevated blood-pressure reading, without diagnosis of hypertension: Secondary | ICD-10-CM

## 2021-11-06 DIAGNOSIS — M542 Cervicalgia: Secondary | ICD-10-CM

## 2021-11-06 DIAGNOSIS — R000 Tachycardia, unspecified: Secondary | ICD-10-CM

## 2021-11-06 DIAGNOSIS — E785 Hyperlipidemia, unspecified: Secondary | ICD-10-CM

## 2021-11-07 NOTE — Telephone Encounter (Signed)
Requested medication (s) are due for refill today:   Provider to review  Requested medication (s) are on the active medication list:   Yes for all 4  Future visit scheduled:   No   There is a question if pt still seeing Dr. Army Melia or has a new PCP.   Last ordered: HCTZ 10/12/2021 #30, 0 refills,  metoprolol 09/13/2021 #90, 0,   Zanaflex 04/26/2021 #90, 0 refills,     atorvastatin 08/17/2021 #90, 0 refills   Returned due to non delegated and question regarding PCP.    Requested Prescriptions  Pending Prescriptions Disp Refills   hydrochlorothiazide (HYDRODIURIL) 25 MG tablet [Pharmacy Med Name: HYDROCHLOROTHIAZIDE 25 MG TAB] 30 tablet 0    Sig: TAKE (1) TABLET BY MOUTH EVERY DAY     Cardiovascular: Diuretics - Thiazide Failed - 11/06/2021 11:30 AM      Failed - Cr in normal range and within 180 days    Creatinine  Date Value Ref Range Status  04/27/2021 1.6 (A) 0.5 - 1.1 Final   Creatinine, Ser  Date Value Ref Range Status  03/18/2019 0.72 0.57 - 1.00 mg/dL Final   Creatinine, Urine  Date Value Ref Range Status  02/27/2021 123  Final         Failed - K in normal range and within 180 days    Potassium  Date Value Ref Range Status  04/27/2021 4.5 3.5 - 5.1 mEq/L Final         Failed - Na in normal range and within 180 days    Sodium  Date Value Ref Range Status  04/27/2021 138 137 - 147 Final         Failed - Valid encounter within last 6 months    Recent Outpatient Visits           7 months ago Mood disorder [facility])   [place] [facility] at [facility], Jesse Sans, MD   11 months ago Acute non-recurrent maxillary sinusitis   [place] [facility] at [facility], Jesse Sans, MD   1 year ago Breast pain, left   [place] [facility] at [facility], Jesse Sans, MD   1 year ago 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   [place] and Sports Medicine at [facility], Jesse Sans, MD   1 year ago H/O peptic ulcer   [place] [facility] Medicine at [facility], Jesse Sans, MD              Passed - Last BP in normal range    BP Readings from Last 1 Encounters:  03/22/21 122/78          metoprolol succinate (TOPROL-XL) 50 MG 24 hr tablet [Pharmacy Med Name: METOPROLOL SUCCINATE ER 50 MG TAB] 90 tablet 0    Sig: TAKE (1) TABLET BY MOUTH EVERY DAY     Cardiovascular:  Beta Blockers Failed - 11/06/2021 11:30 AM      Failed - Valid encounter within last 6 months    Recent Outpatient Visits           7 months ago Mood disorder [facility])   [place] [facility] at [facility], Jesse Sans, MD   11 months ago Acute non-recurrent maxillary sinusitis   [place] [facility] Medicine at [facility], Jesse Sans, MD   1 year ago  Breast pain, left   [place] [facility] at [facility], Jesse Sans, MD   1 year ago 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   [facility] and [facility] Medicine at [facility], Jesse Sans, MD   1 year ago H/O peptic ulcer   [place] [facility] Medicine at [facility], Jesse Sans, MD              Passed - Last BP in normal range    BP Readings from Last 1 Encounters:  03/22/21 122/78         Passed - Last Heart Rate in normal range    Pulse Readings from Last 1 Encounters:  03/22/21 94          tiZANidine (ZANAFLEX) 4 MG tablet [Pharmacy Med Name: TIZANIDINE HCL 4 MG TAB] 90 tablet 0    Sig: TAKE (1) TABLET BY MOUTH DAILY AS NEEDEDFOR MUSCLE SPASMS.     Not Delegated - Cardiovascular:  Alpha-2 Agonists - tizanidine Failed - 11/06/2021 11:30 AM      Failed - This refill cannot be delegated      Failed - Valid encounter within last 6 months    Recent Outpatient Visits           7 months ago Mood disorder [facility])   [place] [facility]  at [facility], Jesse Sans, MD   11 months ago Acute non-recurrent maxillary sinusitis   [place] [facility] at [facility], Jesse Sans, MD   1 year ago Breast pain, left   [place] [facility] Medicine at [facility], Jesse Sans, MD   1 year ago 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   [place] and Sports Medicine at [facility], Jesse Sans, MD   1 year ago H/O peptic ulcer   [place] [facility] at [facility], Jesse Sans, MD               atorvastatin (LIPITOR) 10 MG tablet [Pharmacy Med Name: ATORVASTATIN CALCIUM 10 MG TAB] 90 tablet 0    Sig: TAKE (1) TABLET BY MOUTH EVERY DAY     Cardiovascular:  Antilipid - Statins Failed - 11/06/2021 11:30 AM      Failed - Lipid Panel in normal range within the last 12 months    Cholesterol, Total  Date Value Ref Range Status  07/09/2019 152 100 - 199 mg/dL Final   LDL Chol Calc (NIH)  Date Value Ref Range Status  07/09/2019 81 0 - 99 mg/dL Final   HDL  Date Value Ref Range Status  07/09/2019 41 >39 mg/dL Final   Triglycerides  Date Value Ref Range Status  07/09/2019 174 (H) 0 - 149 mg/dL Final         Passed - Patient is not pregnant      Passed - Valid encounter within last 12 months    Recent Outpatient Visits           7 months ago Mood disorder [facility])   [place] [facility] at [facility], Jesse Sans, MD   11 months ago Acute non-recurrent maxillary sinusitis   [place] [facility] at [facility], Jesse Sans, MD   1 year ago Breast pain, left   [facility] at [facility],  Jesse Sans, MD   1 year ago 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   [facility] Medicine at [facility], Jesse Sans, MD   1 year ago H/O peptic ulcer   [place] [facility]  Medicine at [facility], Jesse Sans, MD

## 2021-11-07 NOTE — Telephone Encounter (Signed)
Left voice mail to set up medication refill appointment °

## 2021-11-08 ENCOUNTER — Encounter: Payer: Self-pay | Admitting: Internal Medicine

## 2021-11-08 DIAGNOSIS — G549 Nerve root and plexus disorder, unspecified: Secondary | ICD-10-CM

## 2021-11-09 ENCOUNTER — Other Ambulatory Visit: Payer: Self-pay

## 2021-11-09 ENCOUNTER — Ambulatory Visit: Payer: BLUE CROSS/BLUE SHIELD | Admitting: Internal Medicine

## 2021-11-09 DIAGNOSIS — I10 Essential (primary) hypertension: Secondary | ICD-10-CM

## 2021-11-09 DIAGNOSIS — M542 Cervicalgia: Secondary | ICD-10-CM

## 2021-11-09 DIAGNOSIS — R030 Elevated blood-pressure reading, without diagnosis of hypertension: Secondary | ICD-10-CM

## 2021-11-09 DIAGNOSIS — E785 Hyperlipidemia, unspecified: Secondary | ICD-10-CM

## 2021-11-09 MED ORDER — TIZANIDINE HCL 4 MG PO TABS: ORAL_TABLET | ORAL | 0 refills | Status: AC

## 2021-11-09 MED ORDER — ATORVASTATIN CALCIUM 10 MG PO TABS: ORAL_TABLET | ORAL | 0 refills | Status: DC

## 2021-11-09 MED ORDER — HYDROCHLOROTHIAZIDE 25 MG PO TABS: ORAL_TABLET | ORAL | 0 refills | Status: DC

## 2021-11-09 MED ORDER — LISINOPRIL 10 MG PO TABS: ORAL_TABLET | ORAL | 0 refills | Status: DC

## 2021-11-09 NOTE — Telephone Encounter (Signed)
Pt is requesting refill to get her through to her appointment. Pt was scheduled for first available 11/20/2021 with PCP.   Please advise.

## 2021-11-15 ENCOUNTER — Ambulatory Visit: Payer: BLUE CROSS/BLUE SHIELD | Admitting: Internal Medicine

## 2021-11-15 ENCOUNTER — Encounter: Payer: Self-pay | Admitting: Internal Medicine

## 2021-11-15 VITALS — BP 128/76 | HR 98 | Ht 66.0 in | Wt 217.0 lb

## 2021-11-15 DIAGNOSIS — F5101 Primary insomnia: Secondary | ICD-10-CM

## 2021-11-15 DIAGNOSIS — E785 Hyperlipidemia, unspecified: Secondary | ICD-10-CM

## 2021-11-15 DIAGNOSIS — R000 Tachycardia, unspecified: Secondary | ICD-10-CM

## 2021-11-15 DIAGNOSIS — F39 Unspecified mood [affective] disorder: Secondary | ICD-10-CM

## 2021-11-15 DIAGNOSIS — Z1231 Encounter for screening mammogram for malignant neoplasm of breast: Secondary | ICD-10-CM

## 2021-11-15 DIAGNOSIS — G549 Nerve root and plexus disorder, unspecified: Secondary | ICD-10-CM | POA: Diagnosis not present

## 2021-11-15 DIAGNOSIS — E118 Type 2 diabetes mellitus with unspecified complications: Secondary | ICD-10-CM

## 2021-11-15 DIAGNOSIS — Z8711 Personal history of peptic ulcer disease: Secondary | ICD-10-CM | POA: Diagnosis not present

## 2021-11-15 DIAGNOSIS — I10 Essential (primary) hypertension: Secondary | ICD-10-CM

## 2021-11-15 MED ORDER — PANTOPRAZOLE SODIUM 40 MG PO TBEC: DELAYED_RELEASE_TABLET | ORAL | 1 refills | Status: DC

## 2021-11-15 MED ORDER — ATORVASTATIN CALCIUM 10 MG PO TABS: ORAL_TABLET | ORAL | 1 refills | Status: DC

## 2021-11-15 MED ORDER — HYDROCHLOROTHIAZIDE 25 MG PO TABS: ORAL_TABLET | ORAL | 1 refills | Status: DC

## 2021-11-15 MED ORDER — METOPROLOL SUCCINATE ER 50 MG PO TB24: ORAL_TABLET | ORAL | 1 refills | Status: DC

## 2021-11-15 MED ORDER — ESCITALOPRAM OXALATE 20 MG PO TABS: 30.0000 mg | ORAL_TABLET | Freq: Every day | ORAL | 1 refills | Status: DC

## 2021-11-15 MED ORDER — LISINOPRIL 10 MG PO TABS: ORAL_TABLET | ORAL | 1 refills | Status: DC

## 2021-11-15 NOTE — Progress Notes (Signed)
Date:  11/15/2021   Name:  Gabrielle Tucker   DOB:  1960-11-08   MRN:  [id number]   Chief Complaint: Diabetes, Hypertension, and Mood Disorder  Hypertension This is a chronic problem. The problem is controlled. Associated symptoms include headaches. Pertinent negatives include no chest pain, palpitations or shortness of breath. Past treatments include ACE inhibitors and diuretics. The current treatment provides significant improvement. There is no history of kidney disease, CAD/MI or CVA.  Hyperlipidemia This is a chronic problem. The problem is controlled. Associated symptoms include myalgias. Pertinent negatives include no chest pain or shortness of breath. Current antihyperlipidemic treatment includes statins.  Insomnia Primary symptoms: no sleep disturbance.   Treatments tried: tried Quarry manager but not working well now. PMH includes: depression.   Depression        This is a chronic problem.The problem is unchanged.  Associated symptoms include fatigue, insomnia, myalgias and headaches.  Past treatments include SSRIs - Selective serotonin reuptake inhibitors.  Compliance with treatment is good. Diabetes She presents for her follow-up diabetic visit. She has type 2 (followed by Endo) diabetes mellitus. Hypoglycemia symptoms include headaches. Pertinent negatives for hypoglycemia include no dizziness or nervousness/anxiousness. Associated symptoms include fatigue. Pertinent negatives for diabetes include no chest pain. Pertinent negatives for diabetic complications include no CVA. Current diabetic treatments: on Mounjaro.  Gastroesophageal Reflux She reports no abdominal pain, no chest pain, no coughing, no dysphagia, no heartburn, no hoarse voice, no sore throat or no water brash. The problem occurs rarely. Associated symptoms include fatigue. She has tried a PPI for the symptoms. The treatment provided significant relief.    Lab Results  Component Value Date   NA 140 07/11/2021   K  4.5 07/11/2021   CO2 27 (A) 07/11/2021   GLUCOSE 109 (H) 03/18/2019   BUN 9 07/11/2021   CREATININE 0.7 07/11/2021   CALCIUM 9.4 04/27/2021   EGFR 90 07/11/2021   GFRNONAA 92 03/18/2019   Lab Results  Component Value Date   CHOL 152 07/09/2019   HDL 41 07/09/2019   LDLCALC 81 07/09/2019   TRIG 174 (H) 07/09/2019   CHOLHDL 3.7 07/09/2019   Lab Results  Component Value Date   TSH 0.02 (A) 09/26/2021   Lab Results  Component Value Date   HGBA1C 4.9 09/26/2021   Lab Results  Component Value Date   WBC 11.5 04/27/2021   HGB 12.7 04/27/2021   HCT 37 04/27/2021   MCV 92 01/07/2019   PLT 221 04/27/2021   Lab Results  Component Value Date   ALT 49 (A) 04/27/2021   AST 36 (A) 04/27/2021   ALKPHOS 100 04/27/2021   BILITOT 0.7 07/09/2019   No results found for: "25OHVITD2", "25OHVITD3", "VD25OH"   Review of Systems  Constitutional:  Positive for fatigue.  HENT:  Negative for hoarse voice, sore throat and trouble swallowing.   Respiratory:  Negative for cough, chest tightness and shortness of breath.   Cardiovascular:  Negative for chest pain, palpitations and leg swelling.  Gastrointestinal:  Negative for abdominal pain, diarrhea, dysphagia and heartburn.  Musculoskeletal:  Positive for arthralgias (new joint pains in hands), gait problem and myalgias.  Neurological:  Positive for numbness and headaches. Negative for dizziness.  Psychiatric/Behavioral:  Positive for depression. Negative for dysphoric mood and sleep disturbance. The patient has insomnia. The patient is not nervous/anxious.     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  Tega derm OK    Past Surgical History:  Procedure Laterality Date   ANTERIOR FUSION CERVICAL SPINE  2006   BREAST REDUCTION SURGERY     CHOLECYSTECTOMY  05/05/2019   COLONOSCOPY WITH PROPOFOL N/A 08/24/2020   Procedure: COLONOSCOPY WITH PROPOFOL;  Surgeon: Toledo, Benay Pike, MD;  Location: [facility] ENDOSCOPY;  Service: Gastroenterology;  Laterality: N/A;  DM   ESOPHAGOGASTRODUODENOSCOPY  2011   gastric ulcer   ESOPHAGOGASTRODUODENOSCOPY (EGD) WITH PROPOFOL N/A 08/24/2020   Procedure: ESOPHAGOGASTRODUODENOSCOPY (EGD) WITH PROPOFOL;  Surgeon: Toledo, Benay Pike, MD;  Location: [facility] ENDOSCOPY;  Service: Gastroenterology;  Laterality: N/A;   LITHOTRIPSY  2012   with setnet   LUMBAR [place]     NASAL SINUS SURGERY  2015   partail knee replacement Bilateral    REDUCTION MAMMAPLASTY Bilateral 2011   REPAIR TENDONS FOOT Left    THYROIDECTOMY Bilateral 12/16/2017   Procedure: THYROIDECTOMY;  Surgeon: Margaretha Sheffield, MD;  Location: [facility] ORS;  Service: ENT;  Laterality: Bilateral;   TONSILLECTOMY     TOTAL ABDOMINAL HYSTERECTOMY     Ovaries Remain.   TUBAL LIGATION      Social History   Tobacco Use   Smoking status: Never   Smokeless tobacco: Never  Vaping Use   Vaping Use: Never used  Substance Use Topics   Alcohol use: Yes    Alcohol/week: 0.0 - 1.0 standard drinks of alcohol    Comment: on occassion   Drug use: Not Currently    Types: Marijuana     Medication list has been reviewed and  updated.  Current Meds  Medication Sig   albuterol (VENTOLIN HFA) 108 (90 Base) MCG/ACT inhaler Inhale 2 puffs into the lungs every 6 (six) hours as needed for wheezing or shortness of breath.   alendronate (FOSAMAX) 70 MG tablet Take 70 mg by mouth once a week.   atorvastatin (LIPITOR) 10 MG tablet TAKE (1) TABLET BY MOUTH EVERY DAY   calcium citrate-vitamin D (CITRACAL+D) 315-200 MG-UNIT tablet Take 2 tablets by mouth 2 (two) times daily.   escitalopram (LEXAPRO) 20 MG tablet Take 1.5 tablets (30 mg total) by mouth daily.   estradiol (ESTRACE) 0.1 MG/GM vaginal cream Place vaginally.   Fexofenadine HCl (MUCINEX ALLERGY PO) Take by mouth as needed.   fluticasone (FLONASE) 50 MCG/ACT nasal spray Place 2 sprays into both nostrils daily.   folic acid (FOLVITE) 1 MG tablet Take 1 mg by mouth daily.   hydrochlorothiazide (HYDRODIURIL) 25 MG tablet TAKE (1) TABLET BY MOUTH EVERY DAY   ibuprofen (ADVIL,MOTRIN) 600 MG tablet Take 1 tablet (600 mg total) by mouth every 6 (six) hours as needed.   levothyroxine (SYNTHROID) 175 MCG tablet Take 175 mcg by mouth daily.   lisinopril (ZESTRIL) 10 MG tablet  TAKE (1) TABLET BY MOUTH EVERY DAY   metFORMIN (GLUCOPHAGE-XR) 500 MG 24 hr tablet Take 500 mg by mouth 2 (two) times daily. 2 in AM 2 in PM   metoprolol succinate (TOPROL-XL) 50 MG 24 hr tablet TAKE (1) TABLET BY MOUTH EVERY DAY   MOUNJARO 5 MG/0.5ML Pen Inject into the skin.   MYRBETRIQ 25 MG TB24 tablet    pantoprazole (PROTONIX) 40 MG tablet TAKE (1) TABLET BY MOUTH TWICE DAILY   pregabalin (LYRICA) 75 MG capsule Take by mouth daily. 6 tablets daily   tiZANidine (ZANAFLEX) 4 MG tablet TAKE (1) TABLET BY MOUTH DAILY AS NEEDEDFOR MUSCLE SPASMS.   valACYclovir (VALTREX) 1000 MG tablet Take 1,000 mg by mouth daily.   zaleplon (SONATA) 10 MG capsule TAKE (1) CAPSULE BY MOUTH EVERY NIGHT AT BEDTIME AS NEEDED FOR SLEEP   [DISCONTINUED] predniSONE (DELTASONE) 2.5 MG tablet Take 2.5 mg by mouth daily.        11/15/2021    2:45 PM 03/22/2021    2:43 PM 12/06/2020    1:46 PM 10/21/2020    8:53 AM  GAD 7 : Generalized Anxiety Score  Nervous, Anxious, on Edge 0 1 0 0  Control/stop worrying 0 1 0 0  Worry too much - different things 0 0 0 0  Trouble relaxing 1 0 0 0  Restless 0 0 0 0  Easily annoyed or irritable 0 2 0 0  Afraid - awful might happen 0 0 0 0  Total GAD 7 Score 1 4 0 0  Anxiety Difficulty Not difficult at all  Not difficult at all Not difficult at all       11/15/2021    2:45 PM 03/22/2021    2:42 PM 12/06/2020    1:45 PM  Depression screen PHQ 2/9  Decreased Interest 2 2 0  Down, Depressed, Hopeless 0 0 0  PHQ - 2 Score 2 2 0  Altered sleeping 2 3 0  Tired, decreased energy 2 3   Change in appetite 0 3 0  Feeling bad or failure about yourself  0 1 0  Trouble concentrating 1 1 0  Moving slowly or fidgety/restless 0 0 0  Suicidal thoughts 0 0 0  PHQ-9 Score 7 13 0  Difficult doing work/chores Not difficult at all Very difficult Somewhat difficult    BP Readings from Last 3 Encounters:  11/15/21 128/76  03/22/21 122/78  12/06/20 128/64    Physical Exam Vitals and nursing note reviewed.  Constitutional:      General: She is not in acute distress.    Appearance: Normal appearance. She is well-developed.  HENT:     Head: Normocephalic and atraumatic.  Cardiovascular:     Rate and Rhythm: Normal rate and regular rhythm.  Pulmonary:     Effort: Pulmonary effort is normal. No respiratory distress.     Breath sounds: No wheezing or rhonchi.  Musculoskeletal:        General: Swelling (MCP joints both hands) present.     Cervical back: Normal range of motion.     Right lower leg: No edema.     Left lower leg: No edema.  Lymphadenopathy:     Cervical: No cervical adenopathy.  Skin:    General: Skin is warm and dry.     Capillary Refill: Capillary refill takes less than 2 seconds.     Findings: No rash.  Neurological:     Mental Status: She is alert and  oriented to person, place,  and time.     Gait: Gait normal.     Comments: Targeted neuro exam not done  Psychiatric:        Mood and Affect: Mood normal.        Behavior: Behavior normal.     Wt Readings from Last 3 Encounters:  11/15/21 217 lb (98.4 kg)  03/22/21 245 lb (111.1 kg)  12/06/20 244 lb (110.7 kg)    BP 128/76   Pulse 98   Ht '5\' 6"'  (1.676 m)   Wt 217 lb (98.4 kg)   SpO2 93%   BMI 35.02 kg/m   Assessment and Plan: 1. Essential hypertension Clinically stable exam with well controlled BP. Tolerating medications without side effects at this time. Pt to continue current regimen and low sodium diet; benefits of regular exercise as able discussed. - metoprolol succinate (TOPROL-XL) 50 MG 24 hr tablet; TAKE (1) TABLET BY MOUTH EVERY DAY  Dispense: 90 tablet; Refill: 1 - hydrochlorothiazide (HYDRODIURIL) 25 MG tablet; TAKE (1) TABLET BY MOUTH EVERY DAY  Dispense: 90 tablet; Refill: 1 - lisinopril (ZESTRIL) 10 MG tablet; TAKE (1) TABLET BY MOUTH EVERY DAY  Dispense: 90 tablet; Refill: 1  2. Myeloradiculopathy Some improvement with Neurology care On Pregabalin; may start new med this December  3. Tachycardia HR controlled - metoprolol succinate (TOPROL-XL) 50 MG 24 hr tablet; TAKE (1) TABLET BY MOUTH EVERY DAY  Dispense: 90 tablet; Refill: 1  4. H/O peptic ulcer Symptoms well controlled on daily PPI No red flag signs such as weight loss, n/v, melena Will continue pantoprazole - pantoprazole (PROTONIX) 40 MG tablet; TAKE (1) TABLET BY MOUTH TWICE DAILY  Dispense: 180 tablet; Refill: 1  5. Hyperlipidemia, mild - atorvastatin (LIPITOR) 10 MG tablet; TAKE (1) TABLET BY MOUTH EVERY DAY  Dispense: 90 tablet; Refill: 1 - Comprehensive metabolic panel - Lipid panel  6. Type II diabetes mellitus with complication (HCC) Followed by Endo On Mounjaro with excellent weight loss and A1C response  7. Mood disorder (HCC) Clinically stable on current regimen with good control  of symptoms, No SI or HI. Will continue current therapy. - escitalopram (LEXAPRO) 20 MG tablet; Take 1.5 tablets (30 mg total) by mouth daily.  Dispense: 135 tablet; Refill: 1  8. Idiopathic insomnia Not doing as well with Sonata Sample of Belsomra 10 mg #9 Call for Rx if helpful.  9. Encounter for screening mammogram for breast cancer Due for mammogram Will give order for [facility] since she has [facility]   Partially dictated using [employer]. Any errors are unintentional.  Halina Maidens, MD [facility]  11/15/2021

## 2021-11-16 LAB — COMPREHENSIVE METABOLIC PANEL WITH GFR
ALT: 17 IU/L (ref 0–32)
AST: 25 IU/L (ref 0–40)
Albumin/Globulin Ratio: 2.1 (ref 1.2–2.2)
Albumin: 4.5 g/dL (ref 3.9–4.9)
Alkaline Phosphatase: 98 IU/L (ref 44–121)
BUN/Creatinine Ratio: 6 — ABNORMAL LOW (ref 12–28)
BUN: 6 mg/dL — ABNORMAL LOW (ref 8–27)
Bilirubin Total: 0.6 mg/dL (ref 0.0–1.2)
CO2: 23 mmol/L (ref 20–29)
Calcium: 9.2 mg/dL (ref 8.7–10.3)
Chloride: 102 mmol/L (ref 96–106)
Creatinine, Ser: 0.99 mg/dL (ref 0.57–1.00)
Globulin, Total: 2.1 g/dL (ref 1.5–4.5)
Glucose: 86 mg/dL (ref 70–99)
Potassium: 4 mmol/L (ref 3.5–5.2)
Sodium: 142 mmol/L (ref 134–144)
Total Protein: 6.6 g/dL (ref 6.0–8.5)
eGFR: 65 mL/min/1.73 (ref >59)

## 2021-11-16 LAB — LIPID PANEL
Chol/HDL Ratio: 3.4 ratio (ref 0.0–4.4)
Cholesterol, Total: 154 mg/dL (ref 100–199)
HDL: 45 mg/dL (ref >39)
LDL Chol Calc (NIH): 82 mg/dL (ref 0–99)
Triglycerides: 158 mg/dL — ABNORMAL HIGH (ref 0–149)
VLDL Cholesterol Cal: 27 mg/dL (ref 5–40)

## 2021-11-16 NOTE — Progress Notes (Signed)
PC to pt, discussed lab results. Will call back with any questions or concerns.

## 2021-11-20 ENCOUNTER — Encounter: Payer: Self-pay | Admitting: Internal Medicine

## 2021-11-20 ENCOUNTER — Ambulatory Visit: Payer: BLUE CROSS/BLUE SHIELD | Admitting: Internal Medicine

## 2021-11-21 ENCOUNTER — Other Ambulatory Visit: Payer: Self-pay | Admitting: Internal Medicine

## 2021-11-21 DIAGNOSIS — F5101 Primary insomnia: Secondary | ICD-10-CM

## 2021-11-21 MED ORDER — BELSOMRA 10 MG PO TABS: 10.0000 mg | ORAL_TABLET | Freq: Every evening | ORAL | 2 refills | Status: DC | PRN

## 2021-11-21 NOTE — Telephone Encounter (Signed)
Please review.  KP

## 2021-12-20 ENCOUNTER — Telehealth: Payer: Self-pay | Admitting: Physician Assistant

## 2021-12-20 DIAGNOSIS — J019 Acute sinusitis, unspecified: Secondary | ICD-10-CM

## 2021-12-20 DIAGNOSIS — B9689 Other specified bacterial agents as the cause of diseases classified elsewhere: Secondary | ICD-10-CM

## 2021-12-20 DIAGNOSIS — R051 Acute cough: Secondary | ICD-10-CM

## 2021-12-20 MED ORDER — AMOXICILLIN-POT CLAVULANATE 875-125 MG PO TABS: 1.0000 | ORAL_TABLET | Freq: Two times a day (BID) | ORAL | 0 refills | Status: DC

## 2021-12-20 MED ORDER — PROMETHAZINE-DM 6.25-15 MG/5ML PO SYRP: 5.0000 mL | ORAL_SOLUTION | Freq: Four times a day (QID) | ORAL | 0 refills | Status: DC | PRN

## 2021-12-20 MED ORDER — FLUTICASONE PROPIONATE 50 MCG/ACT NA SUSP: 2.0000 | Freq: Every day | NASAL | 0 refills | Status: AC

## 2021-12-20 NOTE — Progress Notes (Signed)
Virtual Visit Consent   Gabrielle Tucker, you are scheduled for a virtual visit with a [facility] provider today. Just as with appointments in the office, your consent must be obtained to participate. Your consent will be active for this visit and any virtual visit you may have with one of our providers in the next 365 days. If you have a MyChart account, a copy of this consent can be sent to you electronically.  As this is a virtual visit, video technology does not allow for your provider to perform a traditional examination. This may limit your provider's ability to fully assess your condition. If your provider identifies any concerns that need to be evaluated in person or the need to arrange testing (such as labs, EKG, etc.), we will make arrangements to do so. Although advances in technology are sophisticated, we cannot ensure that it will always work on either your end or our end. If the connection with a video visit is poor, the visit may have to be switched to a telephone visit. With either a video or telephone visit, we are not always able to ensure that we have a secure connection.  By engaging in this virtual visit, you consent to the provision of healthcare and authorize for your insurance to be billed (if applicable) for the services provided during this visit. Depending on your insurance coverage, you may receive a charge related to this service.  I need to obtain your verbal consent now. Are you willing to proceed with your visit today? Gabrielle Tucker has provided verbal consent on 12/20/2021 for a virtual visit (video or telephone). Mar Daring, PA-C  Date: 12/20/2021 12:59 PM  Virtual Visit via Video Note   I, Mar Daring, connected with  Gabrielle Tucker  ([id number], 1960/06/11) on 12/20/21 at 12:45 PM EST by a video-enabled telemedicine application and verified that I am speaking with the correct person using two identifiers.  Location: Patient: Virtual  Visit Location Patient: Home Provider: Virtual Visit Location Provider: Home Office   I discussed the limitations of evaluation and management by telemedicine and the availability of in person appointments. The patient expressed understanding and agreed to proceed.    History of Present Illness: Gabrielle Tucker is a 61 y.o. who identifies as a female who was assigned female at birth, and is being seen today for possible sinus infection.  HPI: Sinusitis This is a new problem. The current episode started in the past 7 days. The problem has been gradually worsening since onset. Maximum temperature: 99.6. The fever has been present for Less than 1 day. The pain is moderate. Associated symptoms include chills, congestion, coughing, ear pain (left ear), headaches, sinus pressure (worse with bending forward) and a sore throat (mild feels from drainage). Pertinent negatives include no hoarse voice or neck pain. Treatments tried: sudafed 12 hr, sinus rinse, floanse. The treatment provided no relief.      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  [facility] derm OK    Medications:  Current Outpatient Medications:    amoxicillin-clavulanate (AUGMENTIN) 875-125 MG tablet, Take 1 tablet by mouth 2 (two) times daily., Disp: 14 tablet, Rfl: 0   fluticasone (FLONASE) 50 MCG/ACT nasal spray, Place 2 sprays into both nostrils daily., Disp: 16 g, Rfl: 0   promethazine-dextromethorphan (PROMETHAZINE-DM) 6.25-15 MG/5ML syrup, Take 5 mLs by mouth 4 (four) times daily as needed., Disp: 118 mL, Rfl: 0   albuterol (VENTOLIN HFA) 108 (90 Base) MCG/ACT inhaler, Inhale 2 puffs into the lungs every 6 (six) hours as needed for wheezing or shortness of breath., Disp: 1 each, Rfl: 0   alendronate (FOSAMAX) 70 MG tablet, Take 70 mg by mouth once a week., Disp: , Rfl:    atorvastatin (LIPITOR) 10 MG tablet, TAKE (1) TABLET BY MOUTH EVERY DAY, Disp: 90 tablet, Rfl: 1   calcium citrate-vitamin D (CITRACAL+D) 315-200 MG-UNIT tablet, Take 2 tablets by mouth 2 (two) times daily., Disp: 360 tablet, Rfl: 1   escitalopram (LEXAPRO) 20 MG tablet, Take 1.5 tablets (30 mg total) by mouth daily., Disp: 135 tablet, Rfl: 1   estradiol (ESTRACE) 0.1 MG/GM vaginal cream, Place vaginally., Disp: , Rfl:    Fexofenadine HCl (MUCINEX ALLERGY PO), Take by mouth as needed., Disp: , Rfl:    folic acid (FOLVITE) 1 MG tablet, Take 1 mg by mouth daily., Disp: , Rfl:    hydrochlorothiazide (HYDRODIURIL) 25 MG tablet, TAKE (1) TABLET BY MOUTH EVERY DAY, Disp: 90 tablet, Rfl: 1   ibuprofen (ADVIL,MOTRIN) 600 MG tablet, Take 1 tablet (600 mg total) by mouth every 6 (six) hours as needed., Disp: 30 tablet, Rfl: 0   levothyroxine (SYNTHROID) 175 MCG tablet, Take 175 mcg by mouth daily., Disp: , Rfl:    lisinopril (ZESTRIL) 10 MG tablet, TAKE (1) TABLET BY MOUTH EVERY DAY, Disp: 90 tablet, Rfl: 1   metFORMIN (GLUCOPHAGE-XR) 500 MG 24 hr tablet, Take 500 mg by mouth 2 (two) times daily. 2 in AM 2 in PM, Disp: , Rfl:    metoprolol succinate (TOPROL-XL) 50 MG 24 hr tablet, TAKE (1) TABLET BY MOUTH EVERY DAY, Disp: 90  tablet, Rfl: 1   MOUNJARO 5 MG/0.5ML Pen, Inject into the skin., Disp: , Rfl:    MYRBETRIQ 25 MG TB24 tablet, , Disp: , Rfl:    pantoprazole (PROTONIX) 40 MG tablet, TAKE (1) TABLET BY MOUTH TWICE DAILY, Disp: 180 tablet, Rfl: 1   pregabalin (LYRICA) 75 MG capsule, Take by mouth daily. 6 tablets daily, Disp: , Rfl:    Suvorexant (BELSOMRA) 10 MG TABS, Take 10 mg by mouth at bedtime as needed., Disp: 30 tablet, Rfl: 2   tiZANidine (ZANAFLEX) 4 MG tablet, TAKE (1) TABLET BY MOUTH DAILY AS NEEDEDFOR MUSCLE SPASMS., Disp: 12 tablet, Rfl: 0   valACYclovir (VALTREX) 1000 MG tablet, Take 1,000 mg by mouth daily., Disp: , Rfl:    zaleplon (SONATA) 10 MG capsule, TAKE (1) CAPSULE BY MOUTH EVERY NIGHT AT BEDTIME AS NEEDED FOR SLEEP, Disp: 30 capsule, Rfl: 2  Observations/Objective: Patient is well-developed, well-nourished in no acute distress.  Resting comfortably at home.  Head is normocephalic, atraumatic.  No labored breathing.  Speech is clear and coherent with logical content.  Patient is alert and oriented at baseline.    Assessment and Plan: 1. Acute bacterial sinusitis - amoxicillin-clavulanate (AUGMENTIN) 875-125 MG tablet; Take 1 tablet by mouth 2 (two) times daily.  Dispense: 14 tablet; Refill: 0 - fluticasone (FLONASE) 50 MCG/ACT nasal spray; Place 2 sprays into both nostrils daily.  Dispense: 16 g; Refill: 0  2. Acute cough - promethazine-dextromethorphan (PROMETHAZINE-DM) 6.25-15 MG/5ML syrup; Take 5 mLs by mouth 4 (four) times daily as needed.  Dispense: 118 mL; Refill: 0  - Worsening symptoms that have not responded to OTC medications.  - Will give Augmentin - Continue allergy medications; flonase refilled. - Promethazine DM for cough  - Steam and humidifier can help - Stay well hydrated and get plenty of rest.  - Seek in person evaluation if no symptom improvement or if symptoms worsen   Follow Up Instructions: I discussed the assessment and treatment plan with the  patient. The patient was provided an opportunity to ask questions and all were answered. The patient agreed with the plan and demonstrated an understanding of the instructions.  A copy of instructions were sent to the patient via MyChart unless otherwise noted below.    The patient was advised to call back or seek an in-person evaluation if the symptoms worsen or if the condition fails to improve as anticipated.  Time:  I spent 8 minutes with the patient via telehealth technology discussing the above problems/concerns.    Mar Daring, PA-C

## 2021-12-20 NOTE — Patient Instructions (Signed)
Gabrielle Tucker, thank you for joining Mar Daring, PA-C for today's virtual visit.  While this provider is not your primary care provider (PCP), if your PCP is located in our provider database this encounter information will be shared with them immediately following your visit.   [facility] account gives you access to today's visit and all your visits, tests, and labs performed at [facility] " click here if you don't have a [facility] account or go to [email or web address]  Consent: (Patient) Gabrielle Tucker provided verbal consent for this virtual visit at the beginning of the encounter.  Current Medications:  Current Outpatient Medications:    amoxicillin-clavulanate (AUGMENTIN) 875-125 MG tablet, Take 1 tablet by mouth 2 (two) times daily., Disp: 14 tablet, Rfl: 0   fluticasone (FLONASE) 50 MCG/ACT nasal spray, Place 2 sprays into both nostrils daily., Disp: 16 g, Rfl: 0   promethazine-dextromethorphan (PROMETHAZINE-DM) 6.25-15 MG/5ML syrup, Take 5 mLs by mouth 4 (four) times daily as needed., Disp: 118 mL, Rfl: 0   albuterol (VENTOLIN HFA) 108 (90 Base) MCG/ACT inhaler, Inhale 2 puffs into the lungs every 6 (six) hours as needed for wheezing or shortness of breath., Disp: 1 each, Rfl: 0   alendronate (FOSAMAX) 70 MG tablet, Take 70 mg by mouth once a week., Disp: , Rfl:    atorvastatin (LIPITOR) 10 MG tablet, TAKE (1) TABLET BY MOUTH EVERY DAY, Disp: 90 tablet, Rfl: 1   calcium citrate-vitamin D (CITRACAL+D) 315-200 MG-UNIT tablet, Take 2 tablets by mouth 2 (two) times daily., Disp: 360 tablet, Rfl: 1   escitalopram (LEXAPRO) 20 MG tablet, Take 1.5 tablets (30 mg total) by mouth daily., Disp: 135 tablet, Rfl: 1   estradiol (ESTRACE) 0.1 MG/GM vaginal cream, Place vaginally., Disp: , Rfl:    Fexofenadine HCl (MUCINEX ALLERGY PO), Take by mouth as needed., Disp: , Rfl:    folic acid (FOLVITE) 1 MG tablet, Take 1 mg by mouth daily., Disp: ,  Rfl:    hydrochlorothiazide (HYDRODIURIL) 25 MG tablet, TAKE (1) TABLET BY MOUTH EVERY DAY, Disp: 90 tablet, Rfl: 1   ibuprofen (ADVIL,MOTRIN) 600 MG tablet, Take 1 tablet (600 mg total) by mouth every 6 (six) hours as needed., Disp: 30 tablet, Rfl: 0   levothyroxine (SYNTHROID) 175 MCG tablet, Take 175 mcg by mouth daily., Disp: , Rfl:    lisinopril (ZESTRIL) 10 MG tablet, TAKE (1) TABLET BY MOUTH EVERY DAY, Disp: 90 tablet, Rfl: 1   metFORMIN (GLUCOPHAGE-XR) 500 MG 24 hr tablet, Take 500 mg by mouth 2 (two) times daily. 2 in AM 2 in PM, Disp: , Rfl:    metoprolol succinate (TOPROL-XL) 50 MG 24 hr tablet, TAKE (1) TABLET BY MOUTH EVERY DAY, Disp: 90 tablet, Rfl: 1   MOUNJARO 5 MG/0.5ML Pen, Inject into the skin., Disp: , Rfl:    MYRBETRIQ 25 MG TB24 tablet, , Disp: , Rfl:    pantoprazole (PROTONIX) 40 MG tablet, TAKE (1) TABLET BY MOUTH TWICE DAILY, Disp: 180 tablet, Rfl: 1   pregabalin (LYRICA) 75 MG capsule, Take by mouth daily. 6 tablets daily, Disp: , Rfl:    Suvorexant (BELSOMRA) 10 MG TABS, Take 10 mg by mouth at bedtime as needed., Disp: 30 tablet, Rfl: 2   tiZANidine (ZANAFLEX) 4 MG tablet, TAKE (1) TABLET BY MOUTH DAILY AS NEEDEDFOR MUSCLE SPASMS., Disp: 12 tablet, Rfl: 0   valACYclovir (VALTREX) 1000 MG tablet, Take 1,000 mg by mouth daily., Disp: , Rfl:    zaleplon (SONATA)  10 MG capsule, TAKE (1) CAPSULE BY MOUTH EVERY NIGHT AT BEDTIME AS NEEDED FOR SLEEP, Disp: 30 capsule, Rfl: 2   Medications ordered in this encounter:  Meds ordered this encounter  Medications   amoxicillin-clavulanate (AUGMENTIN) 875-125 MG tablet    Sig: Take 1 tablet by mouth 2 (two) times daily.    Dispense:  14 tablet    Refill:  0    Order Specific Question:   Supervising Provider    Answer:   Chase Picket [[id number]]   fluticasone (FLONASE) 50 MCG/ACT nasal spray    Sig: Place 2 sprays into both nostrils daily.    Dispense:  16 g    Refill:  0    Order Specific Question:   Supervising Provider     Answer:   Chase Picket [id number]   promethazine-dextromethorphan (PROMETHAZINE-DM) 6.25-15 MG/5ML syrup    Sig: Take 5 mLs by mouth 4 (four) times daily as needed.    Dispense:  118 mL    Refill:  0    Order Specific Question:   Supervising Provider    Answer:   Chase Picket [[id number]]     *If you need refills on other medications prior to your next appointment, please contact your pharmacy*  Follow-Up: Call back or seek an in-person evaluation if the symptoms worsen or if the condition fails to improve as anticipated.  [name] [phone]  Other Instructions  Sinus Infection, Adult A sinus infection, also called sinusitis, is inflammation of your sinuses. Sinuses are hollow spaces in the bones around your face. Your sinuses are located: Around your eyes. In the middle of your forehead. Behind your nose. In your cheekbones. Mucus normally drains out of your sinuses. When your nasal tissues become inflamed or swollen, mucus can become trapped or blocked. This allows bacteria, viruses, and fungi to grow, which leads to infection. Most infections of the sinuses are caused by a virus. A sinus infection can develop quickly. It can last for up to 4 weeks (acute) or for more than 12 weeks (chronic). A sinus infection often develops after a cold. What are the causes? This condition is caused by anything that creates swelling in the sinuses or stops mucus from draining. This includes: Allergies. Asthma. Infection from bacteria or viruses. Deformities or blockages in your nose or sinuses. Abnormal growths in the nose (nasal polyps). Pollutants, such as chemicals or irritants in the air. Infection from fungi. This is rare. What increases the risk? You are more likely to develop this condition if you: Have a weak body defense system (immune system). Do a lot of swimming or diving. Overuse nasal sprays. Smoke. What are the signs or symptoms? The main  symptoms of this condition are pain and a feeling of pressure around the affected sinuses. Other symptoms include: Stuffy nose or congestion that makes it difficult to breathe through your nose. Thick yellow or greenish drainage from your nose. Tenderness, swelling, and warmth over the affected sinuses. A cough that may get worse at night. Decreased sense of smell and taste. Extra mucus that collects in the throat or the back of the nose (postnasal drip) causing a sore throat or bad breath. Tiredness (fatigue). Fever. How is this diagnosed? This condition is diagnosed based on: Your symptoms. Your medical history. A physical exam. Tests to find out if your condition is acute or chronic. This may include: Checking your nose for nasal polyps. Viewing your sinuses using a device that has a  light (endoscope). Testing for allergies or bacteria. Imaging tests, such as an MRI or CT scan. In rare cases, a bone biopsy may be done to rule out more serious types of fungal sinus disease. How is this treated? Treatment for a sinus infection depends on the cause and whether your condition is chronic or acute. If caused by a virus, your symptoms should go away on their own within 10 days. You may be given medicines to relieve symptoms. They include: Medicines that shrink swollen nasal passages (decongestants). A spray that eases inflammation of the nostrils (topical intranasal corticosteroids). Rinses that help get rid of thick mucus in your nose (nasal saline washes). Medicines that treat allergies (antihistamines). Over-the-counter pain relievers. If caused by bacteria, your health care provider may recommend waiting to see if your symptoms improve. Most bacterial infections will get better without antibiotic medicine. You may be given antibiotics if you have: A severe infection. A weak immune system. If caused by narrow nasal passages or nasal polyps, surgery may be needed. Follow these  instructions at home: Medicines Take, use, or apply over-the-counter and prescription medicines only as told by your health care provider. These may include nasal sprays. If you were prescribed an antibiotic medicine, take it as told by your health care provider. Do not stop taking the antibiotic even if you start to feel better. Hydrate and humidify  Drink enough fluid to keep your urine pale yellow. Staying hydrated will help to thin your mucus. Use a cool mist humidifier to keep the humidity level in your home above 50%. Inhale steam for 10-15 minutes, 3-4 times a day, or as told by your health care provider. You can do this in the bathroom while a hot shower is running. Limit your exposure to cool or dry air. Rest Rest as much as possible. Sleep with your head raised (elevated). Make sure you get enough sleep each night. General instructions  Apply a warm, moist washcloth to your face 3-4 times a day or as told by your health care provider. This will help with discomfort. Use nasal saline washes as often as told by your health care provider. Wash your hands often with soap and water to reduce your exposure to germs. If soap and water are not available, use hand sanitizer. Do not smoke. Avoid being around people who are smoking (secondhand smoke). Keep all follow-up visits. This is important. Contact a health care provider if: You have a fever. Your symptoms get worse. Your symptoms do not improve within 10 days. Get help right away if: You have a severe headache. You have persistent vomiting. You have severe pain or swelling around your face or eyes. You have vision problems. You develop confusion. Your neck is stiff. You have trouble breathing. These symptoms may be an emergency. Get help right away. Call 911. Do not wait to see if the symptoms will go away. Do not drive yourself to the hospital. Summary A sinus infection is soreness and inflammation of your sinuses. Sinuses  are hollow spaces in the bones around your face. This condition is caused by nasal tissues that become inflamed or swollen. The swelling traps or blocks the flow of mucus. This allows bacteria, viruses, and fungi to grow, which leads to infection. If you were prescribed an antibiotic medicine, take it as told by your health care provider. Do not stop taking the antibiotic even if you start to feel better. Keep all follow-up visits. This is important. This information is not intended to  replace advice given to you by your health care provider. Make sure you discuss any questions you have with your health care provider. Document Revised: 12/13/2020 Document Reviewed: 12/13/2020 Elsevier Patient Education  [employer].    If you have been instructed to have an in-person evaluation today at a local Urgent Care facility, please use the link below. It will take you to a list of all of our available [facility], including address, phone number and hours of operation. Please do not delay care.  [facility]  If you or a family member do not have a primary care provider, use the link below to schedule a visit and establish care. When you choose a [facility] primary care physician or advanced practice provider, you gain a long-term partner in health. Find a Primary Care Provider  Learn more about [facility] in-office and virtual care options: [facility]

## 2022-02-06 LAB — HM DIABETES EYE EXAM

## 2022-02-13 DIAGNOSIS — M8580 Other specified disorders of bone density and structure, unspecified site: Secondary | ICD-10-CM | POA: Insufficient documentation

## 2022-02-13 LAB — HEMOGLOBIN A1C: Hemoglobin A1C: 5.1

## 2022-02-15 ENCOUNTER — Encounter: Payer: Self-pay | Admitting: Internal Medicine

## 2022-02-16 ENCOUNTER — Other Ambulatory Visit: Payer: Self-pay | Admitting: Internal Medicine

## 2022-02-16 DIAGNOSIS — F5101 Primary insomnia: Secondary | ICD-10-CM

## 2022-02-16 MED ORDER — ZALEPLON 10 MG PO CAPS: ORAL_CAPSULE | ORAL | 2 refills | Status: DC

## 2022-02-16 NOTE — Telephone Encounter (Signed)
Pt response.  KP

## 2022-02-16 NOTE — Telephone Encounter (Signed)
Please review.  KP

## 2022-02-19 ENCOUNTER — Other Ambulatory Visit: Payer: Self-pay | Admitting: Internal Medicine

## 2022-02-19 DIAGNOSIS — F5101 Primary insomnia: Secondary | ICD-10-CM

## 2022-02-19 MED ORDER — ZALEPLON 10 MG PO CAPS: ORAL_CAPSULE | ORAL | 2 refills | Status: DC

## 2022-02-19 NOTE — Telephone Encounter (Signed)
Pt response.  KP

## 2022-03-10 ENCOUNTER — Telehealth: Payer: BLUE CROSS/BLUE SHIELD | Admitting: Family Medicine

## 2022-03-10 DIAGNOSIS — U071 COVID-19: Secondary | ICD-10-CM | POA: Diagnosis not present

## 2022-03-10 DIAGNOSIS — R051 Acute cough: Secondary | ICD-10-CM

## 2022-03-10 MED ORDER — NIRMATRELVIR/RITONAVIR (PAXLOVID)TABLET: 3.0000 | ORAL_TABLET | Freq: Two times a day (BID) | ORAL | 0 refills | Status: DC

## 2022-03-10 MED ORDER — PROMETHAZINE-DM 6.25-15 MG/5ML PO SYRP: 5.0000 mL | ORAL_SOLUTION | Freq: Four times a day (QID) | ORAL | 0 refills | Status: DC | PRN

## 2022-03-10 NOTE — Patient Instructions (Signed)

## 2022-03-10 NOTE — Progress Notes (Signed)
Virtual Visit Consent   Gabrielle Tucker, you are scheduled for a virtual visit with a [facility] provider today. Just as with appointments in the office, your consent must be obtained to participate. Your consent will be active for this visit and any virtual visit you may have with one of our providers in the next 365 days. If you have a MyChart account, a copy of this consent can be sent to you electronically.  As this is a virtual visit, video technology does not allow for your provider to perform a traditional examination. This may limit your provider's ability to fully assess your condition. If your provider identifies any concerns that need to be evaluated in person or the need to arrange testing (such as labs, EKG, etc.), we will make arrangements to do so. Although advances in technology are sophisticated, we cannot ensure that it will always work on either your end or our end. If the connection with a video visit is poor, the visit may have to be switched to a telephone visit. With either a video or telephone visit, we are not always able to ensure that we have a secure connection.  By engaging in this virtual visit, you consent to the provision of healthcare and authorize for your insurance to be billed (if applicable) for the services provided during this visit. Depending on your insurance coverage, you may receive a charge related to this service.  I need to obtain your verbal consent now. Are you willing to proceed with your visit today? [name] has provided verbal consent on 03/10/2022 for a virtual visit (video or telephone). Dellia Nims, FNP  Date: 03/10/2022 1:04 PM  Virtual Visit via Video Note   I, Dellia Nims, connected with  Gabrielle Tucker  ([id number], 27-Mar-1960) on 03/10/22 at  1:00 PM EST by a video-enabled telemedicine application and verified that I am speaking with the correct person using two identifiers.  Location: Patient: Virtual Visit Location  Patient: Home Provider: Virtual Visit Location Provider: Home Office   I discussed the limitations of evaluation and management by telemedicine and the availability of in person appointments. The patient expressed understanding and agreed to proceed.    History of Present Illness: Gabrielle Tucker is a 62 y.o. who identifies as a female who was assigned female at birth, and is being seen today for exposure to covid from her daughter. She now has since Thursday cough, fever, chills, body aches. Worsening. Marland Kitchen  HPI: HPI  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   Osteopenia of hip 09/05/2020   On corticosteroid therapy 08/05/2020   Status post hysterectomy 03/01/2020   Type II diabetes mellitus with complication ([place]) [id number]   Mood disorder ([facility]) 01/07/2019   S/P total thyroidectomy 12/16/2017   Multiple thyroid nodules 11/12/2017   Hyperlipidemia, mild 08/03/2016   Arthritis of knee, left 08/02/2016   Back muscle spasm 07/03/2014   H/O peptic ulcer 07/03/2014   Migraine without aura and responsive to treatment 07/03/2014   Muscle contraction headache 07/03/2014   Idiopathic insomnia 07/03/2014   Tachycardia 07/03/2014    Allergies:  Allergies  Allergen Reactions   Morphine And Related Nausea And Vomiting   Eggs Or Egg-Derived Products    Tape Hives and Rash    No [paper tape.  [facility] OK   Medications:  Current Outpatient Medications:    albuterol (VENTOLIN HFA) 108 (90 Base) MCG/ACT  inhaler, Inhale 2 puffs into the lungs every 6 (six) hours as needed for wheezing or shortness of breath., Disp: 1 each, Rfl: 0   alendronate (FOSAMAX) 70 MG tablet, Take 70 mg by mouth once a week., Disp: , Rfl:    amoxicillin-clavulanate (AUGMENTIN) 875-125 MG tablet, Take 1 tablet by mouth 2 (two) times daily., Disp: 14 tablet, Rfl: 0   atorvastatin (LIPITOR) 10 MG tablet, TAKE (1) TABLET BY  MOUTH EVERY DAY, Disp: 90 tablet, Rfl: 1   calcium citrate-vitamin D (CITRACAL+D) 315-200 MG-UNIT tablet, Take 2 tablets by mouth 2 (two) times daily., Disp: 360 tablet, Rfl: 1   escitalopram (LEXAPRO) 20 MG tablet, Take 1.5 tablets (30 mg total) by mouth daily., Disp: 135 tablet, Rfl: 1   estradiol (ESTRACE) 0.1 MG/GM vaginal cream, Place vaginally., Disp: , Rfl:    Fexofenadine HCl (MUCINEX ALLERGY PO), Take by mouth as needed., Disp: , Rfl:    fluticasone (FLONASE) 50 MCG/ACT nasal spray, Place 2 sprays into both nostrils daily., Disp: 16 g, Rfl: 0   folic acid (FOLVITE) 1 MG tablet, Take 1 mg by mouth daily., Disp: , Rfl:    hydrochlorothiazide (HYDRODIURIL) 25 MG tablet, TAKE (1) TABLET BY MOUTH EVERY DAY, Disp: 90 tablet, Rfl: 1   ibuprofen (ADVIL,MOTRIN) 600 MG tablet, Take 1 tablet (600 mg total) by mouth every 6 (six) hours as needed., Disp: 30 tablet, Rfl: 0   levothyroxine (SYNTHROID) 175 MCG tablet, Take 175 mcg by mouth daily., Disp: , Rfl:    lisinopril (ZESTRIL) 10 MG tablet, TAKE (1) TABLET BY MOUTH EVERY DAY, Disp: 90 tablet, Rfl: 1   metFORMIN (GLUCOPHAGE-XR) 500 MG 24 hr tablet, Take 500 mg by mouth 2 (two) times daily. 2 in AM 2 in PM, Disp: , Rfl:    metoprolol succinate (TOPROL-XL) 50 MG 24 hr tablet, TAKE (1) TABLET BY MOUTH EVERY DAY, Disp: 90 tablet, Rfl: 1   MOUNJARO 5 MG/0.5ML Pen, Inject into the skin., Disp: , Rfl:    MYRBETRIQ 25 MG TB24 tablet, , Disp: , Rfl:    nirmatrelvir/ritonavir (PAXLOVID) 20 x 150 MG & 10 x 100MG TABS, Take 3 tablets by mouth 2 (two) times daily for 5 days. (Take nirmatrelvir 150 mg two tablets twice daily for 5 days and ritonavir 100 mg one tablet twice daily for 5 days) Patient GFR is normal, Disp: 30 tablet, Rfl: 0   pantoprazole (PROTONIX) 40 MG tablet, TAKE (1) TABLET BY MOUTH TWICE DAILY, Disp: 180 tablet, Rfl: 1   pregabalin (LYRICA) 75 MG capsule, Take by mouth daily. 6 tablets daily, Disp: , Rfl:    promethazine-dextromethorphan  (PROMETHAZINE-DM) 6.25-15 MG/5ML syrup, Take 5 mLs by mouth 4 (four) times daily as needed., Disp: 118 mL, Rfl: 0   tiZANidine (ZANAFLEX) 4 MG tablet, TAKE (1) TABLET BY MOUTH DAILY AS NEEDEDFOR MUSCLE SPASMS., Disp: 12 tablet, Rfl: 0   valACYclovir (VALTREX) 1000 MG tablet, Take 1,000 mg by mouth daily., Disp: , Rfl:    zaleplon (SONATA) 10 MG capsule, TAKE (1) CAPSULE BY MOUTH EVERY NIGHT AT BEDTIME AS NEEDED FOR SLEEP, Disp: 30 capsule, Rfl: 2  Observations/Objective: Patient is well-developed, well-nourished in no acute distress.  Resting comfortably  at home.  Head is normocephalic, atraumatic.  No labored breathing.  Speech is clear and coherent with logical content.  Patient is alert and oriented at baseline.    Assessment and Plan: 1. COVID  2. Acute cough - promethazine-dextromethorphan (PROMETHAZINE-DM) 6.25-15 MG/5ML syrup; Take 5 mLs by mouth 4 (  four) times daily as needed.  Dispense: 118 mL; Refill: 0  Increase fluids, humidifier at night, quarantine discussed, urgent care if needed.   Follow Up Instructions: I discussed the assessment and treatment plan with the patient. The patient was provided an opportunity to ask questions and all were answered. The patient agreed with the plan and demonstrated an understanding of the instructions.  A copy of instructions were sent to the patient via MyChart unless otherwise noted below.     The patient was advised to call back or seek an in-person evaluation if the symptoms worsen or if the condition fails to improve as anticipated.  Time:  I spent 10 minutes with the patient via telehealth technology discussing the above problems/concerns.    Dellia Nims, FNP

## 2022-03-13 ENCOUNTER — Ambulatory Visit: Payer: BLUE CROSS/BLUE SHIELD | Admitting: Internal Medicine

## 2022-03-13 ENCOUNTER — Encounter: Payer: Self-pay | Admitting: Internal Medicine

## 2022-03-13 VITALS — BP 112/62 | HR 105 | Temp 98.7°F | Ht 66.0 in | Wt 203.0 lb

## 2022-03-13 DIAGNOSIS — J069 Acute upper respiratory infection, unspecified: Secondary | ICD-10-CM

## 2022-03-13 MED ORDER — ALBUTEROL SULFATE HFA 108 (90 BASE) MCG/ACT IN AERS: 2.0000 | INHALATION_SPRAY | Freq: Four times a day (QID) | RESPIRATORY_TRACT | 0 refills | Status: DC | PRN

## 2022-03-13 MED ORDER — CEFDINIR 300 MG PO CAPS: 300.0000 mg | ORAL_CAPSULE | Freq: Two times a day (BID) | ORAL | 0 refills | Status: AC

## 2022-03-13 NOTE — Progress Notes (Signed)
Date:  03/13/2022   Name:  Gabrielle Tucker   DOB:  [birth year]   MRN:  [id number]   Chief Complaint: Cough (Cough started Thursday. Tested self for Covid Friday and it was negative. Did VV Saturday and was told she may have covid so she was given Paxlovid. Patient is not taking paxlovid because its causing her upset stomach.)  Cough This is a new problem. Episode onset: 5 days. The cough is Productive of sputum. Associated symptoms include ear pain, a fever, myalgias, nasal congestion, a sore throat and shortness of breath. The symptoms are aggravated by lying down.    Lab Results  Component Value Date   NA 142 11/15/2021   K 4.0 11/15/2021   CO2 23 11/15/2021   GLUCOSE 86 11/15/2021   BUN 6 (L) 11/15/2021   CREATININE 0.99 11/15/2021   CALCIUM 9.2 11/15/2021   EGFR 65 11/15/2021   GFRNONAA 92 03/18/2019   Lab Results  Component Value Date   CHOL 154 11/15/2021   HDL 45 11/15/2021   LDLCALC 82 11/15/2021   TRIG 158 (H) 11/15/2021   CHOLHDL 3.4 11/15/2021   Lab Results  Component Value Date   TSH 0.02 (A) 09/26/2021   Lab Results  Component Value Date   HGBA1C 5.1 02/13/2022   Lab Results  Component Value Date   WBC 11.5 04/27/2021   HGB 12.7 04/27/2021   HCT 37 04/27/2021   MCV 92 01/07/2019   PLT 221 04/27/2021   Lab Results  Component Value Date   ALT 17 11/15/2021   AST 25 11/15/2021   ALKPHOS 98 11/15/2021   BILITOT 0.6 11/15/2021   No results found for: "25OHVITD2", "25OHVITD3", "VD25OH"   Review of Systems  Constitutional:  Positive for fever.  HENT:  Positive for ear pain and sore throat.   Respiratory:  Positive for cough and shortness of breath.   Musculoskeletal:  Positive for myalgias.    Patient Active Problem List   Diagnosis Date Noted   Myeloradiculopathy 07/19/2021   Post-surgical hypothyroidism 03/22/2021   Osteopenia of hip 09/05/2020   On corticosteroid therapy 08/05/2020   Status post hysterectomy 03/01/2020   Type II  diabetes mellitus with complication ([place]) 123XX123   Mood disorder ([place]) 01/07/2019   S/P total thyroidectomy 12/16/2017   Multiple thyroid nodules 11/12/2017   Hyperlipidemia, mild 08/03/2016   Arthritis of knee, left 08/02/2016   Back muscle spasm 07/03/2014   H/O peptic ulcer 07/03/2014   Migraine without aura and responsive to treatment 07/03/2014   Muscle contraction headache 07/03/2014   Idiopathic insomnia 07/03/2014   Tachycardia 07/03/2014    Allergies  Allergen Reactions   Morphine And Related Nausea And Vomiting   Eggs Or Egg-Derived Products    Tape Hives and Rash    No [paper tape.  Tega derm OK    Past Surgical History:  Procedure Laterality Date   ANTERIOR FUSION CERVICAL SPINE  2006   BREAST REDUCTION SURGERY     CHOLECYSTECTOMY  05/05/2019   COLONOSCOPY WITH PROPOFOL N/A 08/24/2020   Procedure: COLONOSCOPY WITH PROPOFOL;  Surgeon: Toledo, Benay Pike, MD;  Location: [facility] END[facility]COPY;  Service: Gastroenterology;  Laterality: N/A;  DM   ESOPHAGOGASTRODUODENOSCOPY  2011   gastric ulcer   ESOPHAGOGASTRODUODENOSCOPY (EGD) WITH PROPOFOL N/A 08/24/2020   Procedure: ESOPHAGOGASTRODUODENOSCOPY (EGD) WITH PROPOFOL;  Surgeon: Toledo, Benay Pike, MD;  Location: [facility] ENDOSCOPY;  Service: Gastroenterology;  Laterality: N/A;   LITHOTRIPSY  2012   with setnet   LUMBAR DISC  SURGERY     NASAL SINUS SURGERY  2015   partail knee replacement Bilateral    REDUCTION MAMMAPLASTY Bilateral 2011   REPAIR TENDONS FOOT Left    THYROIDECTOMY Bilateral 12/16/2017   Procedure: THYROIDECTOMY;  Surgeon: Margaretha Sheffield, MD;  Location: [facility] ORS;  Service: ENT;  Laterality: Bilateral;   TONSILLECTOMY     TOTAL ABDOMINAL HYSTERECTOMY     Ovaries Remain.   TUBAL LIGATION      Social History   Tobacco Use   Smoking status: Never   Smokeless tobacco: Never  Vaping Use   Vaping Use: Never used  Substance Use Topics   Alcohol use: Yes    Alcohol/week: 0.0 - 1.0 standard drinks of alcohol     Comment: on occassion   Drug use: Not Currently    Types: Marijuana     Medication list has been reviewed and updated.  Current Meds  Medication Sig   alendronate (FOSAMAX) 70 MG tablet Take 70 mg by mouth once a week.   atorvastatin (LIPITOR) 10 MG tablet TAKE (1) TABLET BY MOUTH EVERY DAY   calcium citrate-vitamin D (CITRACAL+D) 315-200 MG-UNIT tablet Take 2 tablets by mouth 2 (two) times daily.   cefdinir (OMNICEF) 300 MG capsule Take 1 capsule (300 mg total) by mouth 2 (two) times daily for 10 days.   escitalopram (LEXAPRO) 20 MG tablet Take 1.5 tablets (30 mg total) by mouth daily.   estradiol (ESTRACE) 0.1 MG/GM vaginal cream Place vaginally.   fluticasone (FLONASE) 50 MCG/ACT nasal spray Place 2 sprays into both nostrils daily.   hydrochlorothiazide (HYDRODIURIL) 25 MG tablet TAKE (1) TABLET BY MOUTH EVERY DAY   ibuprofen (ADVIL,MOTRIN) 600 MG tablet Take 1 tablet (600 mg total) by mouth every 6 (six) hours as needed.   levothyroxine (SYNTHROID) 175 MCG tablet Take 175 mcg by mouth daily.   lisinopril (ZESTRIL) 10 MG tablet TAKE (1) TABLET BY MOUTH EVERY DAY   metFORMIN (GLUCOPHAGE-XR) 500 MG 24 hr tablet Take 500 mg by mouth 2 (two) times daily. 2 in AM 2 in PM   metoprolol succinate (TOPROL-XL) 50 MG 24 hr tablet TAKE (1) TABLET BY MOUTH EVERY DAY   MYRBETRIQ 25 MG TB24 tablet    pantoprazole (PROTONIX) 40 MG tablet TAKE (1) TABLET BY MOUTH TWICE DAILY   pregabalin (LYRICA) 75 MG capsule Take by mouth daily. 6 tablets daily   promethazine-dextromethorphan (PROMETHAZINE-DM) 6.25-15 MG/5ML syrup Take 5 mLs by mouth 4 (four) times daily as needed.   tiZANidine (ZANAFLEX) 4 MG tablet TAKE (1) TABLET BY MOUTH DAILY AS NEEDEDFOR MUSCLE SPASMS.   valACYclovir (VALTREX) 1000 MG tablet Take 1,000 mg by mouth daily.   zaleplon (SONATA) 10 MG capsule TAKE (1) CAPSULE BY MOUTH EVERY NIGHT AT BEDTIME AS NEEDED FOR SLEEP   [DISCONTINUED] albuterol (VENTOLIN HFA) 108 (90 Base) MCG/ACT  inhaler Inhale 2 puffs into the lungs every 6 (six) hours as needed for wheezing or shortness of breath.   [DISCONTINUED] Fexofenadine HCl (MUCINEX ALLERGY PO) Take by mouth as needed.   [DISCONTINUED] folic acid (FOLVITE) 1 MG tablet Take 1 mg by mouth daily.   [DISCONTINUED] MOUNJARO 5 MG/0.5ML Pen Inject into the skin.       11/15/2021    2:45 PM 03/22/2021    2:43 PM 12/06/2020    1:46 PM 10/21/2020    8:53 AM  GAD 7 : Generalized Anxiety Score  Nervous, Anxious, on Edge 0 1 0 0  Control/stop worrying 0 1 0 0  Worry  too much - different things 0 0 0 0  Trouble relaxing 1 0 0 0  Restless 0 0 0 0  Easily annoyed or irritable 0 2 0 0  Afraid - awful might happen 0 0 0 0  Total GAD 7 Score 1 4 0 0  Anxiety Difficulty Not difficult at all  Not difficult at all Not difficult at all       11/15/2021    2:45 PM 03/22/2021    2:42 PM 12/06/2020    1:45 PM  Depression screen PHQ 2/9  Decreased Interest 2 2 0  Down, Depressed, Hopeless 0 0 0  PHQ - 2 Score 2 2 0  Altered sleeping 2 3 0  Tired, decreased energy 2 3   Change in appetite 0 3 0  Feeling bad or failure about yourself  0 1 0  Trouble concentrating 1 1 0  Moving slowly or fidgety/restless 0 0 0  Suicidal thoughts 0 0 0  PHQ-9 Score 7 13 0  Difficult doing work/chores Not difficult at all Very difficult Somewhat difficult    BP Readings from Last 3 Encounters:  03/13/22 112/62  11/15/21 128/76  03/22/21 122/78    Physical Exam Constitutional:      Appearance: Normal appearance.  Cardiovascular:     Rate and Rhythm: Normal rate and regular rhythm.     Pulses: Normal pulses.  Pulmonary:     Effort: Pulmonary effort is normal.     Breath sounds: Normal breath sounds. No wheezing or rhonchi.  Musculoskeletal:     Cervical back: Normal range of motion.     Right lower leg: No edema.     Left lower leg: No edema.  Lymphadenopathy:     Cervical: No cervical adenopathy.  Neurological:     Mental Status: She is  alert.  Psychiatric:        Mood and Affect: Mood normal.     Wt Readings from Last 3 Encounters:  03/13/22 203 lb (92.1 kg)  11/15/21 217 lb (98.4 kg)  03/22/21 245 lb (111.1 kg)    BP 112/62   Pulse (!) 105   Temp 98.7 F (37.1 C)   Ht 5' 6"$  (1.676 m)   Wt 203 lb (92.1 kg)   SpO2 97%   BMI 32.77 kg/m   Assessment and Plan: Problem List Items Addressed This Visit   None Visit Diagnoses     Upper respiratory tract infection, unspecified type    -  Primary   bacterail bronchitis following a non covid viral infection continue Mucinex DM and Rx cough syrup fluids, albuterol MDI PRN   Relevant Medications   cefdinir (OMNICEF) 300 MG capsule   albuterol (VENTOLIN HFA) 108 (90 Base) MCG/ACT inhaler        Partially dictated using Editor, commissioning. Any errors are unintentional.  Halina Maidens, MD [facility]  03/13/2022

## 2022-03-16 ENCOUNTER — Other Ambulatory Visit: Payer: Self-pay | Admitting: Internal Medicine

## 2022-03-16 ENCOUNTER — Encounter: Payer: Self-pay | Admitting: Internal Medicine

## 2022-03-16 DIAGNOSIS — R051 Acute cough: Secondary | ICD-10-CM

## 2022-03-16 MED ORDER — PROMETHAZINE-DM 6.25-15 MG/5ML PO SYRP: 5.0000 mL | ORAL_SOLUTION | Freq: Four times a day (QID) | ORAL | 0 refills | Status: DC | PRN

## 2022-03-20 ENCOUNTER — Other Ambulatory Visit: Payer: Self-pay | Admitting: Internal Medicine

## 2022-03-20 ENCOUNTER — Encounter: Payer: Self-pay | Admitting: Internal Medicine

## 2022-03-20 DIAGNOSIS — B3731 Acute candidiasis of vulva and vagina: Secondary | ICD-10-CM

## 2022-03-20 MED ORDER — FLUCONAZOLE 100 MG PO TABS: 100.0000 mg | ORAL_TABLET | Freq: Every day | ORAL | 0 refills | Status: AC

## 2022-03-22 ENCOUNTER — Encounter: Payer: Self-pay | Admitting: Internal Medicine

## 2022-03-23 ENCOUNTER — Encounter: Payer: Self-pay | Admitting: Internal Medicine

## 2022-03-23 ENCOUNTER — Other Ambulatory Visit: Payer: Self-pay | Admitting: Internal Medicine

## 2022-03-23 DIAGNOSIS — J069 Acute upper respiratory infection, unspecified: Secondary | ICD-10-CM

## 2022-03-23 DIAGNOSIS — R051 Acute cough: Secondary | ICD-10-CM

## 2022-03-23 MED ORDER — HYDROCODONE BIT-HOMATROP MBR 5-1.5 MG/5ML PO SOLN: 5.0000 mL | Freq: Four times a day (QID) | ORAL | 0 refills | Status: AC | PRN

## 2022-03-23 MED ORDER — DOXYCYCLINE HYCLATE 100 MG PO TABS: 100.0000 mg | ORAL_TABLET | Freq: Two times a day (BID) | ORAL | 0 refills | Status: AC

## 2022-04-03 ENCOUNTER — Telehealth: Payer: Self-pay

## 2022-04-03 NOTE — Transitions of Care (Post Inpatient/ED Visit) (Signed)
   04/03/2022  Name: Gabrielle Tucker MRN: [id number] DOB: 1960-02-11  Today's TOC FU Call Status:    Transition Care Management Follow-up Telephone Call Date of Discharge: 04/02/22 Discharge Facility: Other ([facility]) Name of Other ([facility]) Discharge Facility: [facility] Type of Discharge: Emergency Department Reason for ED Visit: Other: How have you been since you were released from the hospital?: Same Any questions or concerns?: No  Items Reviewed: Did you receive and understand the discharge instructions provided?: Yes Medications obtained and verified?: Yes (Medications Reviewed) Any new allergies since your discharge?: No Dietary orders reviewed?: No Do you have support at home?: Yes People in Home: spouse  Home Care and Equipment/Supplies: [place] Ordered?: No Any new equipment or medical supplies ordered?: No  Functional Questionnaire: Do you need assistance with bathing/showering or dressing?: No Do you need assistance with meal preparation?: No Do you need assistance with eating?: No Do you have difficulty maintaining continence: No Do you need assistance with getting out of bed/getting out of a chair/moving?: Yes Do you have difficulty managing or taking your medications?: No  Folllow up appointments reviewed: PCP Follow-up appointment confirmed?: No MD Provider Line [phone] Given: Yes [facility] Follow-up appointment confirmed?: Yes Date of Specialist follow-up appointment?: 04/04/22 Follow-Up Specialty Provider:: Neurology Do you need transportation to your follow-up appointment?: No Do you understand care options if your condition(s) worsen?: Yes-patient verbalized understanding    [facility] [place], [address]  [place] [address] Office [phone]  Fax: [phone]

## 2022-04-04 ENCOUNTER — Other Ambulatory Visit: Payer: Self-pay | Admitting: Internal Medicine

## 2022-04-04 DIAGNOSIS — Z8711 Personal history of peptic ulcer disease: Secondary | ICD-10-CM

## 2022-04-08 ENCOUNTER — Other Ambulatory Visit: Payer: Self-pay | Admitting: Internal Medicine

## 2022-04-08 DIAGNOSIS — I10 Essential (primary) hypertension: Secondary | ICD-10-CM

## 2022-04-09 NOTE — Telephone Encounter (Signed)
Requested Prescriptions  Pending Prescriptions Disp Refills   hydrochlorothiazide (HYDRODIURIL) 25 MG tablet [Pharmacy Med Name: HYDROCHLOROTHIAZIDE 25 MG TAB] 150 tablet 0    Sig: TAKE 1 TABLET BY MOUTH EVERY DAY     Cardiovascular: Diuretics - Thiazide Passed - 04/08/2022  1:13 AM      Passed - Cr in normal range and within 180 days    Creatinine, Ser  Date Value Ref Range Status  11/15/2021 0.99 0.57 - 1.00 mg/dL Final   Creatinine, Urine  Date Value Ref Range Status  02/27/2021 123  Final         Passed - K in normal range and within 180 days    Potassium  Date Value Ref Range Status  11/15/2021 4.0 3.5 - 5.2 mmol/L Final         Passed - Na in normal range and within 180 days    Sodium  Date Value Ref Range Status  11/15/2021 142 134 - 144 mmol/L Final         Passed - Last BP in normal range    BP Readings from Last 1 Encounters:  03/13/22 112/62         Passed - Valid encounter within last 6 months    Recent Outpatient Visits           3 weeks ago Upper respiratory tract infection, unspecified type   [place] [facility] [place] [facility], Jesse Sans, MD   4 months ago Essential hypertension   [place] [facility], Jesse Sans, MD   1 year ago Mood disorder [facility])   [facility], Jesse Sans, MD   1 year ago Acute non-recurrent maxillary sinusitis   [place] [facility] [place] [facility], Jesse Sans, MD   1 year ago Breast pain, left   [facility] [place] [facility], Jesse Sans, MD       Future Appointments             In 2 months [facility] Melia, Jesse Sans, MD [place] [facility], [facility]

## 2022-04-23 ENCOUNTER — Other Ambulatory Visit: Payer: Self-pay

## 2022-04-23 ENCOUNTER — Encounter: Payer: Self-pay | Admitting: Internal Medicine

## 2022-04-23 MED ORDER — FLUCONAZOLE 150 MG PO TABS: 150.0000 mg | ORAL_TABLET | Freq: Once | ORAL | 0 refills | Status: DC

## 2022-04-30 ENCOUNTER — Encounter: Payer: Self-pay | Admitting: Internal Medicine

## 2022-04-30 ENCOUNTER — Ambulatory Visit: Payer: BLUE CROSS/BLUE SHIELD | Admitting: Internal Medicine

## 2022-04-30 VITALS — BP 102/60 | HR 74 | Temp 98.2°F | Ht 66.0 in | Wt 212.0 lb

## 2022-04-30 DIAGNOSIS — J3089 Other allergic rhinitis: Secondary | ICD-10-CM | POA: Diagnosis not present

## 2022-04-30 NOTE — Progress Notes (Signed)
Date:  04/30/2022   Name:  Georgia DuffMary Beth Reichenbach   DOB:  12/30/1960   MRN:  [id number]   Chief Complaint: Cough (Started 6 days ago with eye burning and then eyes came shortly after. )  Cough This is a new problem. Episode onset: 6 days. The problem has been waxing and waning. The cough is Productive of sputum. Associated symptoms include postnasal drip and a sore throat. Pertinent negatives include no chest pain, chills, ear pain, fever, headaches, myalgias, nasal congestion, shortness of breath or wheezing.    Lab Results  Component Value Date   NA 142 11/15/2021   K 4.0 11/15/2021   CO2 23 11/15/2021   GLUCOSE 86 11/15/2021   BUN 6 (L) 11/15/2021   CREATININE 0.99 11/15/2021   CALCIUM 9.2 11/15/2021   EGFR 65 11/15/2021   GFRNONAA 92 03/18/2019   Lab Results  Component Value Date   CHOL 154 11/15/2021   HDL 45 11/15/2021   LDLCALC 82 11/15/2021   TRIG 158 (H) 11/15/2021   CHOLHDL 3.4 11/15/2021   Lab Results  Component Value Date   TSH 0.02 (A) 09/26/2021   Lab Results  Component Value Date   HGBA1C 5.1 02/13/2022   Lab Results  Component Value Date   WBC 11.5 04/27/2021   HGB 12.7 04/27/2021   HCT 37 04/27/2021   MCV 92 01/07/2019   PLT 221 04/27/2021   Lab Results  Component Value Date   ALT 17 11/15/2021   AST 25 11/15/2021   ALKPHOS 98 11/15/2021   BILITOT 0.6 11/15/2021   No results found for: "25OHVITD2", "25OHVITD3", "VD25OH"   Review of Systems  Constitutional:  Negative for chills, fatigue and fever.  HENT:  Positive for postnasal drip and sore throat. Negative for ear pain, sinus pressure and tinnitus.   Eyes:  Positive for itching.  Respiratory:  Positive for cough. Negative for chest tightness, shortness of breath and wheezing.   Cardiovascular:  Negative for chest pain.  Musculoskeletal:  Negative for myalgias.  Neurological:  Negative for headaches.  Psychiatric/Behavioral:  Positive for sleep disturbance.     Patient Active Problem  List   Diagnosis Date Noted   Low bone mass 02/13/2022   Myeloradiculopathy 07/19/2021   Post-surgical hypothyroidism 03/22/2021   Osteopenia of hip 09/05/2020   On corticosteroid therapy 08/05/2020   Status post hysterectomy 03/01/2020   Type II diabetes mellitus with complication 01/07/2019   Mood disorder 01/07/2019   S/P total thyroidectomy 12/16/2017   Multiple thyroid nodules 11/12/2017   Hyperlipidemia, mild 08/03/2016   Arthritis of knee, left 08/02/2016   Back muscle spasm 07/03/2014   H/O peptic ulcer 07/03/2014   Migraine without aura and responsive to treatment 07/03/2014   Muscle contraction headache 07/03/2014   Idiopathic insomnia 07/03/2014   Tachycardia 07/03/2014    Allergies  Allergen Reactions   Cefdinir Diarrhea   Morphine And Related Nausea And Vomiting   Egg-Derived Products    Tape Hives and Rash    No [paper tape.  Tega derm OK    Past Surgical History:  Procedure Laterality Date   ANTERIOR FUSION CERVICAL SPINE  2006   BREAST REDUCTION SURGERY     CHOLECYSTECTOMY  05/05/2019   COLONOSCOPY WITH PROPOFOL N/A 08/24/2020   Procedure: COLONOSCOPY WITH PROPOFOL;  Surgeon: Toledo, Boykin Nearingeodoro K, MD;  Location: [facility];  Service: Gastroenterology;  Laterality: N/A;  DM   ESOPHAGOGASTRODUODENOSCOPY  2011   gastric ulcer   ESOPHAGOGASTRODUODENOSCOPY (EGD) WITH PROPOFOL N/A 08/24/2020  Procedure: ESOPHAGOGASTRODUODENOSCOPY (EGD) WITH PROPOFOL;  Surgeon: Toledo, Boykin Nearing, MD;  Location: [facility];  Service: Gastroenterology;  Laterality: N/A;   LITHOTRIPSY  2012   with setnet   LUMBAR DISC SURGERY     NASAL SINUS SURGERY  2015   partail knee replacement Bilateral    REDUCTION MAMMAPLASTY Bilateral 2011   REPAIR TENDONS FOOT Left    THYROIDECTOMY Bilateral 12/16/2017   Procedure: THYROIDECTOMY;  Surgeon: Vernie Murders, MD;  Location: [facility] ORS;  Service: ENT;  Laterality: Bilateral;   TONSILLECTOMY     TOTAL ABDOMINAL HYSTERECTOMY     Ovaries  Remain.   TUBAL LIGATION      Social History   Tobacco Use   Smoking status: Never   Smokeless tobacco: Never  Vaping Use   Vaping Use: Never used  Substance Use Topics   Alcohol use: Yes    Alcohol/week: 0.0 - 1.0 standard drinks of alcohol    Comment: on occassion   Drug use: Not Currently    Types: Marijuana     Medication list has been reviewed and updated.  Current Meds  Medication Sig   albuterol (VENTOLIN HFA) 108 (90 Base) MCG/ACT inhaler Inhale 2 puffs into the lungs every 6 (six) hours as needed for wheezing or shortness of breath.   alendronate (FOSAMAX) 70 MG tablet Take 70 mg by mouth once a week.   atorvastatin (LIPITOR) 10 MG tablet TAKE (1) TABLET BY MOUTH EVERY DAY   calcium citrate-vitamin D (CITRACAL+D) 315-200 MG-UNIT tablet Take 2 tablets by mouth 2 (two) times daily.   carbamazepine (CARBATROL) 100 MG 12 hr capsule Take 100 mg by mouth 2 (two) times daily.   escitalopram (LEXAPRO) 20 MG tablet Take 1.5 tablets (30 mg total) by mouth daily.   estradiol (ESTRACE) 0.1 MG/GM vaginal cream Place vaginally.   fluticasone (FLONASE) 50 MCG/ACT nasal spray Place 2 sprays into both nostrils daily.   hydrochlorothiazide (HYDRODIURIL) 25 MG tablet TAKE 1 TABLET BY MOUTH EVERY DAY   ibuprofen (ADVIL,MOTRIN) 600 MG tablet Take 1 tablet (600 mg total) by mouth every 6 (six) hours as needed.   levothyroxine (SYNTHROID) 175 MCG tablet Take 175 mcg by mouth daily.   lisinopril (ZESTRIL) 10 MG tablet TAKE (1) TABLET BY MOUTH EVERY DAY   metFORMIN (GLUCOPHAGE-XR) 500 MG 24 hr tablet Take 500 mg by mouth 2 (two) times daily. 2 in AM 2 in PM   metoprolol succinate (TOPROL-XL) 50 MG 24 hr tablet TAKE (1) TABLET BY MOUTH EVERY DAY   MYRBETRIQ 25 MG TB24 tablet    pantoprazole (PROTONIX) 40 MG tablet TAKE 1 TABLET BY MOUTH TWICE A DAY   pregabalin (LYRICA) 75 MG capsule Take by mouth daily. 6 tablets daily   tiZANidine (ZANAFLEX) 4 MG tablet TAKE (1) TABLET BY MOUTH DAILY AS  NEEDEDFOR MUSCLE SPASMS.   valACYclovir (VALTREX) 1000 MG tablet Take 1,000 mg by mouth daily.   zaleplon (SONATA) 10 MG capsule TAKE (1) CAPSULE BY MOUTH EVERY NIGHT AT BEDTIME AS NEEDED FOR SLEEP       04/30/2022   10:27 AM 11/15/2021    2:45 PM 03/22/2021    2:43 PM 12/06/2020    1:46 PM  GAD 7 : Generalized Anxiety Score  Nervous, Anxious, on Edge 0 0 1 0  Control/stop worrying 0 0 1 0  Worry too much - different things 0 0 0 0  Trouble relaxing 0 1 0 0  Restless 0 0 0 0  Easily annoyed or irritable 0  0 2 0  Afraid - awful might happen 0 0 0 0  Total GAD 7 Score 0 1 4 0  Anxiety Difficulty Not difficult at all Not difficult at all  Not difficult at all       04/30/2022   10:27 AM 11/15/2021    2:45 PM 03/22/2021    2:42 PM  Depression screen PHQ 2/9  Decreased Interest 0 2 2  Down, Depressed, Hopeless 0 0 0  PHQ - 2 Score 0 2 2  Altered sleeping 0 2 3  Tired, decreased energy 0 2 3  Change in appetite 0 0 3  Feeling bad or failure about yourself  0 0 1  Trouble concentrating 0 1 1  Moving slowly or fidgety/restless 0 0 0  Suicidal thoughts 0 0 0  PHQ-9 Score 0 7 13  Difficult doing work/chores Not difficult at all Not difficult at all Very difficult    BP Readings from Last 3 Encounters:  04/30/22 102/60  03/13/22 112/62  11/15/21 128/76    Physical Exam Vitals and nursing note reviewed.  Constitutional:      General: She is not in acute distress.    Appearance: Normal appearance. She is well-developed.  HENT:     Head: Normocephalic and atraumatic.     Right Ear: Tympanic membrane normal.     Left Ear: Tympanic membrane normal.     Nose: Nose normal.     Right Sinus: No maxillary sinus tenderness or frontal sinus tenderness.     Left Sinus: No maxillary sinus tenderness or frontal sinus tenderness.     Mouth/Throat:     Pharynx: No oropharyngeal exudate or posterior oropharyngeal erythema.  Eyes:     Extraocular Movements: Extraocular movements intact.      Pupils: Pupils are equal, round, and reactive to light.  Cardiovascular:     Rate and Rhythm: Normal rate and regular rhythm.  Pulmonary:     Effort: Pulmonary effort is normal. No respiratory distress.     Breath sounds: No wheezing or rhonchi.  Musculoskeletal:     Cervical back: Normal range of motion.  Lymphadenopathy:     Cervical: No cervical adenopathy.  Skin:    General: Skin is warm and dry.     Findings: No rash.  Neurological:     Mental Status: She is alert and oriented to person, place, and time.  Psychiatric:        Mood and Affect: Mood normal.        Behavior: Behavior normal.     Wt Readings from Last 3 Encounters:  04/30/22 212 lb (96.2 kg)  03/13/22 203 lb (92.1 kg)  11/15/21 217 lb (98.4 kg)    BP 102/60   Pulse 74   Temp 98.2 F (36.8 C) (Oral)   Ht 5\' 6"  (1.676 m)   Wt 212 lb (96.2 kg)   SpO2 96%   BMI 34.22 kg/m   Assessment and Plan:  Problem List Items Addressed This Visit   None Visit Diagnoses     Environmental and seasonal allergies    -  Primary   No evidence of bacterial infection Rec:  claritin 10 mg qAM and Benadryl 25 mg qHS call or return if symptoms worsen       No follow-ups on file.   Partially dictated using Dragon software, any errors are not intentional.  Reubin Milan, MD [facility] [place], [place]

## 2022-04-30 NOTE — Patient Instructions (Signed)
Claritin 10 mg every morning  Benadryl 25 mg at bedtime  Nyquil or other dextromethorphan containing product

## 2022-05-03 ENCOUNTER — Other Ambulatory Visit: Payer: Self-pay | Admitting: Internal Medicine

## 2022-05-03 DIAGNOSIS — R051 Acute cough: Secondary | ICD-10-CM

## 2022-05-03 MED ORDER — AZITHROMYCIN 250 MG PO TABS: ORAL_TABLET | ORAL | 0 refills | Status: AC

## 2022-05-03 MED ORDER — PROMETHAZINE-DM 6.25-15 MG/5ML PO SYRP
5.0000 mL | ORAL_SOLUTION | Freq: Four times a day (QID) | ORAL | 0 refills | Status: DC | PRN
Start: 2022-05-03 — End: 2022-06-08

## 2022-05-20 ENCOUNTER — Other Ambulatory Visit: Payer: Self-pay | Admitting: Internal Medicine

## 2022-05-20 DIAGNOSIS — I10 Essential (primary) hypertension: Secondary | ICD-10-CM

## 2022-05-20 LAB — COMPREHENSIVE METABOLIC PANEL: eGFR: 34

## 2022-05-20 LAB — BASIC METABOLIC PANEL: Creatinine: 1.7 — AB (ref 0.5–1.1)

## 2022-05-21 ENCOUNTER — Telehealth: Payer: Self-pay | Admitting: Internal Medicine

## 2022-05-21 ENCOUNTER — Encounter: Payer: Self-pay | Admitting: Internal Medicine

## 2022-05-21 ENCOUNTER — Other Ambulatory Visit: Payer: Self-pay

## 2022-05-21 ENCOUNTER — Ambulatory Visit: Payer: Self-pay | Admitting: *Deleted

## 2022-05-21 DIAGNOSIS — N1832 Chronic kidney disease, stage 3b: Secondary | ICD-10-CM

## 2022-05-21 NOTE — Telephone Encounter (Signed)
Copied from CRM [id number]. Topic: General - Inquiry >> May 21, 2022  2:01 PM Marlow Baars wrote: Reason for CRM: The patient called back in returning Chassidy's phone call concerning lab results. Please assist patient further

## 2022-05-21 NOTE — Telephone Encounter (Signed)
Please review.  KP

## 2022-05-21 NOTE — Telephone Encounter (Signed)
Called patient and left VM asking her to call back about lab results.  - Kennetta Pavlovic

## 2022-05-21 NOTE — Telephone Encounter (Signed)
Spoke with patient- referral placed to nephrology and patient will see [place] on May 20th for labs.

## 2022-05-21 NOTE — Telephone Encounter (Signed)
Maggie from [facility]rology called to report lab results. EGRF from 04/16/22 52 and yesterday reports 34. Fax # given to fax results to practice. Please advise.

## 2022-05-21 NOTE — Telephone Encounter (Signed)
Spoke with patient- referral placed to nephrology and patient will see us on May 20th for labs. 

## 2022-05-22 ENCOUNTER — Encounter: Payer: Self-pay | Admitting: Internal Medicine

## 2022-05-29 LAB — MICROALBUMIN, URINE: Microalb, Ur: 6.1

## 2022-05-29 LAB — BASIC METABOLIC PANEL
BUN: 12 (ref 4–21)
CO2: 26 — AB (ref 13–22)
Chloride: 105 (ref 99–108)
Creatinine: 1.2 — AB (ref 0.5–1.1)
Glucose: 84
Potassium: 4.1 mEq/L (ref 3.5–5.1)
Sodium: 139 (ref 137–147)

## 2022-05-29 LAB — COMPREHENSIVE METABOLIC PANEL: eGFR: 52

## 2022-05-29 LAB — MICROALBUMIN / CREATININE URINE RATIO: Microalb Creat Ratio: 23

## 2022-05-29 LAB — PROTEIN / CREATININE RATIO, URINE
Albumin, U: 19
Creatinine, Urine: 264

## 2022-05-31 ENCOUNTER — Other Ambulatory Visit: Payer: Self-pay | Admitting: Internal Medicine

## 2022-05-31 DIAGNOSIS — F5101 Primary insomnia: Secondary | ICD-10-CM

## 2022-05-31 NOTE — Telephone Encounter (Signed)
Requested medication (s) are due for refill today: yes  Requested medication (s) are on the active medication list: yes  Last refill:  02/19/22 #30 2 RF  Future visit scheduled: yes  Notes to clinic:  med not delegated to NT to RF   Requested Prescriptions  Pending Prescriptions Disp Refills   zaleplon (SONATA) 10 MG capsule [Pharmacy Med Name: ZALEPLON 10 MG CAPSULE] 30 capsule 2    Sig: TAKE (1) CAPSULE BY MOUTH EVERY NIGHT AT BEDTIME AS NEEDED FOR SLEEP     Not Delegated - Psychiatry:  Anxiolytics/Hypnotics Failed - 05/31/2022 12:45 PM      Failed - This refill cannot be delegated      Failed - Urine Drug Screen completed in last 360 days      Passed - Valid encounter within last 6 months    Recent Outpatient Visits           1 month ago Environmental and seasonal allergies   [place] [facility] at [facility], Nyoka Cowden, MD   2 months ago Upper respiratory tract infection, unspecified type   [facility] at [facility], Nyoka Cowden, MD   6 months ago Essential hypertension   [place] [facility] at [facility], Nyoka Cowden, MD   1 year ago Mood disorder [facility])   [place] [facility] at [facility], Nyoka Cowden, MD   1 year ago Acute non-recurrent maxillary sinusitis   [place] [facility] at [facility], Nyoka Cowden, MD       Future Appointments             In 1 week Judithann Graves, Nyoka Cowden, MD [facility] Care [facility] Sports Medicine at [facility], [facility]

## 2022-05-31 NOTE — Telephone Encounter (Signed)
Please review.  KP

## 2022-06-08 ENCOUNTER — Ambulatory Visit: Payer: BLUE CROSS/BLUE SHIELD | Admitting: Internal Medicine

## 2022-06-08 ENCOUNTER — Encounter: Payer: Self-pay | Admitting: Internal Medicine

## 2022-06-08 VITALS — BP 122/70 | HR 88 | Temp 98.2°F | Ht 66.0 in | Wt 208.0 lb

## 2022-06-08 DIAGNOSIS — J42 Unspecified chronic bronchitis: Secondary | ICD-10-CM | POA: Diagnosis not present

## 2022-06-08 DIAGNOSIS — B9689 Other specified bacterial agents as the cause of diseases classified elsewhere: Secondary | ICD-10-CM | POA: Diagnosis not present

## 2022-06-08 DIAGNOSIS — Z7984 Long term (current) use of oral hypoglycemic drugs: Secondary | ICD-10-CM

## 2022-06-08 DIAGNOSIS — R051 Acute cough: Secondary | ICD-10-CM | POA: Diagnosis not present

## 2022-06-08 DIAGNOSIS — E119 Type 2 diabetes mellitus without complications: Secondary | ICD-10-CM | POA: Diagnosis not present

## 2022-06-08 DIAGNOSIS — Z79899 Other long term (current) drug therapy: Secondary | ICD-10-CM

## 2022-06-08 MED ORDER — PROMETHAZINE-DM 6.25-15 MG/5ML PO SYRP
5.0000 mL | ORAL_SOLUTION | Freq: Four times a day (QID) | ORAL | 0 refills | Status: DC | PRN
Start: 2022-06-08 — End: 2023-03-01

## 2022-06-08 MED ORDER — DOXYCYCLINE HYCLATE 100 MG PO TABS
100.0000 mg | ORAL_TABLET | Freq: Two times a day (BID) | ORAL | 0 refills | Status: AC
Start: 2022-06-08 — End: 2022-06-18

## 2022-06-08 NOTE — Progress Notes (Signed)
Date:  06/08/2022   Name:  Kadynn Schantz   DOB:  Jun 03, 1960   MRN:  [id number]   Chief Complaint: Cough (Started Last Friday- X 1 week ago. Fatigue, slight production- yellow production. Fever last weekend. Covid - Negative at home. )  Cough This is a new problem. The current episode started in the past 7 days. The problem has been unchanged. The problem occurs every few minutes. The cough is Productive of sputum. Pertinent negatives include no chest pain, chills, ear pain, fever, postnasal drip, shortness of breath or wheezing. She has tried OTC cough suppressant for the symptoms. The treatment provided mild relief.    Lab Results  Component Value Date   NA 139 05/29/2022   K 4.1 05/29/2022   CO2 26 (A) 05/29/2022   GLUCOSE 86 11/15/2021   BUN 12 05/29/2022   CREATININE 1.2 (A) 05/29/2022   CALCIUM 9.2 11/15/2021   EGFR 52 05/29/2022   GFRNONAA 92 03/18/2019   Lab Results  Component Value Date   CHOL 154 11/15/2021   HDL 45 11/15/2021   LDLCALC 82 11/15/2021   TRIG 158 (H) 11/15/2021   CHOLHDL 3.4 11/15/2021   Lab Results  Component Value Date   TSH 0.02 (A) 09/26/2021   Lab Results  Component Value Date   HGBA1C 5.1 02/13/2022   Lab Results  Component Value Date   WBC 11.5 04/27/2021   HGB 12.7 04/27/2021   HCT 37 04/27/2021   MCV 92 01/07/2019   PLT 221 04/27/2021   Lab Results  Component Value Date   ALT 17 11/15/2021   AST 25 11/15/2021   ALKPHOS 98 11/15/2021   BILITOT 0.6 11/15/2021   No results found for: "25OHVITD2", "25OHVITD3", "VD25OH"   Review of Systems  Constitutional:  Negative for chills and fever.  HENT:  Negative for ear pain and postnasal drip.   Respiratory:  Positive for cough. Negative for shortness of breath and wheezing.   Cardiovascular:  Negative for chest pain and palpitations.  Psychiatric/Behavioral:  Negative for dysphoric mood and sleep disturbance. The patient is not nervous/anxious.     Patient Active Problem  List   Diagnosis Date Noted   Diabetes mellitus treated with injections of non-insulin medication (HCC) 06/08/2022   Low bone mass 02/13/2022   Myeloradiculopathy 07/19/2021   Post-surgical hypothyroidism 03/22/2021   Osteopenia of hip 09/05/2020   On corticosteroid therapy 08/05/2020   Status post hysterectomy 03/01/2020   Diabetes mellitus treated with oral medication (HCC) 01/07/2019   Mood disorder (HCC) 01/07/2019   S/P total thyroidectomy 12/16/2017   Multiple thyroid nodules 11/12/2017   Hyperlipidemia, mild 08/03/2016   Arthritis of knee, left 08/02/2016   Back muscle spasm 07/03/2014   H/O peptic ulcer 07/03/2014   Migraine without aura and responsive to treatment 07/03/2014   Muscle contraction headache 07/03/2014   Idiopathic insomnia 07/03/2014   Tachycardia 07/03/2014    Allergies  Allergen Reactions   Cefdinir Diarrhea   Morphine And Codeine Nausea And Vomiting   Egg-Derived Products    Tape Hives and Rash    No [paper tape.  Tega derm OK    Past Surgical History:  Procedure Laterality Date   ANTERIOR FUSION CERVICAL SPINE  2006   BREAST REDUCTION SURGERY     CHOLECYSTECTOMY  05/05/2019   COLONOSCOPY WITH PROPOFOL N/A 08/24/2020   Procedure: COLONOSCOPY WITH PROPOFOL;  Surgeon: Toledo, Boykin Nearing, MD;  Location: [facility] ENDOSCOPY;  Service: Gastroenterology;  Laterality: N/A;  DM  ESOPHAGOGASTRODUODENOSCOPY  2011   gastric ulcer   ESOPHAGOGASTRODUODENOSCOPY (EGD) WITH PROPOFOL N/A 08/24/2020   Procedure: ESOPHAGOGASTRODUODENOSCOPY (EGD) WITH PROPOFOL;  Surgeon: Toledo, Boykin Nearing, MD;  Location: [facility] ENDOSCOPY;  Service: Gastroenterology;  Laterality: N/A;   LITHOTRIPSY  2012   with setnet   LUMBAR DISC SURGERY     NASAL SINUS SURGERY  2015   partail knee replacement Bilateral    REDUCTION MAMMAPLASTY Bilateral 2011   REPAIR TENDONS FOOT Left    THYROIDECTOMY Bilateral 12/16/2017   Procedure: THYROIDECTOMY;  Surgeon: Vernie Murders, MD;  Location: [facility] ORS;   Service: ENT;  Laterality: Bilateral;   TONSILLECTOMY     TOTAL ABDOMINAL HYSTERECTOMY     Ovaries Remain.   TUBAL LIGATION      Social History   Tobacco Use   Smoking status: Never   Smokeless tobacco: Never  Vaping Use   Vaping Use: Never used  Substance Use Topics   Alcohol use: Yes    Alcohol/week: 0.0 - 1.0 standard drinks of alcohol    Comment: on occassion   Drug use: Not Currently    Types: Marijuana     Medication list has been reviewed and updated.  Current Meds  Medication Sig   albuterol (VENTOLIN HFA) 108 (90 Base) MCG/ACT inhaler Inhale 2 puffs into the lungs every 6 (six) hours as needed for wheezing or shortness of breath.   alendronate (FOSAMAX) 70 MG tablet Take 70 mg by mouth once a week.   atorvastatin (LIPITOR) 10 MG tablet TAKE (1) TABLET BY MOUTH EVERY DAY   calcium citrate-vitamin D (CITRACAL+D) 315-200 MG-UNIT tablet Take 2 tablets by mouth 2 (two) times daily.   carbamazepine (CARBATROL) 100 MG 12 hr capsule Take 100 mg by mouth 2 (two) times daily.   doxycycline (VIBRA-TABS) 100 MG tablet Take 1 tablet (100 mg total) by mouth 2 (two) times daily for 10 days.   escitalopram (LEXAPRO) 20 MG tablet Take 1.5 tablets (30 mg total) by mouth daily.   estradiol (ESTRACE) 0.1 MG/GM vaginal cream Place vaginally.   fluticasone (FLONASE) 50 MCG/ACT nasal spray Place 2 sprays into both nostrils daily.   ibuprofen (ADVIL,MOTRIN) 600 MG tablet Take 1 tablet (600 mg total) by mouth every 6 (six) hours as needed.   levothyroxine (SYNTHROID) 175 MCG tablet Take 175 mcg by mouth daily.   metFORMIN (GLUCOPHAGE-XR) 500 MG 24 hr tablet Take 500 mg by mouth 2 (two) times daily. 2 in AM 2 in PM   MYRBETRIQ 25 MG TB24 tablet    pantoprazole (PROTONIX) 40 MG tablet TAKE 1 TABLET BY MOUTH TWICE A DAY   pregabalin (LYRICA) 75 MG capsule Take by mouth daily. 6 tablets daily   tirzepatide [facility]) 2.5 MG/0.5ML Pen Inject 2.5 mg into the skin once a week.   tiZANidine  (ZANAFLEX) 4 MG tablet TAKE (1) TABLET BY MOUTH DAILY AS NEEDEDFOR MUSCLE SPASMS.   valACYclovir (VALTREX) 1000 MG tablet Take 1,000 mg by mouth daily.   zaleplon (SONATA) 10 MG capsule TAKE (1) CAPSULE BY MOUTH EVERY NIGHT AT BEDTIME AS NEEDED FOR SLEEP   [DISCONTINUED] hydrochlorothiazide (HYDRODIURIL) 25 MG tablet TAKE 1 TABLET BY MOUTH EVERY DAY   [DISCONTINUED] lisinopril (ZESTRIL) 10 MG tablet TAKE 1 TABLET BY MOUTH EVERY DAY   [DISCONTINUED] metoprolol succinate (TOPROL-XL) 50 MG 24 hr tablet TAKE (1) TABLET BY MOUTH EVERY DAY       06/08/2022    1:49 PM 04/30/2022   10:27 AM 11/15/2021    2:45 PM 03/22/2021  2:43 PM  GAD 7 : Generalized Anxiety Score  Nervous, Anxious, on Edge 0 0 0 1  Control/stop worrying 0 0 0 1  Worry too much - different things 0 0 0 0  Trouble relaxing 0 0 1 0  Restless 0 0 0 0  Easily annoyed or irritable 0 0 0 2  Afraid - awful might happen 0 0 0 0  Total GAD 7 Score 0 0 1 4  Anxiety Difficulty Not difficult at all Not difficult at all Not difficult at all        06/08/2022    1:49 PM 04/30/2022   10:27 AM 11/15/2021    2:45 PM  Depression screen PHQ 2/9  Decreased Interest 0 0 2  Down, Depressed, Hopeless 0 0 0  PHQ - 2 Score 0 0 2  Altered sleeping 0 0 2  Tired, decreased energy 2 0 2  Change in appetite 1 0 0  Feeling bad or failure about yourself  0 0 0  Trouble concentrating 1 0 1  Moving slowly or fidgety/restless 0 0 0  Suicidal thoughts 0 0 0  PHQ-9 Score 4 0 7  Difficult doing work/chores Not difficult at all Not difficult at all Not difficult at all    BP Readings from Last 3 Encounters:  06/08/22 122/70  04/30/22 102/60  03/13/22 112/62    Physical Exam Constitutional:      Appearance: She is ill-appearing.  Cardiovascular:     Rate and Rhythm: Normal rate and regular rhythm.  Pulmonary:     Effort: Pulmonary effort is normal.     Breath sounds: Transmitted upper airway sounds present. No wheezing or rhonchi.   Musculoskeletal:     Cervical back: Normal range of motion.  Lymphadenopathy:     Cervical: No cervical adenopathy.  Neurological:     Mental Status: She is alert.     Wt Readings from Last 3 Encounters:  06/08/22 208 lb (94.3 kg)  04/30/22 212 lb (96.2 kg)  03/13/22 203 lb (92.1 kg)    BP 122/70   Pulse 88   Temp 98.2 F (36.8 C) (Oral)   Ht 5\' 6"  (1.676 m)   Wt 208 lb (94.3 kg)   SpO2 97%   BMI 33.57 kg/m   Assessment and Plan:  Problem List Items Addressed This Visit     Diabetes mellitus treated with oral medication (HCC)   Relevant Medications   tirzepatide (MOUNJARO) 2.5 MG/0.5ML Pen   Diabetes mellitus treated with injections of non-insulin medication (HCC)   Relevant Medications   tirzepatide (MOUNJARO) 2.5 MG/0.5ML Pen   Other Visit Diagnoses     Protracted bacterial bronchitis (HCC)    -  Primary   continue Robitussin, fluids and rest since symptoms continue despite   Relevant Medications   doxycycline (VIBRA-TABS) 100 MG tablet   promethazine-dextromethorphan (PROMETHAZINE-DM) 6.25-15 MG/5ML syrup   Acute cough           No follow-ups on file.   Partially dictated using Dragon software, any errors are not intentional.  Reubin Milan, MD [facility] Primary Care and Sports Medicine [place], [place]

## 2022-06-11 ENCOUNTER — Ambulatory Visit: Payer: BLUE CROSS/BLUE SHIELD | Admitting: Internal Medicine

## 2022-06-17 ENCOUNTER — Other Ambulatory Visit: Payer: Self-pay | Admitting: Internal Medicine

## 2022-06-19 NOTE — Telephone Encounter (Signed)
No longer listed on current medication list Requested Prescriptions  Pending Prescriptions Disp Refills   metoprolol succinate (TOPROL-XL) 50 MG 24 hr tablet [Pharmacy Med Name: METOPROLOL SUCC ER 50 MG TAB] 90 tablet 1    Sig: TAKE 1 TABLET BY MOUTH EVERY DAY     Cardiovascular:  Beta Blockers Passed - 06/17/2022  1:29 AM      Passed - Last BP in normal range    BP Readings from Last 1 Encounters:  06/08/22 122/70         Passed - Last Heart Rate in normal range    Pulse Readings from Last 1 Encounters:  06/08/22 88         Passed - Valid encounter within last 6 months    Recent Outpatient Visits           1 week ago Protracted bacterial bronchitis (HCC)   [place] [facility] at [facility], Nyoka Cowden, MD   1 month ago Environmental and seasonal allergies   [place] [facility] at [facility], Nyoka Cowden, MD   3 months ago Upper respiratory tract infection, unspecified type   [facility] Medicine at [facility], Nyoka Cowden, MD   7 months ago Essential hypertension   [place] [facility] Care [facility] Medicine at [facility], Nyoka Cowden, MD   1 year ago Mood disorder [facility])   [facility] Primary Care & Sports Medicine at [facility], Nyoka Cowden, MD

## 2022-06-25 ENCOUNTER — Encounter: Payer: Self-pay | Admitting: Internal Medicine

## 2022-06-26 ENCOUNTER — Encounter: Payer: Self-pay | Admitting: Internal Medicine

## 2022-06-26 NOTE — Telephone Encounter (Signed)
Please review.  KP

## 2022-06-29 ENCOUNTER — Other Ambulatory Visit: Payer: Self-pay | Admitting: Internal Medicine

## 2022-06-29 DIAGNOSIS — F39 Unspecified mood [affective] disorder: Secondary | ICD-10-CM

## 2022-06-29 NOTE — Telephone Encounter (Signed)
Requested Prescriptions  Pending Prescriptions Disp Refills   escitalopram (LEXAPRO) 20 MG tablet [Pharmacy Med Name: ESCITALOPRAM 20 MG TABLET] 135 tablet 0    Sig: TAKE 1.5 TABS BY MOUTH DAILY     Psychiatry:  Antidepressants - SSRI Passed - 06/29/2022  2:38 AM      Passed - Valid encounter within last 6 months    Recent Outpatient Visits           3 weeks ago Protracted bacterial bronchitis (HCC)   [place] [facility] at [facility], Nyoka Cowden, MD   2 months ago Environmental and seasonal allergies   [place] [facility] Medicine at [facility], Nyoka Cowden, MD   3 months ago Upper respiratory tract infection, unspecified type   [facility] Primary Care [facility] Medicine at [facility], Nyoka Cowden, MD   7 months ago Essential hypertension   [place] [facility] Medicine at [facility], Nyoka Cowden, MD   1 year ago Mood disorder [facility])   [facility] Primary Care & Sports Medicine at [facility], Nyoka Cowden, MD

## 2022-06-29 NOTE — Telephone Encounter (Signed)
Requested medications are due for refill today.  See pharmacy note  Requested medications are on the active medications list.  yes  Last refill. 06/29/2022 #15 0 rf  Future visit scheduled.   no  Notes to clinic.  Pharmacy comment: Alternative Requested:PA NEEDED INS WILL ONLY PAY FOR 1 TAB DAILY.     Requested Prescriptions  Pending Prescriptions Disp Refills   escitalopram (LEXAPRO) 20 MG tablet [Pharmacy Med Name: ESCITALOPRAM 20 MG TABLET] 135 tablet 0    Sig: TAKE 1 AND 1/2 TABLETS DAILY BY MOUTH     Psychiatry:  Antidepressants - SSRI Passed - 06/29/2022 12:43 PM      Passed - Valid encounter within last 6 months    Recent Outpatient Visits           3 weeks ago Protracted bacterial bronchitis (HCC)   [place] [facility] Medicine at [facility], Nyoka Cowden, MD   2 months ago Environmental and seasonal allergies   [place] [facility] Medicine at [facility], Nyoka Cowden, MD   3 months ago Upper respiratory tract infection, unspecified type   [facility] Primary Care & Sports Medicine at [facility], Nyoka Cowden, MD   7 months ago Essential hypertension   [place] [facility] Medicine at [facility], Nyoka Cowden, MD   1 year ago Mood disorder [facility])   [facility] Primary Care & Sports Medicine at [facility], Nyoka Cowden, MD

## 2022-07-14 ENCOUNTER — Other Ambulatory Visit: Payer: Self-pay | Admitting: Internal Medicine

## 2022-07-14 DIAGNOSIS — I10 Essential (primary) hypertension: Secondary | ICD-10-CM

## 2022-07-16 NOTE — Telephone Encounter (Signed)
D/C 06/08/22. Requested Prescriptions  Refused Prescriptions Disp Refills   hydrochlorothiazide (HYDRODIURIL) 25 MG tablet [Pharmacy Med Name: HYDROCHLOROTHIAZIDE 25 MG TAB] 90 tablet 1    Sig: TAKE 1 TABLET BY MOUTH EVERY DAY     Cardiovascular: Diuretics - Thiazide Failed - 07/14/2022  1:01 AM      Failed - Cr in normal range and within 180 days    Creatinine  Date Value Ref Range Status  05/29/2022 1.2 (A) 0.5 - 1.1 Final   Creatinine, Ser  Date Value Ref Range Status  11/15/2021 0.99 0.57 - 1.00 mg/dL Final   Creatinine, Urine  Date Value Ref Range Status  05/29/2022 264  Final         Passed - K in normal range and within 180 days    Potassium  Date Value Ref Range Status  05/29/2022 4.1 3.5 - 5.1 mEq/L Final         Passed - Na in normal range and within 180 days    Sodium  Date Value Ref Range Status  05/29/2022 139 137 - 147 Final         Passed - Last BP in normal range    BP Readings from Last 1 Encounters:  06/08/22 122/70         Passed - Valid encounter within last 6 months    Recent Outpatient Visits           1 month ago Protracted bacterial bronchitis (HCC)   [place] [facility] Medicine at [facility], Nyoka Cowden, MD   2 months ago Environmental and seasonal allergies   [place] [facility] Care [facility] Medicine at [facility], Nyoka Cowden, MD   4 months ago Upper respiratory tract infection, unspecified type   [facility] Primary Care & Sports Medicine at [facility], Nyoka Cowden, MD   8 months ago Essential hypertension   [place] Primary Care & Sports Medicine at [facility], Nyoka Cowden, MD   1 year ago Mood disorder [facility])   [facility] Primary Care & Sports Medicine at [facility], Nyoka Cowden, MD

## 2022-07-17 IMAGING — US US PELVIS COMPLETE WITH TRANSVAGINAL
1 series · 14 of 25 positions shown · non-contrast
Comparison: None

CLINICAL DATA: Pelvic pain for 1 month, prior hysterectomy, still
has ovaries; postmenopausal



[Series 1: us pelvis complete with transvaginal · 0.24mm/px · 49 acquisitions, 14 frames shown]
[im 1/49]
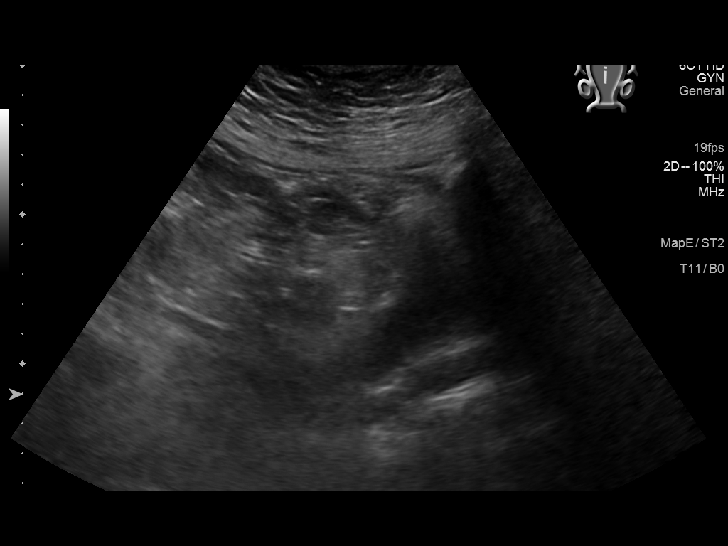
[im 5/49]
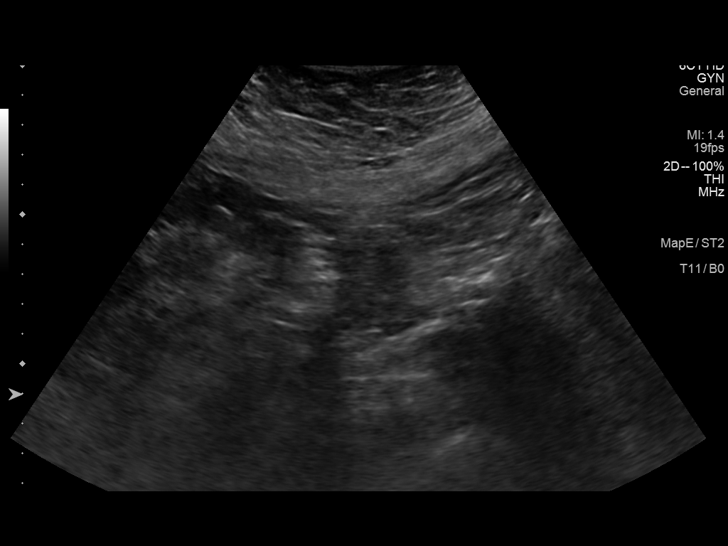
[im 9/49]
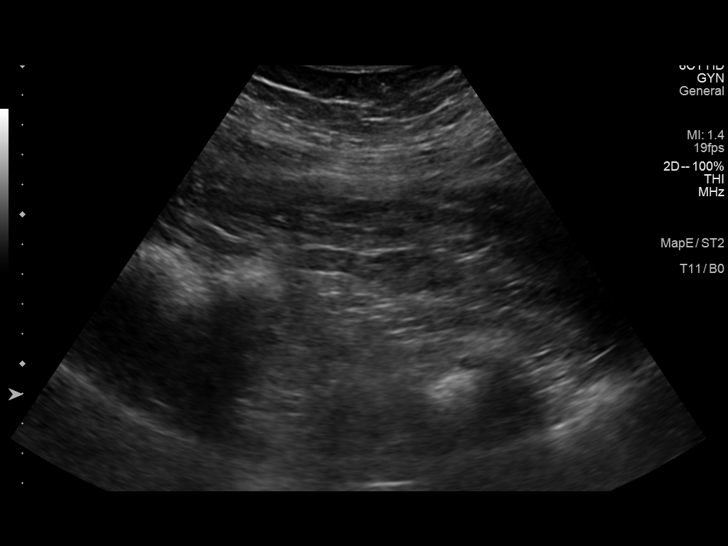
[im 13/49]
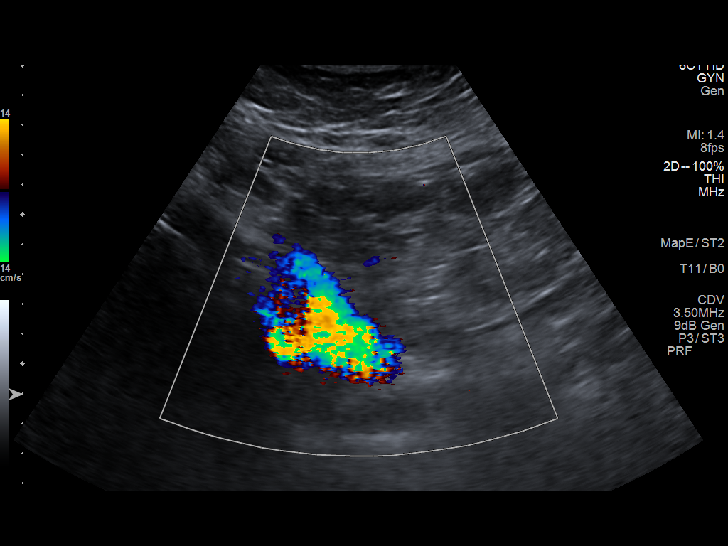
[im 17/49]
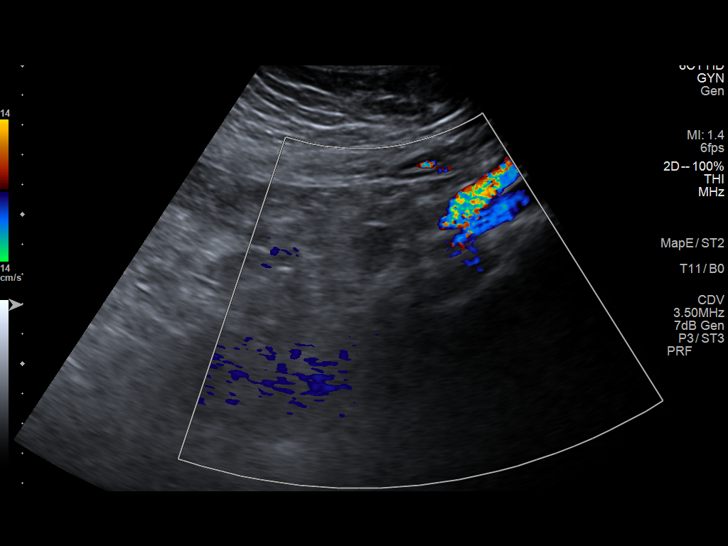
[im 19/49]
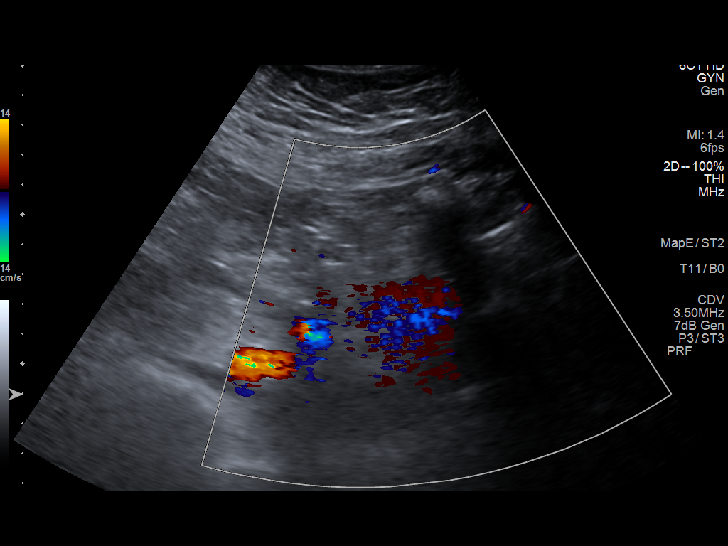
[im 23/49]
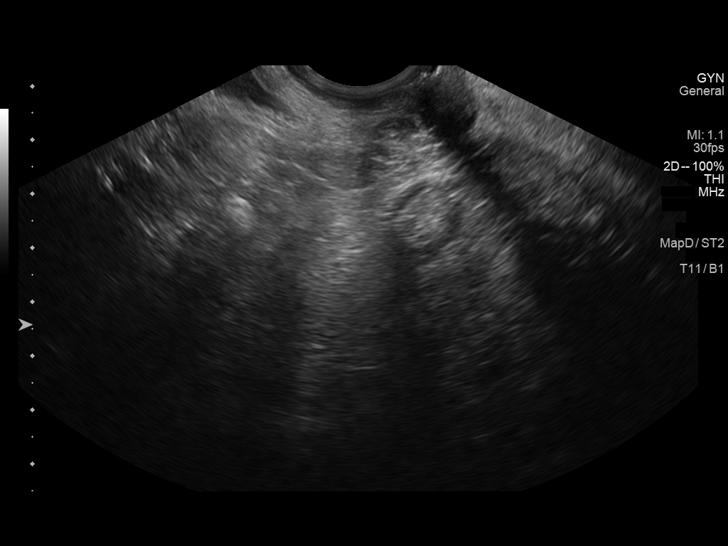
[im 27/49]
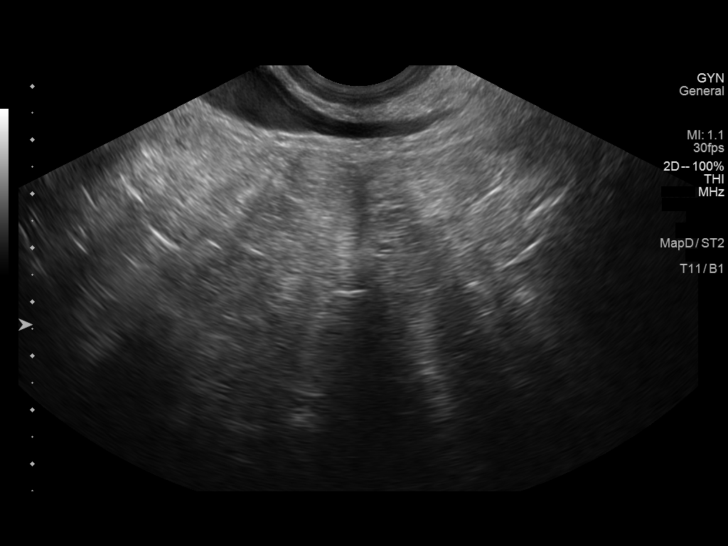
[im 31/49]
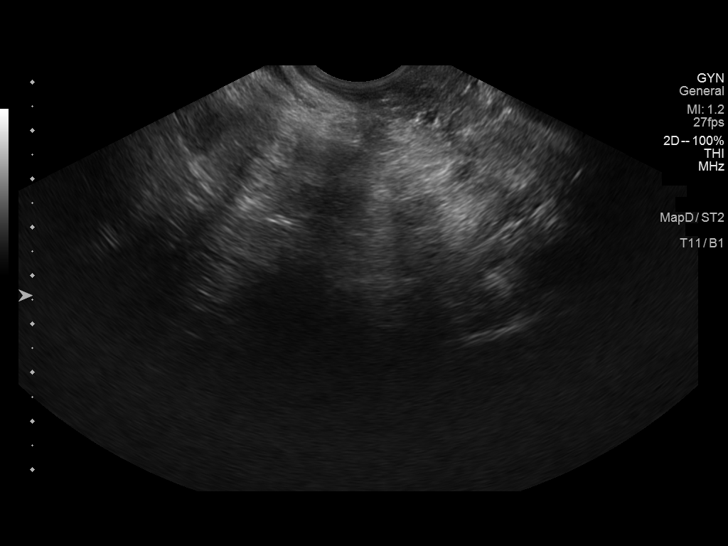
[im 33/49]
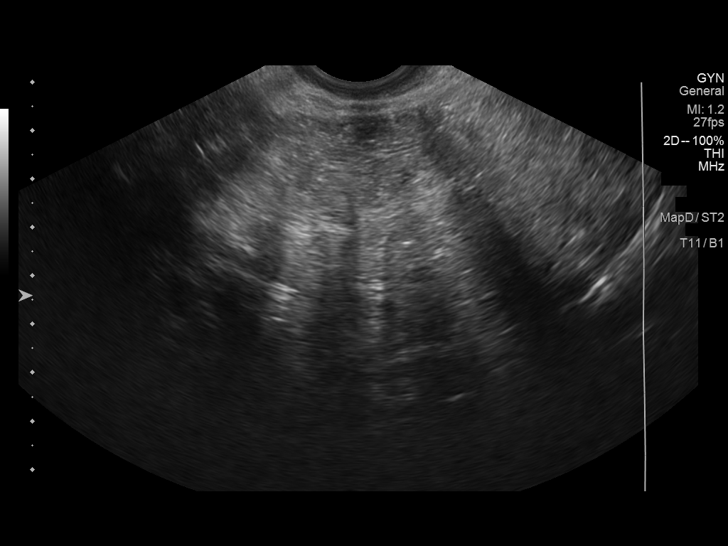
[im 37/49]
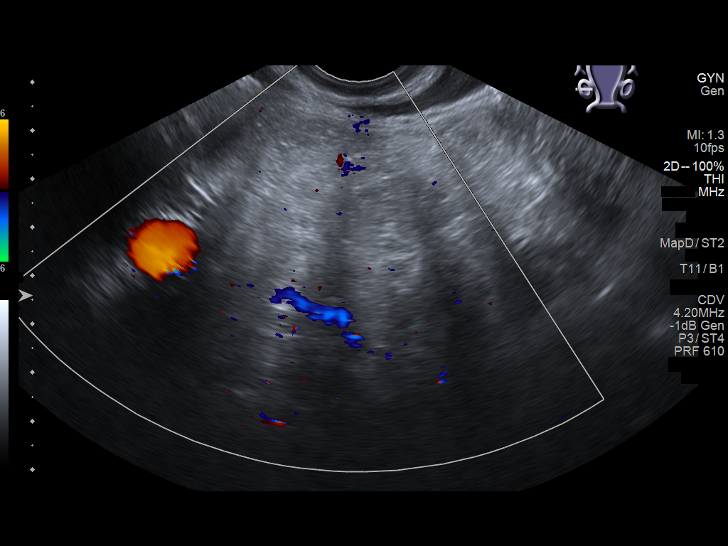
[im 41/49]
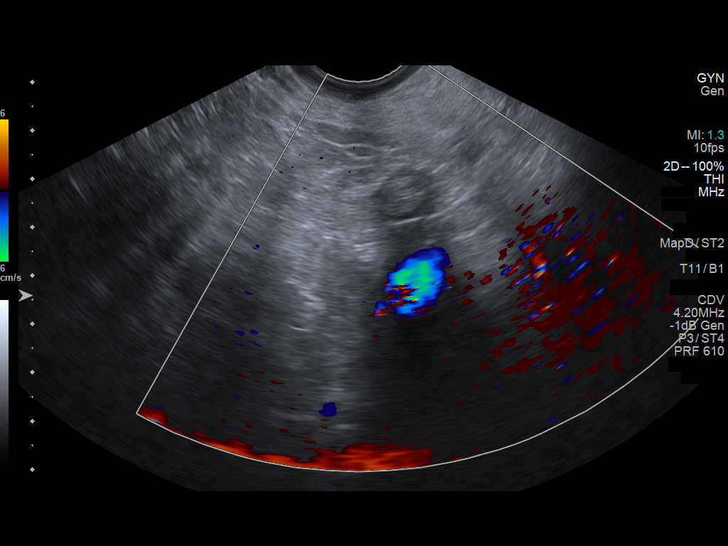
[im 45/49]
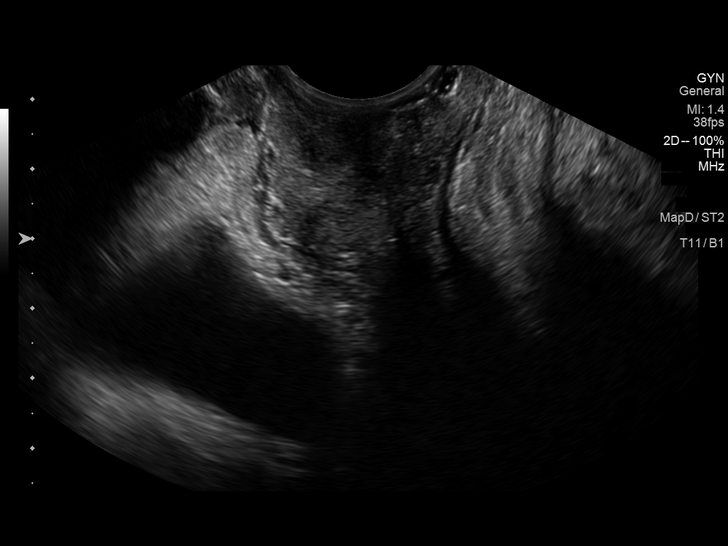
[im 49/49]
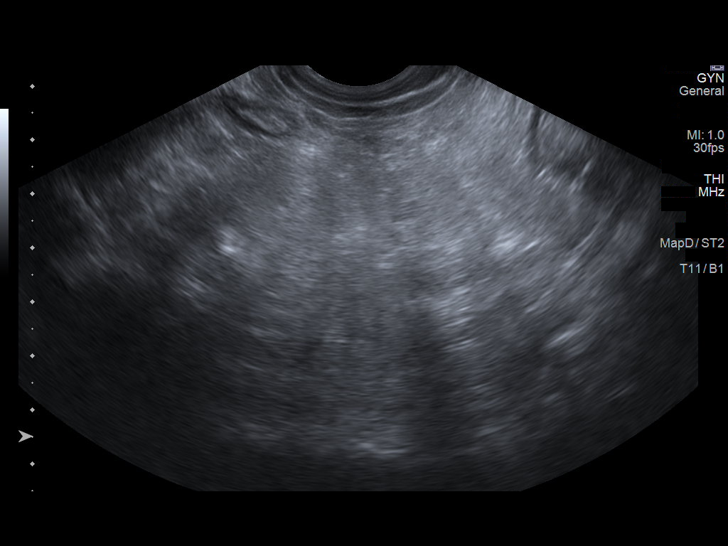

[14 of 25 positions shown; findings below may reference images not displayed]

FINDINGS: Uterus

Surgically absent

Endometrium

N/A

Right ovary

Not visualized, likely obscured by bowel

Left ovary

Not visualized, likely obscured by bowel

Other findings

There appears to be retained cervix. No obvious cervical mass. No
free pelvic fluid or adnexal mass is seen.
IMPRESSION: Surgical absence of uterus with probable retained cervix.

Nonvisualization of ovaries.

No pelvic sonographic abnormalities identified.

## 2022-07-31 ENCOUNTER — Ambulatory Visit: Payer: BLUE CROSS/BLUE SHIELD | Admitting: Internal Medicine

## 2022-07-31 ENCOUNTER — Encounter: Payer: Self-pay | Admitting: Internal Medicine

## 2022-07-31 VITALS — BP 118/74 | HR 85 | Ht 66.0 in | Wt 204.0 lb

## 2022-07-31 DIAGNOSIS — B351 Tinea unguium: Secondary | ICD-10-CM

## 2022-07-31 NOTE — Progress Notes (Signed)
Date:  07/31/2022   Name:  Tonyetta Berko   DOB:  09-30-1960   MRN:  [id number]   Chief Complaint: Nail Problem (3 weeks ago go patient had acrylic put on Great Right Toe and when she went to nail salon today and they told her she has toenail fungus.)  HPI Nail changes - she split her right great toe nail several months ago.  Acrylic was applied to keep the nail together.  She has had dark polish on the nail until 3 weeks ago.  [place]. Her nail was normal color at that time.  Today she went to the salon here and when the polish was removed, her nail was partially discolored - grey and greenish with a normal 2-3 mm nail bed.  She is concerned about nail fungus and treatment.  Lab Results  Component Value Date   NA 139 05/29/2022   K 4.1 05/29/2022   CO2 26 (A) 05/29/2022   GLUCOSE 86 11/15/2021   BUN 12 05/29/2022   CREATININE 1.2 (A) 05/29/2022   CALCIUM 9.2 11/15/2021   EGFR 52 05/29/2022   GFRNONAA 92 03/18/2019   Lab Results  Component Value Date   CHOL 154 11/15/2021   HDL 45 11/15/2021   LDLCALC 82 11/15/2021   TRIG 158 (H) 11/15/2021   CHOLHDL 3.4 11/15/2021   Lab Results  Component Value Date   TSH 0.02 (A) 09/26/2021   Lab Results  Component Value Date   HGBA1C 5.1 02/13/2022   Lab Results  Component Value Date   WBC 11.5 04/27/2021   HGB 12.7 04/27/2021   HCT 37 04/27/2021   MCV 92 01/07/2019   PLT 221 04/27/2021   Lab Results  Component Value Date   ALT 17 11/15/2021   AST 25 11/15/2021   ALKPHOS 98 11/15/2021   BILITOT 0.6 11/15/2021   No results found for: "25OHVITD2", "25OHVITD3", "VD25OH"   Review of Systems  Constitutional:  Negative for chills and fever.  Skin:  Positive for color change (of right great nail - no pain or swelling). Negative for rash.    Patient Active Problem List   Diagnosis Date Noted   Diabetes mellitus treated with injections of non-insulin  medication (HCC) 06/08/2022   Low bone mass 02/13/2022   Myeloradiculopathy 07/19/2021   Post-surgical hypothyroidism 03/22/2021   Osteopenia of hip 09/05/2020   On corticosteroid therapy 08/05/2020   Status post hysterectomy 03/01/2020   Diabetes mellitus treated with oral medication (HCC) 01/07/2019   Mood disorder (HCC) 01/07/2019   S/P total thyroidectomy 12/16/2017   Multiple thyroid nodules 11/12/2017   Hyperlipidemia, mild 08/03/2016   Arthritis of knee, left 08/02/2016   Back muscle spasm 07/03/2014   H/O peptic ulcer 07/03/2014   Migraine without aura and responsive to treatment 07/03/2014   Muscle contraction headache 07/03/2014   Idiopathic insomnia 07/03/2014   Tachycardia 07/03/2014    Allergies  Allergen Reactions   Cefdinir Diarrhea   Morphine And Codeine Nausea And Vomiting   Egg-Derived Products    Tape Hives and Rash    No [paper tape.  Tega derm OK    Past Surgical History:  Procedure Laterality Date   ANTERIOR FUSION CERVICAL SPINE  2006   BREAST REDUCTION SURGERY     CHOLECYSTECTOMY  05/05/2019   COLONOSCOPY WITH PROPOFOL N/A 08/24/2020   Procedure: COLONOSCOPY WITH PROPOFOL;  Surgeon: Norma Fredrickson, Boykin Nearing, MD;  Location: [facility]  ENDOSCOPY;  Service: Gastroenterology;  Laterality: N/A;  DM   ESOPHAGOGASTRODUODENOSCOPY  2011   gastric ulcer   ESOPHAGOGASTRODUODENOSCOPY (EGD) WITH PROPOFOL N/A 08/24/2020   Procedure: ESOPHAGOGASTRODUODENOSCOPY (EGD) WITH PROPOFOL;  Surgeon: Toledo, Boykin Nearing, MD;  Location: [facility] ENDOSCOPY;  Service: Gastroenterology;  Laterality: N/A;   LITHOTRIPSY  2012   with setnet   LUMBAR DISC SURGERY     NASAL SINUS SURGERY  2015   partail knee replacement Bilateral    REDUCTION MAMMAPLASTY Bilateral 2011   REPAIR TENDONS FOOT Left    THYROIDECTOMY Bilateral 12/16/2017   Procedure: THYROIDECTOMY;  Surgeon: Vernie Murders, MD;  Location: [facility] ORS;  Service: ENT;  Laterality: Bilateral;   TONSILLECTOMY     TOTAL ABDOMINAL  HYSTERECTOMY     Ovaries Remain.   TUBAL LIGATION      Social History   Tobacco Use   Smoking status: Never   Smokeless tobacco: Never  Vaping Use   Vaping Use: Never used  Substance Use Topics   Alcohol use: Yes    Alcohol/week: 0.0 - 1.0 standard drinks of alcohol    Comment: on occassion   Drug use: Not Currently    Types: Marijuana     Medication list has been reviewed and updated.  Current Meds  Medication Sig   albuterol (VENTOLIN HFA) 108 (90 Base) MCG/ACT inhaler Inhale 2 puffs into the lungs every 6 (six) hours as needed for wheezing or shortness of breath.   alendronate (FOSAMAX) 70 MG tablet Take 70 mg by mouth once a week.   atorvastatin (LIPITOR) 10 MG tablet TAKE (1) TABLET BY MOUTH EVERY DAY   calcium citrate-vitamin D (CITRACAL+D) 315-200 MG-UNIT tablet Take 2 tablets by mouth 2 (two) times daily.   carbamazepine (CARBATROL) 100 MG 12 hr capsule Take 100 mg by mouth 2 (two) times daily.   escitalopram (LEXAPRO) 20 MG tablet TAKE 1.5 TABS BY MOUTH DAILY   estradiol (ESTRACE) 0.1 MG/GM vaginal cream Place vaginally.   fluticasone (FLONASE) 50 MCG/ACT nasal spray Place 2 sprays into both nostrils daily.   ibuprofen (ADVIL,MOTRIN) 600 MG tablet Take 1 tablet (600 mg total) by mouth every 6 (six) hours as needed.   levothyroxine (SYNTHROID) 175 MCG tablet Take 175 mcg by mouth daily.   metFORMIN (GLUCOPHAGE-XR) 500 MG 24 hr tablet Take 500 mg by mouth 2 (two) times daily. 2 in AM 2 in PM   MYRBETRIQ 25 MG TB24 tablet    OZEMPIC, 0.25 OR 0.5 MG/DOSE, 2 MG/3ML SOPN Inject 0.25 mLs into the skin once a week. Endocrinology   pantoprazole (PROTONIX) 40 MG tablet TAKE 1 TABLET BY MOUTH TWICE A DAY   pregabalin (LYRICA) 75 MG capsule Take by mouth daily. 6 tablets daily   tiZANidine (ZANAFLEX) 4 MG tablet TAKE (1) TABLET BY MOUTH DAILY AS NEEDEDFOR MUSCLE SPASMS.   valACYclovir (VALTREX) 1000 MG tablet Take 1,000 mg by mouth daily.   zaleplon (SONATA) 10 MG capsule TAKE  (1) CAPSULE BY MOUTH EVERY NIGHT AT BEDTIME AS NEEDED FOR SLEEP       07/31/2022    1:25 PM 06/08/2022    1:49 PM 04/30/2022   10:27 AM 11/15/2021    2:45 PM  GAD 7 : Generalized Anxiety Score  Nervous, Anxious, on Edge 0 0 0 0  Control/stop worrying 0 0 0 0  Worry too much - different things 0 0 0 0  Trouble relaxing 0 0 0 1  Restless 0 0 0 0  Easily annoyed or irritable 0  0 0 0  Afraid - awful might happen 0 0 0 0  Total GAD 7 Score 0 0 0 1  Anxiety Difficulty Not difficult at all Not difficult at all Not difficult at all Not difficult at all       07/31/2022    1:25 PM 06/08/2022    1:49 PM 04/30/2022   10:27 AM  Depression screen PHQ 2/9  Decreased Interest 1 0 0  Down, Depressed, Hopeless 0 0 0  PHQ - 2 Score 1 0 0  Altered sleeping 1 0 0  Tired, decreased energy 1 2 0  Change in appetite 0 1 0  Feeling bad or failure about yourself  0 0 0  Trouble concentrating 1 1 0  Moving slowly or fidgety/restless 0 0 0  Suicidal thoughts 0 0 0  PHQ-9 Score 4 4 0  Difficult doing work/chores Not difficult at all Not difficult at all Not difficult at all    BP Readings from Last 3 Encounters:  07/31/22 118/74  06/08/22 122/70  04/30/22 102/60    Physical Exam Vitals and nursing note reviewed.  Constitutional:      General: She is not in acute distress.    Appearance: She is well-developed.  HENT:     Head: Normocephalic and atraumatic.  Pulmonary:     Effort: Pulmonary effort is normal. No respiratory distress.  Skin:    General: Skin is warm and dry.     Findings: No rash.     Comments: Right great toe nail with normal nail bed and proximal 3 mm of nail.  The center of the nail, sparing the edges is discolored.  There is a layer of clear acrylic on top.  Neurological:     Mental Status: She is alert and oriented to person, place, and time.  Psychiatric:        Mood and Affect: Mood normal.        Behavior: Behavior normal.     Wt Readings from Last 3 Encounters:   07/31/22 204 lb (92.5 kg)  06/08/22 208 lb (94.3 kg)  04/30/22 212 lb (96.2 kg)    BP 118/74   Pulse 85   Ht 5\' 6"  (1.676 m)   Wt 204 lb (92.5 kg)   SpO2 95%   BMI 32.93 kg/m   Assessment and Plan:  Problem List Items Addressed This Visit   None Visit Diagnoses     Onychomycosis    -  Primary   appears to have fungal involvement of the nail  she is minimally symptomatic - will not treat  removing the acrylic is problematic allow the nail to grow out       No follow-ups on file.   Partially dictated using Dragon software, any errors are not intentional.  Reubin Milan, MD [facility] [place], [place]

## 2022-08-17 LAB — TSH: TSH: 0.17 — AB (ref 0.41–5.90)

## 2022-08-22 ENCOUNTER — Other Ambulatory Visit: Payer: Self-pay | Admitting: Internal Medicine

## 2022-08-22 DIAGNOSIS — B351 Tinea unguium: Secondary | ICD-10-CM

## 2022-08-22 MED ORDER — FLUCONAZOLE 150 MG PO TABS
150.0000 mg | ORAL_TABLET | ORAL | 0 refills | Status: DC
Start: 2022-08-22 — End: 2022-11-21

## 2022-08-25 ENCOUNTER — Other Ambulatory Visit: Payer: Self-pay | Admitting: Internal Medicine

## 2022-08-25 DIAGNOSIS — I10 Essential (primary) hypertension: Secondary | ICD-10-CM

## 2022-08-27 ENCOUNTER — Encounter: Payer: Self-pay | Admitting: Internal Medicine

## 2022-08-27 NOTE — Telephone Encounter (Signed)
Requested Prescriptions  Refused Prescriptions Disp Refills   lisinopril (ZESTRIL) 10 MG tablet [Pharmacy Med Name: LISINOPRIL 10 MG TABLET] 90 tablet 1    Sig: TAKE 1 TABLET BY MOUTH EVERY DAY     Cardiovascular:  ACE Inhibitors Failed - 08/25/2022 11:05 AM      Failed - Cr in normal range and within 180 days    Creatinine  Date Value Ref Range Status  05/29/2022 1.2 (A) 0.5 - 1.1 Final   Creatinine, Ser  Date Value Ref Range Status  11/15/2021 0.99 0.57 - 1.00 mg/dL Final   Creatinine, Urine  Date Value Ref Range Status  05/29/2022 264  Final         Passed - K in normal range and within 180 days    Potassium  Date Value Ref Range Status  05/29/2022 4.1 3.5 - 5.1 mEq/L Final         Passed - Patient is not pregnant      Passed - Last BP in normal range    BP Readings from Last 1 Encounters:  07/31/22 118/74         Passed - Valid encounter within last 6 months    Recent Outpatient Visits           3 weeks ago Onychomycosis   [facility], Nyoka Cowden, MD   2 months ago Protracted bacterial bronchitis [facility])   [facility] at [facility], Nyoka Cowden, MD   3 months ago Environmental and seasonal allergies    [facility] Medicine at [facility], Nyoka Cowden, MD   5 months ago Upper respiratory tract infection, unspecified type   [facility] at [facility], Nyoka Cowden, MD   9 months ago Essential hypertension   [facility] at [facility], Nyoka Cowden, MD

## 2022-09-15 ENCOUNTER — Other Ambulatory Visit: Payer: Self-pay | Admitting: Internal Medicine

## 2022-09-15 DIAGNOSIS — F5101 Primary insomnia: Secondary | ICD-10-CM

## 2022-09-28 ENCOUNTER — Telehealth: Payer: Self-pay | Admitting: Internal Medicine

## 2022-09-28 NOTE — Telephone Encounter (Signed)
It appears that she only saw Endo for Thyroid issues and NOT diabetes.  She is overdue for A1C and general follow up.  Please schedule.

## 2022-10-22 ENCOUNTER — Encounter: Payer: Self-pay | Admitting: Internal Medicine

## 2022-10-29 LAB — HM DEXA SCAN

## 2022-11-01 DIAGNOSIS — D1801 Hemangioma of skin and subcutaneous tissue: Secondary | ICD-10-CM | POA: Diagnosis not present

## 2022-11-01 DIAGNOSIS — D485 Neoplasm of uncertain behavior of skin: Secondary | ICD-10-CM | POA: Diagnosis not present

## 2022-11-11 ENCOUNTER — Other Ambulatory Visit: Payer: Self-pay | Admitting: Internal Medicine

## 2022-11-11 DIAGNOSIS — Z8711 Personal history of peptic ulcer disease: Secondary | ICD-10-CM

## 2022-11-13 ENCOUNTER — Encounter: Payer: BLUE CROSS/BLUE SHIELD | Admitting: Internal Medicine

## 2022-11-13 NOTE — Assessment & Plan Note (Deleted)
Blood sugars stable without hypoglycemic symptoms or events. Current regimen is metformin and Ozempic. Changes made last visit are none. Lab Results  Component Value Date   HGBA1C 5.1 02/13/2022

## 2022-11-13 NOTE — Assessment & Plan Note (Deleted)
LDL is  Lab Results  Component Value Date   LDLCALC 82 11/15/2021  Currently being treated with atorvastatin with good compliance and no concerns.

## 2022-11-14 NOTE — Progress Notes (Signed)
Error

## 2022-11-21 ENCOUNTER — Ambulatory Visit (INDEPENDENT_AMBULATORY_CARE_PROVIDER_SITE_OTHER): Payer: Medicare HMO | Admitting: Internal Medicine

## 2022-11-21 ENCOUNTER — Encounter: Payer: Self-pay | Admitting: Internal Medicine

## 2022-11-21 VITALS — BP 126/74 | HR 96 | Ht 66.0 in | Wt 205.0 lb

## 2022-11-21 DIAGNOSIS — Z7984 Long term (current) use of oral hypoglycemic drugs: Secondary | ICD-10-CM | POA: Diagnosis not present

## 2022-11-21 DIAGNOSIS — D8689 Sarcoidosis of other sites: Secondary | ICD-10-CM

## 2022-11-21 DIAGNOSIS — F39 Unspecified mood [affective] disorder: Secondary | ICD-10-CM | POA: Diagnosis not present

## 2022-11-21 DIAGNOSIS — E118 Type 2 diabetes mellitus with unspecified complications: Secondary | ICD-10-CM | POA: Diagnosis not present

## 2022-11-21 DIAGNOSIS — E785 Hyperlipidemia, unspecified: Secondary | ICD-10-CM | POA: Diagnosis not present

## 2022-11-21 DIAGNOSIS — Z23 Encounter for immunization: Secondary | ICD-10-CM

## 2022-11-21 MED ORDER — ESCITALOPRAM OXALATE 20 MG PO TABS: 20.0000 mg | ORAL_TABLET | Freq: Every day | ORAL | 0 refills | Status: DC

## 2022-11-21 NOTE — Progress Notes (Signed)
Date:  11/21/2022   Name:  Gabrielle Tucker   DOB:  [birth year]   MRN:  [id number]   Chief Complaint: Diabetes  Diabetes She presents for her follow-up diabetic visit. She has type 2 diabetes mellitus. Pertinent negatives for hypoglycemia include no headaches or tremors. Pertinent negatives for diabetes include no chest pain, no fatigue, no polydipsia and no polyuria. Current diabetic treatments: metformin and ozempic.   Nail fungus - right great nail.  Completed 12 weeks of Diflucan and now new normal nail is growing out.  Joint pain - in both hands, stiffness and pain, worse during the night.  Not taking advil regularly.  Rheum evaluation --- OA.  Review of Systems  Constitutional:  Negative for appetite change, fatigue, fever and unexpected weight change.  HENT:  Negative for tinnitus and trouble swallowing.   Eyes:  Negative for visual disturbance.  Respiratory:  Negative for cough, chest tightness and shortness of breath.   Cardiovascular:  Negative for chest pain, palpitations and leg swelling.  Gastrointestinal:  Negative for abdominal pain.  Endocrine: Negative for polydipsia and polyuria.  Genitourinary:  Negative for dysuria and hematuria.  Musculoskeletal:  Negative for arthralgias.  Neurological:  Negative for tremors, numbness and headaches.  Psychiatric/Behavioral:  Negative for dysphoric mood.      Lab Results  Component Value Date   NA 139 05/29/2022   K 4.1 05/29/2022   CO2 26 (A) 05/29/2022   GLUCOSE 86 11/15/2021   BUN 12 05/29/2022   CREATININE 1.2 (A) 05/29/2022   CALCIUM 9.2 11/15/2021   EGFR 52 05/29/2022   GFRNONAA 92 03/18/2019   Lab Results  Component Value Date   CHOL 154 11/15/2021   HDL 45 11/15/2021   LDLCALC 82 11/15/2021   TRIG 158 (H) 11/15/2021   CHOLHDL 3.4 11/15/2021   Lab Results  Component Value Date   TSH 0.17 (A) 08/17/2022   Lab Results  Component Value Date   HGBA1C 5.1 02/13/2022   Lab Results  Component Value  Date   WBC 11.5 04/27/2021   HGB 12.7 04/27/2021   HCT 37 04/27/2021   MCV 92 01/07/2019   PLT 221 04/27/2021   Lab Results  Component Value Date   ALT 17 11/15/2021   AST 25 11/15/2021   ALKPHOS 98 11/15/2021   BILITOT 0.6 11/15/2021   No results found for: "25OHVITD2", "25OHVITD3", "VD25OH"   Patient Active Problem List   Diagnosis Date Noted   Low bone mass 02/13/2022   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 07/19/2021   Post-surgical hypothyroidism 03/22/2021   Osteopenia of hip 09/05/2020   On corticosteroid therapy 08/05/2020   Status post hysterectomy 03/01/2020   Type II diabetes mellitus with complication (HCC) 01/07/2019   Mood disorder (HCC) 01/07/2019   S/P total thyroidectomy 12/16/2017   Multiple thyroid nodules 11/12/2017   Hyperlipidemia associated with type 2 diabetes mellitus (HCC) 08/03/2016   Arthritis of knee, left 08/02/2016   Back muscle spasm 07/03/2014   H/O peptic ulcer 07/03/2014   Migraine without aura and responsive to treatment 07/03/2014   Muscle contraction headache 07/03/2014   Idiopathic insomnia 07/03/2014   Tachycardia 07/03/2014    Allergies  Allergen Reactions   Cefdinir Diarrhea   Morphine And Codeine Nausea And Vomiting   Egg-Derived Products    Tape Hives and Rash    No [paper tape.  Tega derm OK    Past Surgical History:  Procedure Laterality Date   ANTERIOR FUSION CERVICAL SPINE  2006   BREAST  REDUCTION SURGERY     CHOLECYSTECTOMY  05/05/2019   COLONOSCOPY WITH PROPOFOL N/A 08/24/2020   Procedure: COLONOSCOPY WITH PROPOFOL;  Surgeon: Toledo, Boykin Nearing, MD;  Location: [facility] ENDOSCOPY;  Service: Gastroenterology;  Laterality: N/A;  DM   ESOPHAGOGASTRODUODENOSCOPY  2011   gastric ulcer   ESOPHAGOGASTRODUODENOSCOPY (EGD) WITH PROPOFOL N/A 08/24/2020   Procedure: ESOPHAGOGASTRODUODENOSCOPY (EGD) WITH PROPOFOL;  Surgeon: Toledo, Boykin Nearing, MD;  Location: [facility] ENDOSCOPY;  Service: Gastroenterology;  Laterality: N/A;   LITHOTRIPSY  2012    with setnet   LUMBAR DISC SURGERY     NASAL SINUS SURGERY  2015   partail knee replacement Bilateral    REDUCTION MAMMAPLASTY Bilateral 2011   REPAIR TENDONS FOOT Left    THYROIDECTOMY Bilateral 12/16/2017   Procedure: THYROIDECTOMY;  Surgeon: Vernie Murders, MD;  Location: [facility] ORS;  Service: ENT;  Laterality: Bilateral;   TONSILLECTOMY     TOTAL ABDOMINAL HYSTERECTOMY     Ovaries Remain.   TUBAL LIGATION      Social History   Tobacco Use   Smoking status: Never   Smokeless tobacco: Never  Vaping Use   Vaping status: Never Used  Substance Use Topics   Alcohol use: Yes    Alcohol/week: 0.0 - 1.0 standard drinks of alcohol    Comment: on occassion   Drug use: Not Currently    Types: Marijuana     Medication list has been reviewed and updated.  Current Meds  Medication Sig   albuterol (VENTOLIN HFA) 108 (90 Base) MCG/ACT inhaler Inhale 2 puffs into the lungs every 6 (six) hours as needed for wheezing or shortness of breath.   alendronate (FOSAMAX) 70 MG tablet Take 70 mg by mouth once a week.   atorvastatin (LIPITOR) 10 MG tablet TAKE (1) TABLET BY MOUTH EVERY DAY   calcium citrate-vitamin D (CITRACAL+D) 315-200 MG-UNIT tablet Take 2 tablets by mouth 2 (two) times daily.   carbamazepine (CARBATROL) 200 MG 12 hr capsule Take by mouth 2 (two) times daily.   estradiol (ESTRACE) 0.1 MG/GM vaginal cream Place vaginally.   fluticasone (FLONASE) 50 MCG/ACT nasal spray Place 2 sprays into both nostrils daily.   levothyroxine (SYNTHROID) 175 MCG tablet Take 175 mcg by mouth daily.   lisinopril (ZESTRIL) 10 MG tablet Take 10 mg by mouth daily.   metFORMIN (GLUCOPHAGE-XR) 500 MG 24 hr tablet Take 500 mg by mouth 2 (two) times daily. 2 in AM 2 in PM   MYRBETRIQ 25 MG TB24 tablet    OZEMPIC, 0.25 OR 0.5 MG/DOSE, 2 MG/3ML SOPN Inject 0.25 mLs into the skin once a week. Endocrinology   pantoprazole (PROTONIX) 40 MG tablet TAKE 1 TABLET BY MOUTH TWICE A DAY   pregabalin (LYRICA) 150 MG  capsule Take 150 mg by mouth 2 (two) times daily.   solifenacin (VESICARE) 10 MG tablet Take 10 mg by mouth daily.   tiZANidine (ZANAFLEX) 4 MG tablet TAKE (1) TABLET BY MOUTH DAILY AS NEEDEDFOR MUSCLE SPASMS.   zaleplon (SONATA) 10 MG capsule TAKE (1) CAPSULE BY MOUTH EVERY NIGHT AT BEDTIME AS NEEDED FOR SLEEP   [DISCONTINUED] escitalopram (LEXAPRO) 20 MG tablet TAKE 1.5 TABS BY MOUTH DAILY   [DISCONTINUED] fluconazole (DIFLUCAN) 150 MG tablet Take 1 tablet (150 mg total) by mouth once a week.   [DISCONTINUED] ibuprofen (ADVIL,MOTRIN) 600 MG tablet Take 1 tablet (600 mg total) by mouth every 6 (six) hours as needed.   [DISCONTINUED] valACYclovir (VALTREX) 1000 MG tablet Take 1,000 mg by mouth daily.  11/21/2022    1:37 PM 07/31/2022    1:25 PM 06/08/2022    1:49 PM 04/30/2022   10:27 AM  GAD 7 : Generalized Anxiety Score  Nervous, Anxious, on Edge 0 0 0 0  Control/stop worrying 0 0 0 0  Worry too much - different things 0 0 0 0  Trouble relaxing 0 0 0 0  Restless 1 0 0 0  Easily annoyed or irritable 0 0 0 0  Afraid - awful might happen 0 0 0 0  Total GAD 7 Score 1 0 0 0  Anxiety Difficulty Not difficult at all Not difficult at all Not difficult at all Not difficult at all       11/21/2022    1:36 PM 07/31/2022    1:25 PM 06/08/2022    1:49 PM  Depression screen PHQ 2/9  Decreased Interest 0 1 0  Down, Depressed, Hopeless 0 0 0  PHQ - 2 Score 0 1 0  Altered sleeping 0 1 0  Tired, decreased energy 2 1 2   Change in appetite 0 0 1  Feeling bad or failure about yourself  0 0 0  Trouble concentrating 1 1 1   Moving slowly or fidgety/restless 0 0 0  Suicidal thoughts 0 0 0  PHQ-9 Score 3 4 4   Difficult doing work/chores Somewhat difficult Not difficult at all Not difficult at all    BP Readings from Last 3 Encounters:  11/21/22 126/74  07/31/22 118/74  06/08/22 122/70    Physical Exam Vitals and nursing note reviewed.  Constitutional:      General: She is not in acute  distress.    Appearance: She is well-developed.  HENT:     Head: Normocephalic and atraumatic.  Cardiovascular:     Rate and Rhythm: Normal rate and regular rhythm.  Pulmonary:     Effort: Pulmonary effort is normal. No respiratory distress.  Musculoskeletal:     Right hand: Deformity, tenderness and bony tenderness present. Decreased range of motion.     Left hand: Deformity, tenderness and bony tenderness present. Decreased range of motion.     Right lower leg: No edema.     Left lower leg: No edema.     Comments: Bouchard and Heberden nodes bilaterally   Skin:    General: Skin is warm and dry.     Findings: No rash.  Neurological:     Mental Status: She is alert and oriented to person, place, and time.  Psychiatric:        Mood and Affect: Mood normal.        Behavior: Behavior normal.    Diabetic Foot Exam - Simple   Simple Foot Form Diabetic Foot exam was performed with the following findings: Yes 11/21/2022  1:47 PM  Visual Inspection No deformities, no ulcerations, no other skin breakdown bilaterally: Yes Sensation Testing See comments: Yes Pulse Check Posterior Tibialis and Dorsalis pulse intact bilaterally: Yes Comments Decreased sensation to light touch right foot;  normal sensation on left foot     Wt Readings from Last 3 Encounters:  11/21/22 205 lb (93 kg)  07/31/22 204 lb (92.5 kg)  06/08/22 208 lb (94.3 kg)    BP 126/74   Pulse 96   Ht 5\' 6"  (1.676 m)   Wt 205 lb (93 kg)   SpO2 95%   BMI 33.09 kg/m   Assessment and Plan:  Problem List Items Addressed This Visit       Unprioritized   Mood  disorder (HCC)    Clinically stable on current regimen with good control of symptoms, No SI or HI. On Lexapro and Sonata for sleep       Relevant Medications   escitalopram (LEXAPRO) 20 MG tablet   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    Being followed by Neurology Pain management and regular monitoring - Zanaflex, Carbatrol and Lyrica Bowel and bladder  dysfunction unchanged Primarily RLE weakness and numbness      Type II diabetes mellitus with complication (HCC) - Primary    Blood sugars stable without hypoglycemic symptoms or events. Current regimen is metformin and Ozempic. Changes made last visit are none. Also seeing Endo but at less than desirable intervals. Lab Results  Component Value Date   HGBA1C 5.1 02/13/2022         Relevant Medications   lisinopril (ZESTRIL) 10 MG tablet   Other Relevant Orders   Comprehensive metabolic panel   Hemoglobin A1c   Other Visit Diagnoses     Long term current use of oral hypoglycemic drug       Need for influenza vaccination       Relevant Orders   Flu vaccine trivalent PF, 6mos and older(Flulaval,Afluria,Fluarix,Fluzone) (Completed)   Hyperlipidemia, mild       Relevant Medications   lisinopril (ZESTRIL) 10 MG tablet   Other Relevant Orders   Lipid panel   Need for vaccination for pneumococcus       Relevant Orders   Pneumococcal conjugate vaccine 20-valent (Completed)       Return in about 6 months (around 05/22/2023) for DM, HTN, Depression.    Reubin Milan, MD [facility] [place]

## 2022-11-21 NOTE — Assessment & Plan Note (Signed)
Clinically stable on current regimen with good control of symptoms, No SI or HI. On Lexapro and Sonata for sleep

## 2022-11-21 NOTE — Assessment & Plan Note (Signed)
Being followed by Neurology Pain management and regular monitoring - Zanaflex, Carbatrol and Lyrica Bowel and bladder dysfunction unchanged Primarily RLE weakness and numbness

## 2022-11-21 NOTE — Assessment & Plan Note (Addendum)
Blood sugars stable without hypoglycemic symptoms or events. Current regimen is metformin and Ozempic. Changes made last visit are none. Also seeing Endo but at less than desirable intervals. Lab Results  Component Value Date   HGBA1C 5.1 02/13/2022

## 2022-11-22 LAB — COMPREHENSIVE METABOLIC PANEL
ALT: 10 [IU]/L (ref 0–32)
AST: 16 [IU]/L (ref 0–40)
Albumin: 4.4 g/dL (ref 3.9–4.9)
Alkaline Phosphatase: 142 [IU]/L — ABNORMAL HIGH (ref 44–121)
BUN/Creatinine Ratio: 15 (ref 12–28)
BUN: 14 mg/dL (ref 8–27)
Bilirubin Total: <0.2 mg/dL (ref 0.0–1.2)
CO2: 24 mmol/L (ref 20–29)
Calcium: 9.1 mg/dL (ref 8.7–10.3)
Chloride: 101 mmol/L (ref 96–106)
Creatinine, Ser: 0.95 mg/dL (ref 0.57–1.00)
Globulin, Total: 2.4 g/dL (ref 1.5–4.5)
Glucose: 83 mg/dL (ref 70–99)
Potassium: 4.2 mmol/L (ref 3.5–5.2)
Sodium: 140 mmol/L (ref 134–144)
Total Protein: 6.8 g/dL (ref 6.0–8.5)
eGFR: 68 mL/min/{1.73_m2} (ref >59)

## 2022-11-22 LAB — HEMOGLOBIN A1C
Est. average glucose Bld gHb Est-mCnc: 114 mg/dL
Hgb A1c MFr Bld: 5.6 % (ref 4.8–5.6)

## 2022-11-22 LAB — LIPID PANEL
Chol/HDL Ratio: 4.1 ratio (ref 0.0–4.4)
Cholesterol, Total: 223 mg/dL — ABNORMAL HIGH (ref 100–199)
HDL: 55 mg/dL (ref >39)
LDL Chol Calc (NIH): 121 mg/dL — ABNORMAL HIGH (ref 0–99)
Triglycerides: 269 mg/dL — ABNORMAL HIGH (ref 0–149)
VLDL Cholesterol Cal: 47 mg/dL — ABNORMAL HIGH (ref 5–40)

## 2022-12-02 ENCOUNTER — Encounter: Payer: Self-pay | Admitting: Internal Medicine

## 2022-12-04 ENCOUNTER — Encounter: Payer: Self-pay | Admitting: Internal Medicine

## 2022-12-04 ENCOUNTER — Telehealth (INDEPENDENT_AMBULATORY_CARE_PROVIDER_SITE_OTHER): Payer: Medicare HMO | Admitting: Internal Medicine

## 2022-12-04 VITALS — Ht 66.0 in

## 2022-12-04 DIAGNOSIS — F39 Unspecified mood [affective] disorder: Secondary | ICD-10-CM | POA: Diagnosis not present

## 2022-12-04 DIAGNOSIS — Z7985 Long-term (current) use of injectable non-insulin antidiabetic drugs: Secondary | ICD-10-CM | POA: Diagnosis not present

## 2022-12-04 DIAGNOSIS — Z7984 Long term (current) use of oral hypoglycemic drugs: Secondary | ICD-10-CM

## 2022-12-04 DIAGNOSIS — E1169 Type 2 diabetes mellitus with other specified complication: Secondary | ICD-10-CM | POA: Diagnosis not present

## 2022-12-04 DIAGNOSIS — E785 Hyperlipidemia, unspecified: Secondary | ICD-10-CM | POA: Diagnosis not present

## 2022-12-04 MED ORDER — BUSPIRONE HCL 10 MG PO TABS: 10.0000 mg | ORAL_TABLET | Freq: Two times a day (BID) | ORAL | 0 refills | Status: DC

## 2022-12-04 MED ORDER — ATORVASTATIN CALCIUM 20 MG PO TABS: ORAL_TABLET | ORAL | 1 refills | Status: DC

## 2022-12-04 NOTE — Assessment & Plan Note (Signed)
Having more anxiety lately - husband has upcoming cervical spine surgery; daughter and son with health issues She is not having panic attacks but is tearful at times and feeling her mind racings Will continue Lexapro Add Buspar - titrate up to 10 mg bid.

## 2022-12-04 NOTE — Patient Instructions (Signed)
Buspar 1/2 every morning for 3 days then  1/2 twice a day for 3 days then  1 in the morning and 1/2 in the evening for three days then  1 tablet twice a day.

## 2022-12-04 NOTE — Progress Notes (Signed)
Date:  12/04/2022   Name:  Malayzia Wadlington   DOB:  [birth year]   MRN:  [id number]  This encounter was conducted via video encounter. This platform was deemed appropriate for the issues to be addressed.  The patient was correctly identified.  I advised that I am conducting the visit from a secure room in my office at [facility] clinic.  The patient is located at home. The limitations of this form of encounter were discussed with the patient and he/she agreed to proceed.  Some vital signs will be absent.  Chief Complaint: depression and anxiety   Depression        This is a chronic problem.  The onset quality is undetermined.   Associated symptoms include insomnia and restlessness.  Associated symptoms include no fatigue and no headaches.  Past treatments include SSRIs - Selective serotonin reuptake inhibitors.  Compliance with treatment is good.  Past medical history includes anxiety.   Anxiety Presents for follow-up visit. Symptoms include excessive worry, feeling of choking, insomnia, irritability, nervous/anxious behavior and restlessness. Patient reports no chest pain, dizziness, palpitations, panic or shortness of breath.    Hypertension This is a chronic problem. The problem is controlled. Associated symptoms include anxiety. Pertinent negatives include no chest pain, headaches, palpitations or shortness of breath.    Review of Systems  Constitutional:  Positive for irritability. Negative for chills and fatigue.  Respiratory:  Negative for chest tightness and shortness of breath.   Cardiovascular:  Negative for chest pain and palpitations.  Neurological:  Negative for dizziness and headaches.  Psychiatric/Behavioral:  Positive for depression and dysphoric mood. Negative for sleep disturbance. The patient is nervous/anxious and has insomnia.      Lab Results  Component Value Date   NA 140 11/21/2022   K 4.2 11/21/2022   CO2 24 11/21/2022   GLUCOSE 83 11/21/2022   BUN 14  11/21/2022   CREATININE 0.95 11/21/2022   CALCIUM 9.1 11/21/2022   EGFR 68 11/21/2022   GFRNONAA 92 03/18/2019   Lab Results  Component Value Date   CHOL 223 (H) 11/21/2022   HDL 55 11/21/2022   LDLCALC 121 (H) 11/21/2022   TRIG 269 (H) 11/21/2022   CHOLHDL 4.1 11/21/2022   Lab Results  Component Value Date   TSH 0.17 (A) 08/17/2022   Lab Results  Component Value Date   HGBA1C 5.6 11/21/2022   Lab Results  Component Value Date   WBC 11.5 04/27/2021   HGB 12.7 04/27/2021   HCT 37 04/27/2021   MCV 92 01/07/2019   PLT 221 04/27/2021   Lab Results  Component Value Date   ALT 10 11/21/2022   AST 16 11/21/2022   ALKPHOS 142 (H) 11/21/2022   BILITOT <0.2 11/21/2022   No results found for: "25OHVITD2", "25OHVITD3", "VD25OH"   Patient Active Problem List   Diagnosis Date Noted   Low bone mass 02/13/2022   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 07/19/2021   Post-surgical hypothyroidism 03/22/2021   Osteopenia of hip 09/05/2020   On corticosteroid therapy 08/05/2020   Status post hysterectomy 03/01/2020   Type II diabetes mellitus with complication (HCC) 01/07/2019   Mood disorder (HCC) 01/07/2019   S/P total thyroidectomy 12/16/2017   Multiple thyroid nodules 11/12/2017   Hyperlipidemia associated with type 2 diabetes mellitus (HCC) 08/03/2016   Arthritis of knee, left 08/02/2016   Back muscle spasm 07/03/2014   H/O peptic ulcer 07/03/2014   Migraine without aura and responsive to treatment 07/03/2014   Muscle contraction  headache 07/03/2014   Idiopathic insomnia 07/03/2014   Tachycardia 07/03/2014    Allergies  Allergen Reactions   Cefdinir Diarrhea   Morphine And Codeine Nausea And Vomiting   Egg-Derived Products    Tape Hives and Rash    No [paper tape.  Tega derm OK    Past Surgical History:  Procedure Laterality Date   ANTERIOR FUSION CERVICAL SPINE  2006   BREAST REDUCTION SURGERY     CHOLECYSTECTOMY  05/05/2019   COLONOSCOPY WITH PROPOFOL N/A 08/24/2020    Procedure: COLONOSCOPY WITH PROPOFOL;  Surgeon: Toledo, Boykin Nearing, MD;  Location: [facility] ENDOSCOPY;  Service: Gastroenterology;  Laterality: N/A;  DM   ESOPHAGOGASTRODUODENOSCOPY  2011   gastric ulcer   ESOPHAGOGASTRODUODENOSCOPY (EGD) WITH PROPOFOL N/A 08/24/2020   Procedure: ESOPHAGOGASTRODUODENOSCOPY (EGD) WITH PROPOFOL;  Surgeon: Toledo, Boykin Nearing, MD;  Location: [facility] ENDOSCOPY;  Service: Gastroenterology;  Laterality: N/A;   LITHOTRIPSY  2012   with setnet   LUMBAR DISC SURGERY     NASAL SINUS SURGERY  2015   partail knee replacement Bilateral    REDUCTION MAMMAPLASTY Bilateral 2011   REPAIR TENDONS FOOT Left    THYROIDECTOMY Bilateral 12/16/2017   Procedure: THYROIDECTOMY;  Surgeon: Vernie Murders, MD;  Location: [facility] ORS;  Service: ENT;  Laterality: Bilateral;   TONSILLECTOMY     TOTAL ABDOMINAL HYSTERECTOMY     Ovaries Remain.   TUBAL LIGATION      Social History   Tobacco Use   Smoking status: Never   Smokeless tobacco: Never  Vaping Use   Vaping status: Never Used  Substance Use Topics   Alcohol use: Yes    Alcohol/week: 0.0 - 1.0 standard drinks of alcohol    Comment: on occassion   Drug use: Not Currently    Types: Marijuana     Medication list has been reviewed and updated.  Current Meds  Medication Sig   albuterol (VENTOLIN HFA) 108 (90 Base) MCG/ACT inhaler Inhale 2 puffs into the lungs every 6 (six) hours as needed for wheezing or shortness of breath.   alendronate (FOSAMAX) 70 MG tablet Take 70 mg by mouth once a week.   busPIRone (BUSPAR) 10 MG tablet Take 1 tablet (10 mg total) by mouth 2 (two) times daily.   calcium citrate-vitamin D (CITRACAL+D) 315-200 MG-UNIT tablet Take 2 tablets by mouth 2 (two) times daily.   carbamazepine (CARBATROL) 200 MG 12 hr capsule Take by mouth 2 (two) times daily.   escitalopram (LEXAPRO) 20 MG tablet Take 1 tablet (20 mg total) by mouth daily.   estradiol (ESTRACE) 0.1 MG/GM vaginal cream Place vaginally.   fluticasone  (FLONASE) 50 MCG/ACT nasal spray Place 2 sprays into both nostrils daily.   levothyroxine (SYNTHROID) 175 MCG tablet Take 175 mcg by mouth daily.   lisinopril (ZESTRIL) 10 MG tablet Take 10 mg by mouth daily.   metFORMIN (GLUCOPHAGE-XR) 500 MG 24 hr tablet Take 500 mg by mouth 2 (two) times daily. 2 in AM 2 in PM   OZEMPIC, 0.25 OR 0.5 MG/DOSE, 2 MG/3ML SOPN Inject 0.25 mLs into the skin once a week. Endocrinology   pantoprazole (PROTONIX) 40 MG tablet TAKE 1 TABLET BY MOUTH TWICE A DAY   pregabalin (LYRICA) 150 MG capsule Take 150 mg by mouth 2 (two) times daily.   solifenacin (VESICARE) 10 MG tablet Take 10 mg by mouth daily.   tiZANidine (ZANAFLEX) 4 MG tablet TAKE (1) TABLET BY MOUTH DAILY AS NEEDEDFOR MUSCLE SPASMS.   zaleplon (SONATA) 10 MG capsule  TAKE (1) CAPSULE BY MOUTH EVERY NIGHT AT BEDTIME AS NEEDED FOR SLEEP   [DISCONTINUED] atorvastatin (LIPITOR) 10 MG tablet TAKE (1) TABLET BY MOUTH EVERY DAY       12/04/2022   11:15 AM 11/21/2022    1:37 PM 07/31/2022    1:25 PM 06/08/2022    1:49 PM  GAD 7 : Generalized Anxiety Score  Nervous, Anxious, on Edge 2 0 0 0  Control/stop worrying 0 0 0 0  Worry too much - different things 0 0 0 0  Trouble relaxing 0 0 0 0  Restless 0 1 0 0  Easily annoyed or irritable 0 0 0 0  Afraid - awful might happen 0 0 0 0  Total GAD 7 Score 2 1 0 0  Anxiety Difficulty Not difficult at all Not difficult at all Not difficult at all Not difficult at all       12/04/2022   11:14 AM 11/21/2022    1:36 PM 07/31/2022    1:25 PM  Depression screen PHQ 2/9  Decreased Interest 1 0 1  Down, Depressed, Hopeless 0 0 0  PHQ - 2 Score 1 0 1  Altered sleeping 0 0 1  Tired, decreased energy 2 2 1   Change in appetite 0 0 0  Feeling bad or failure about yourself  0 0 0  Trouble concentrating 0 1 1  Moving slowly or fidgety/restless 0 0 0  Suicidal thoughts 0 0 0  PHQ-9 Score 3 3 4   Difficult doing work/chores Not difficult at all Somewhat difficult Not  difficult at all    BP Readings from Last 3 Encounters:  11/21/22 126/74  07/31/22 118/74  06/08/22 122/70    Physical Exam Constitutional:      Appearance: Normal appearance.  Pulmonary:     Effort: Pulmonary effort is normal.  Neurological:     Mental Status: She is alert.  Psychiatric:        Attention and Perception: Attention normal.        Mood and Affect: Mood is depressed.        Speech: Speech normal.        Cognition and Memory: Cognition normal.     Wt Readings from Last 3 Encounters:  11/21/22 205 lb (93 kg)  07/31/22 204 lb (92.5 kg)  06/08/22 208 lb (94.3 kg)    Ht 5\' 6"  (1.676 m)   BMI 33.09 kg/m   Assessment and Plan:  Problem List Items Addressed This Visit       Unprioritized   Hyperlipidemia associated with type 2 diabetes mellitus (HCC)    Last LDL was much higher despite lipitor 10 mg. Will increase dose to 20 mg daily.      Relevant Medications   atorvastatin (LIPITOR) 20 MG tablet   Mood disorder (HCC) - Primary    Having more anxiety lately - husband has upcoming cervical spine surgery; daughter and son with health issues She is not having panic attacks but is tearful at times and feeling her mind racings Will continue Lexapro Add Buspar - titrate up to 10 mg bid.      Relevant Medications   busPIRone (BUSPAR) 10 MG tablet   I spent 15 minutes on this encounter, 100% by video. No follow-ups on file.    Reubin Milan, MD [facility] [place]

## 2022-12-04 NOTE — Assessment & Plan Note (Signed)
Last LDL was much higher despite lipitor 10 mg. Will increase dose to 20 mg daily.

## 2022-12-14 ENCOUNTER — Ambulatory Visit: Payer: BLUE CROSS/BLUE SHIELD | Admitting: Internal Medicine

## 2023-01-03 DIAGNOSIS — L910 Hypertrophic scar: Secondary | ICD-10-CM | POA: Diagnosis not present

## 2023-01-29 ENCOUNTER — Encounter: Payer: Self-pay | Admitting: Internal Medicine

## 2023-02-14 ENCOUNTER — Other Ambulatory Visit: Payer: Self-pay | Admitting: Internal Medicine

## 2023-02-14 DIAGNOSIS — Z8711 Personal history of peptic ulcer disease: Secondary | ICD-10-CM

## 2023-02-14 NOTE — Telephone Encounter (Signed)
Requested Prescriptions  Pending Prescriptions Disp Refills   pantoprazole (PROTONIX) 40 MG tablet [Pharmacy Med Name: PANTOPRAZOLE SOD DR 40 MG TAB] 180 tablet 0    Sig: TAKE 1 TABLET BY MOUTH TWICE A DAY     Gastroenterology: Proton Pump Inhibitors Passed - 02/14/2023 10:14 AM      Passed - Valid encounter within last 12 months    Recent Outpatient Visits           2 months ago Mood disorder [facility])   [place] [facility] at [facility], Nyoka Cowden, MD   2 months ago Type II diabetes mellitus with complication [facility])   [place] [facility] Medicine at [facility], Nyoka Cowden, MD   6 months ago Onychomycosis   [facility] at [facility], Nyoka Cowden, MD   8 months ago Protracted bacterial bronchitis [facility])   [place] [facility] Medicine at [facility], Nyoka Cowden, MD   9 months ago Environmental and seasonal allergies   [place] [facility] at [facility], Nyoka Cowden, MD       Future Appointments             In 2 months Judithann Graves, Nyoka Cowden, MD [facility] Medicine at [facility]

## 2023-02-21 ENCOUNTER — Encounter: Payer: Self-pay | Admitting: Internal Medicine

## 2023-02-28 ENCOUNTER — Telehealth: Payer: Self-pay | Admitting: Family Medicine

## 2023-03-01 ENCOUNTER — Telehealth: Payer: Medicare HMO | Admitting: Family Medicine

## 2023-03-01 ENCOUNTER — Other Ambulatory Visit: Payer: Self-pay | Admitting: Internal Medicine

## 2023-03-01 ENCOUNTER — Encounter: Payer: Self-pay | Admitting: Family Medicine

## 2023-03-01 DIAGNOSIS — R058 Other specified cough: Secondary | ICD-10-CM | POA: Insufficient documentation

## 2023-03-01 DIAGNOSIS — Z85850 Personal history of malignant neoplasm of thyroid: Secondary | ICD-10-CM | POA: Diagnosis not present

## 2023-03-01 DIAGNOSIS — E119 Type 2 diabetes mellitus without complications: Secondary | ICD-10-CM | POA: Diagnosis not present

## 2023-03-01 DIAGNOSIS — F39 Unspecified mood [affective] disorder: Secondary | ICD-10-CM

## 2023-03-01 DIAGNOSIS — Z7952 Long term (current) use of systemic steroids: Secondary | ICD-10-CM | POA: Diagnosis not present

## 2023-03-01 DIAGNOSIS — C73 Malignant neoplasm of thyroid gland: Secondary | ICD-10-CM | POA: Diagnosis not present

## 2023-03-01 DIAGNOSIS — M8580 Other specified disorders of bone density and structure, unspecified site: Secondary | ICD-10-CM | POA: Diagnosis not present

## 2023-03-01 DIAGNOSIS — E890 Postprocedural hypothyroidism: Secondary | ICD-10-CM | POA: Diagnosis not present

## 2023-03-01 MED ORDER — PROMETHAZINE-DM 6.25-15 MG/5ML PO SYRP: 5.0000 mL | ORAL_SOLUTION | Freq: Four times a day (QID) | ORAL | 0 refills | Status: AC | PRN

## 2023-03-01 NOTE — Assessment & Plan Note (Signed)
 History of Present Illness [facility] Gabrielle Tucker is a 63 year old female who presents with persistent cough and congestion following flu-like symptoms.  Symptoms began last Monday with fever and severe headache, she initially thought to be the flu, accompanied by muscle aches and pains.  By the end of the week, she developed a cough and congestion, which have persisted. Symptoms improved by Friday but worsened again on Saturday with the onset of the cough and a general feeling of malaise. No recurrent fevers have been noted since the initial onset last Monday.  She describes her breathing as good, without chest tightness, although she feels congestion in her sinuses and chest. She frequently feels the need to clear her throat due to congestion. Her cough worsens when she lies down at night.  She has been using Mucinex  and Robitussin DM to manage her symptoms. She also uses a sinus rinse and has previously been prescribed a cough syrup containing dextromethorphan and Phenergan , which she has run out of but has been helpful in the past.  Assessment and Plan Post-Influenza Syndrome Persistent cough and sinus congestion following a recent flu-like illness. No recurrent fever or respiratory distress. -Continue Mucinex . -New Rx for dextromethorphan with Phenergan  to be dosed as needed, particularly at night. -Start Flonase , two sprays in each nostril daily for several days. -Consider saline spray as needed to promote sinus drainage.  Follow-up No formal follow-up scheduled. Patient advised to contact the office if symptoms worsen or do not improve by the middle to end of next week.

## 2023-03-01 NOTE — Telephone Encounter (Signed)
 Requested Prescriptions  Pending Prescriptions Disp Refills   escitalopram  (LEXAPRO ) 20 MG tablet [Pharmacy Med Name: ESCITALOPRAM  20 MG TABLET] 90 tablet 0    Sig: TAKE 1 TABLET BY MOUTH EVERY DAY     Psychiatry:  Antidepressants - SSRI Passed - 03/01/2023  2:22 PM      Passed - Valid encounter within last 6 months    Recent Outpatient Visits           2 months ago Mood disorder [facility])   [place] [facility] at [facility], Leita DEL, MD   3 months ago Type II diabetes mellitus with complication [facility])   [place] [facility] Medicine at [facility], Leita DEL, MD   7 months ago Onychomycosis   [place] [facility] at [facility], Leita DEL, MD   8 months ago Protracted bacterial bronchitis [facility])   [place] [facility] Medicine at [facility], Leita DEL, MD   10 months ago Environmental and seasonal allergies   [place] [facility] Medicine at [facility], Leita DEL, MD       Future Appointments             In 2 months [name], MD [facility] Medicine at [facility] [place], [facility]             busPIRone  (BUSPAR ) 10 MG tablet [Pharmacy Med Name: BUSPIRONE  HCL 10 MG TABLET] 180 tablet 0    Sig: TAKE 1 TABLET BY MOUTH TWICE A DAY     Psychiatry: Anxiolytics/Hypnotics - Non-controlled Passed - 03/01/2023  2:22 PM      Passed - Valid encounter within last 12 months    Recent Outpatient Visits           2 months ago Mood disorder [facility])   [place] [facility] at [facility], Leita DEL, MD   3 months ago Type II diabetes mellitus with complication [facility])   [place] [facility] Medicine at [facility], Leita DEL, MD   7 months ago Onychomycosis   [facility] Primary [facility] & [facility] Medicine at [facility], Leita DEL, MD   8 months ago Protracted  bacterial bronchitis [facility])   [place] [facility] Sports Medicine at [facility], Leita DEL, MD   10 months ago Environmental and seasonal allergies   [facility] at [facility], Leita DEL, MD       Future Appointments             In 2 months Justus, Leita DEL, MD [facility] Primary Care & Sports Medicine at [facility], [facility]

## 2023-03-01 NOTE — Progress Notes (Signed)
 Primary Care / Sports Medicine Virtual Visit  Patient Information:  Patient ID: Gabrielle Tucker, female DOB: 1960/09/03 Age: 63 y.o. MRN: [id number]   Gabrielle Tucker is a pleasant 63 y.o. female presenting with the following:  Chief Complaint  Patient presents with   Nasal Congestion    Nasal congestion x 1 week Cough x 1 week Patient believed to have flu last week, but did not come in to be test.       Review of Systems: No fevers, chills, night sweats, weight loss, chest pain, or shortness of breath.   Patient Active Problem List   Diagnosis Date Noted   Post-viral cough syndrome 03/01/2023   Low bone mass 02/13/2022   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 07/19/2021   Post-surgical hypothyroidism 03/22/2021   Osteopenia of hip 09/05/2020   On corticosteroid therapy 08/05/2020   Status post hysterectomy 03/01/2020   Type II diabetes mellitus with complication (HCC) 01/07/2019   Mood disorder (HCC) 01/07/2019   S/P total thyroidectomy 12/16/2017   Multiple thyroid  nodules 11/12/2017   Hyperlipidemia associated with type 2 diabetes mellitus (HCC) 08/03/2016   Arthritis of knee, left 08/02/2016   Back muscle spasm 07/03/2014   H/O peptic ulcer 07/03/2014   Migraine without aura and responsive to treatment 07/03/2014   Muscle contraction headache 07/03/2014   Idiopathic insomnia 07/03/2014   Tachycardia 07/03/2014   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   atorvastatin  (LIPITOR) 20 MG tablet TAKE (1) TABLET BY MOUTH EVERY DAY   busPIRone  (BUSPAR ) 10 MG tablet Take 1 tablet (10 mg total) by  mouth 2 (two) times daily.   escitalopram  (LEXAPRO ) 20 MG tablet Take 1 tablet (20 mg total) by mouth daily.   estradiol (ESTRACE) 0.1 MG/GM vaginal cream Place vaginally.   fluticasone  (FLONASE ) 50 MCG/ACT nasal spray Place 2 sprays into both nostrils daily.   levothyroxine (SYNTHROID) 175 MCG tablet Take 175 mcg by mouth daily.   lisinopril  (ZESTRIL ) 10 MG tablet Take 10 mg by mouth daily.   metFORMIN (GLUCOPHAGE-XR) 500 MG 24 hr tablet Take 500 mg by mouth 2 (two) times daily. 2 in AM 2 in PM   OZEMPIC, 0.25 OR 0.5 MG/DOSE, 2 MG/3ML SOPN Inject 0.25 mLs into the skin once a week. Endocrinology   pantoprazole  (PROTONIX ) 40 MG tablet TAKE 1 TABLET BY MOUTH TWICE A DAY   pregabalin  (LYRICA ) 150 MG capsule Take 150 mg by mouth 2 (two) times daily.   tiZANidine  (ZANAFLEX ) 4 MG tablet TAKE (1) TABLET BY MOUTH DAILY AS NEEDEDFOR MUSCLE SPASMS.   zaleplon  (SONATA ) 10 MG capsule TAKE (1) CAPSULE BY MOUTH EVERY NIGHT AT BEDTIME AS NEEDED FOR SLEEP   albuterol  (VENTOLIN  HFA) 108 (90 Base) MCG/ACT inhaler Inhale 2 puffs into the lungs every 6 (six) hours as needed for wheezing or shortness of breath. (Patient not taking: Reported on 03/01/2023)   calcium  citrate-vitamin D  (CITRACAL+D) 315-200 MG-UNIT tablet Take 2 tablets by mouth 2 (two) times daily.   promethazine -dextromethorphan (PROMETHAZINE -DM) 6.25-15 MG/5ML syrup Take 5 mLs by mouth 4 (  four) times daily as needed for up to 9 days for cough.   solifenacin (VESICARE) 10 MG tablet Take 10 mg by mouth daily. (Patient not taking: Reported on 03/01/2023)   [DISCONTINUED] carbamazepine (CARBATROL) 200 MG 12 hr capsule Take by mouth 2 (two) times daily.   No facility-administered encounter medications on file as of 03/01/2023.   Past Surgical History:  Procedure Laterality Date   ANTERIOR FUSION CERVICAL SPINE  2006   BREAST REDUCTION SURGERY     CHOLECYSTECTOMY  05/05/2019   COLONOSCOPY WITH PROPOFOL  N/A 08/24/2020   Procedure: COLONOSCOPY WITH PROPOFOL ;   Surgeon: Toledo, Ladell POUR, MD;  Location: [facility];  Service: Gastroenterology;  Laterality: N/A;  DM   ESOPHAGOGASTRODUODENOSCOPY  2011   gastric ulcer   ESOPHAGOGASTRODUODENOSCOPY (EGD) WITH PROPOFOL  N/A 08/24/2020   Procedure: ESOPHAGOGASTRODUODENOSCOPY (EGD) WITH PROPOFOL ;  Surgeon: Toledo, Ladell POUR, MD;  Location: [facility];  Service: Gastroenterology;  Laterality: N/A;   LITHOTRIPSY  2012   with setnet   LUMBAR DISC SURGERY     NASAL SINUS SURGERY  2015   partail knee replacement Bilateral    REDUCTION MAMMAPLASTY Bilateral 2011   REPAIR TENDONS FOOT Left    THYROIDECTOMY Bilateral 12/16/2017   Procedure: THYROIDECTOMY;  Surgeon: Edda Mt, MD;  Location: [facility];  Service: ENT;  Laterality: Bilateral;   TONSILLECTOMY     TOTAL ABDOMINAL HYSTERECTOMY     Ovaries Remain.   TUBAL LIGATION      Virtual Visit via MyChart Video:   I connected with Ronal Landry Niece on 03/01/23 via MyChart Video and verified that I am speaking with the correct person using appropriate identifiers.   The limitations, risks, security and privacy concerns of performing an evaluation and management service by MyChart Video, including the higher likelihood of inaccurate diagnoses and treatments, and the availability of in person appointments were reviewed. The possible need of an additional face-to-face encounter for complete and high quality delivery of care was discussed. The patient was also made aware that there may be a patient responsible charge related to this service. The patient expressed understanding and wishes to proceed.  Provider location is in medical facility. Patient location is at their home, different from provider location. People involved in care of the patient during this telehealth encounter were myself, my nurse/medical assistant, and my front office/scheduling team member.  Objective findings:   General: Speaking full sentences, no audible heavy breathing.  Sounds alert and appropriately interactive. Well-appearing. Face symmetric. Extraocular movements intact. Pupils equal and round. No nasal flaring or accessory muscle use visualized.  Independent interpretation of notes and tests performed by another provider:   None  Pertinent History, Exam, Impression, and Recommendations:   Problem List Items Addressed This Visit     Post-viral cough syndrome - Primary   History of Present Illness Carline Dura Nakeisha Greenhouse is a 62 year old female who presents with persistent cough and congestion following flu-like symptoms.  Symptoms began last Monday with fever and severe headache, she initially thought to be the flu, accompanied by muscle aches and pains.  By the end of the week, she developed a cough and congestion, which have persisted. Symptoms improved by Friday but worsened again on Saturday with the onset of the cough and a general feeling of malaise. No recurrent fevers have been noted since the initial onset last Monday.  She describes her breathing as good, without chest tightness, although she feels congestion in her sinuses and chest. She frequently feels the  need to clear her throat due to congestion. Her cough worsens when she lies down at night.  She has been using Mucinex  and Robitussin DM to manage her symptoms. She also uses a sinus rinse and has previously been prescribed a cough syrup containing dextromethorphan and Phenergan , which she has run out of but has been helpful in the past.  Assessment and Plan Post-Influenza Syndrome Persistent cough and sinus congestion following a recent flu-like illness. No recurrent fever or respiratory distress. -Continue Mucinex . -New Rx for dextromethorphan with Phenergan  to be dosed as needed, particularly at night. -Start Flonase , two sprays in each nostril daily for several days. -Consider saline spray as needed to promote sinus drainage.  Follow-up No formal follow-up scheduled.  Patient advised to contact the office if symptoms worsen or do not improve by the middle to end of next week.      Relevant Medications   promethazine -dextromethorphan (PROMETHAZINE -DM) 6.25-15 MG/5ML syrup     Orders & Medications Medications:  Meds ordered this encounter  Medications   promethazine -dextromethorphan (PROMETHAZINE -DM) 6.25-15 MG/5ML syrup    Sig: Take 5 mLs by mouth 4 (four) times daily as needed for up to 9 days for cough.    Dispense:  118 mL    Refill:  0   No orders of the defined types were placed in this encounter.    I discussed the above assessment and treatment plan with the patient. The patient was provided an opportunity to ask questions and all were answered. The patient agreed with the plan and demonstrated an understanding of the instructions.   The patient was advised to call back or seek an in-person evaluation if the symptoms worsen or if the condition fails to improve as anticipated.   I provided a total time of 30 minutes including both face-to-face and non-face-to-face time on 03/01/2023 inclusive of time utilized for medical chart review, information gathering, care coordination with staff, and documentation completion.    Selinda JINNY Ku, MD, [facility]   Primary Care Sports Medicine Primary Care and Sports Medicine at [facility] [place]

## 2023-03-01 NOTE — Patient Instructions (Signed)
 Post-Influenza Syndrome Management Plan  1. Current Symptoms:    - Persistent cough    - Sinus congestion    - No fever or respiratory distress  2. Medications:    - Continue Mucinex  as directed.    - (Re)Start prescription: Dextromethorphan with Phenergan  cough syrup, to be taken as needed, especially at night.    - Begin using Flonase : Two sprays in each nostril daily for several days.  3. Additional Measures:    - Use saline spray as needed to help with sinus drainage.  4. Follow-Up Advice:    - No formal follow-up appointment is scheduled.    - Contact the office if symptoms worsen or do not improve by the middle to end of next week.

## 2023-03-04 ENCOUNTER — Encounter: Payer: Self-pay | Admitting: Internal Medicine

## 2023-03-04 DIAGNOSIS — K592 Neurogenic bowel, not elsewhere classified: Secondary | ICD-10-CM | POA: Diagnosis not present

## 2023-03-04 DIAGNOSIS — R419 Unspecified symptoms and signs involving cognitive functions and awareness: Secondary | ICD-10-CM | POA: Diagnosis not present

## 2023-03-04 DIAGNOSIS — N319 Neuromuscular dysfunction of bladder, unspecified: Secondary | ICD-10-CM | POA: Diagnosis not present

## 2023-03-04 DIAGNOSIS — Z5181 Encounter for therapeutic drug level monitoring: Secondary | ICD-10-CM | POA: Diagnosis not present

## 2023-03-04 DIAGNOSIS — M792 Neuralgia and neuritis, unspecified: Secondary | ICD-10-CM | POA: Diagnosis not present

## 2023-03-04 LAB — HEMOGLOBIN A1C: Hemoglobin A1C: 5.3

## 2023-03-04 LAB — TSH: TSH: 2.44 (ref 0.41–5.90)

## 2023-03-06 ENCOUNTER — Ambulatory Visit: Payer: Medicare HMO | Admitting: Internal Medicine

## 2023-03-06 ENCOUNTER — Encounter: Payer: Self-pay | Admitting: Internal Medicine

## 2023-03-06 ENCOUNTER — Other Ambulatory Visit: Payer: Self-pay | Admitting: Internal Medicine

## 2023-03-06 VITALS — BP 134/78 | HR 101 | Ht 66.0 in | Wt 214.2 lb

## 2023-03-06 DIAGNOSIS — R000 Tachycardia, unspecified: Secondary | ICD-10-CM

## 2023-03-06 DIAGNOSIS — E785 Hyperlipidemia, unspecified: Secondary | ICD-10-CM

## 2023-03-06 DIAGNOSIS — I10 Essential (primary) hypertension: Secondary | ICD-10-CM | POA: Diagnosis not present

## 2023-03-06 DIAGNOSIS — E1169 Type 2 diabetes mellitus with other specified complication: Secondary | ICD-10-CM

## 2023-03-06 MED ORDER — METOPROLOL SUCCINATE ER 50 MG PO TB24: ORAL_TABLET | ORAL | 1 refills | Status: DC

## 2023-03-06 NOTE — Patient Instructions (Signed)
Restart Metoprolol.  Aim for BP < 130/80  and HR < 80

## 2023-03-06 NOTE — Assessment & Plan Note (Addendum)
Has not been on medications for the past year due to renal issues However, these have resolved.  BP elevated yesterday and HR remains high daily. Will resume Metoprolol daily.  Avoid sudafed and similar decongestants Recheck in 2 months.

## 2023-03-06 NOTE — Progress Notes (Signed)
Date:  03/06/2023   Name:  Gabrielle Tucker   DOB:  07-29-1960   MRN:  [id number]   Chief Complaint: Hypertension (Patient had a appt with her neurologist on monday 03/04/23. Her B/P was 163/102 it came down to 157/90 and they let her finally go home. She was told to follow up with her PCP.)  Hypertension This is a chronic problem. The problem is uncontrolled (at neurologist after taking sudafed). Pertinent negatives include no chest pain, headaches or shortness of breath. Treatments tried: previously on lisinopril, hctz and metoprolol but stopped by nephrology. There is no history of kidney disease, CAD/MI or CVA.     Review of Systems  Constitutional:  Negative for chills, fatigue and fever.  Respiratory:  Negative for chest tightness and shortness of breath.   Cardiovascular:  Negative for chest pain and leg swelling.  Neurological:  Negative for dizziness and headaches.     Lab Results  Component Value Date   NA 140 11/21/2022   K 4.2 11/21/2022   CO2 24 11/21/2022   GLUCOSE 83 11/21/2022   BUN 14 11/21/2022   CREATININE 0.95 11/21/2022   CALCIUM 9.1 11/21/2022   EGFR 68 11/21/2022   GFRNONAA 92 03/18/2019   Lab Results  Component Value Date   CHOL 223 (H) 11/21/2022   HDL 55 11/21/2022   LDLCALC 121 (H) 11/21/2022   TRIG 269 (H) 11/21/2022   CHOLHDL 4.1 11/21/2022   Lab Results  Component Value Date   TSH 0.17 (A) 08/17/2022   Lab Results  Component Value Date   HGBA1C 5.3 03/04/2023   Lab Results  Component Value Date   WBC 11.5 04/27/2021   HGB 12.7 04/27/2021   HCT 37 04/27/2021   MCV 92 01/07/2019   PLT 221 04/27/2021   Lab Results  Component Value Date   ALT 10 11/21/2022   AST 16 11/21/2022   ALKPHOS 142 (H) 11/21/2022   BILITOT <0.2 11/21/2022   No results found for: "25OHVITD2", "25OHVITD3", "VD25OH"   Patient Active Problem List   Diagnosis Date Noted   Essential hypertension 03/06/2023   Post-viral cough syndrome 03/01/2023    Low bone mass 02/13/2022   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 07/19/2021   Post-surgical hypothyroidism 03/22/2021   Osteopenia of hip 09/05/2020   On corticosteroid therapy 08/05/2020   Status post hysterectomy 03/01/2020   Type II diabetes mellitus with complication (HCC) 01/07/2019   Mood disorder (HCC) 01/07/2019   S/P total thyroidectomy 12/16/2017   Multiple thyroid nodules 11/12/2017   Hyperlipidemia associated with type 2 diabetes mellitus (HCC) 08/03/2016   Arthritis of knee, left 08/02/2016   Back muscle spasm 07/03/2014   H/O peptic ulcer 07/03/2014   Migraine without aura and responsive to treatment 07/03/2014   Muscle contraction headache 07/03/2014   Idiopathic insomnia 07/03/2014   Tachycardia 07/03/2014    Allergies  Allergen Reactions   Cefdinir Diarrhea   Morphine And Codeine Nausea And Vomiting   Tape Hives and Rash    No [paper tape.  Tega derm OK    Past Surgical History:  Procedure Laterality Date   ANTERIOR FUSION CERVICAL SPINE  2006   BREAST REDUCTION SURGERY     CHOLECYSTECTOMY  05/05/2019   COLONOSCOPY WITH PROPOFOL N/A 08/24/2020   Procedure: COLONOSCOPY WITH PROPOFOL;  Surgeon: Toledo, Boykin Nearing, MD;  Location: [facility];  Service: Gastroenterology;  Laterality: N/A;  DM   ESOPHAGOGASTRODUODENOSCOPY  2011   gastric ulcer   ESOPHAGOGASTRODUODENOSCOPY (EGD) WITH PROPOFOL N/A 08/24/2020  Procedure: ESOPHAGOGASTRODUODENOSCOPY (EGD) WITH PROPOFOL;  Surgeon: Toledo, Boykin Nearing, MD;  Location: [facility];  Service: Gastroenterology;  Laterality: N/A;   LITHOTRIPSY  2012   with setnet   LUMBAR DISC SURGERY     NASAL SINUS SURGERY  2015   partail knee replacement Bilateral    REDUCTION MAMMAPLASTY Bilateral 2011   REPAIR TENDONS FOOT Left    THYROIDECTOMY Bilateral 12/16/2017   Procedure: THYROIDECTOMY;  Surgeon: Vernie Murders, MD;  Location: [facility];  Service: ENT;  Laterality: Bilateral;   TONSILLECTOMY     TOTAL ABDOMINAL HYSTERECTOMY      Ovaries Remain.   TUBAL LIGATION      Social History   Tobacco Use   Smoking status: Never   Smokeless tobacco: Never  Vaping Use   Vaping status: Never Used  Substance Use Topics   Alcohol use: Yes    Alcohol/week: 0.0 - 1.0 standard drinks of alcohol    Comment: on occassion   Drug use: Not Currently    Types: Marijuana     Medication list has been reviewed and updated.  No outpatient medications have been marked as taking for the 03/06/23 encounter (Office Visit) with Reubin Milan, MD.       03/06/2023    1:40 PM 12/04/2022   11:15 AM 11/21/2022    1:37 PM 07/31/2022    1:25 PM  GAD 7 : Generalized Anxiety Score  Nervous, Anxious, on Edge 0 2 0 0  Control/stop worrying 0 0 0 0  Worry too much - different things 0 0 0 0  Trouble relaxing 0 0 0 0  Restless 0 0 1 0  Easily annoyed or irritable 0 0 0 0  Afraid - awful might happen 0 0 0 0  Total GAD 7 Score 0 2 1 0  Anxiety Difficulty Not difficult at all Not difficult at all Not difficult at all Not difficult at all       03/06/2023    1:40 PM 12/04/2022   11:14 AM 11/21/2022    1:36 PM  Depression screen PHQ 2/9  Decreased Interest 0 1 0  Down, Depressed, Hopeless 0 0 0  PHQ - 2 Score 0 1 0  Altered sleeping 0 0 0  Tired, decreased energy 0 2 2  Change in appetite 0 0 0  Feeling bad or failure about yourself  0 0 0  Trouble concentrating 1 0 1  Moving slowly or fidgety/restless 0 0 0  Suicidal thoughts 0 0 0  PHQ-9 Score 1 3 3   Difficult doing work/chores Not difficult at all Not difficult at all Somewhat difficult    BP Readings from Last 3 Encounters:  03/06/23 134/78  11/21/22 126/74  07/31/22 118/74    Physical Exam Vitals and nursing note reviewed.  Constitutional:      General: She is not in acute distress.    Appearance: Normal appearance. She is well-developed.  HENT:     Head: Normocephalic and atraumatic.  Cardiovascular:     Rate and Rhythm: Regular rhythm. Tachycardia present.   Pulmonary:     Effort: Pulmonary effort is normal. No respiratory distress.     Breath sounds: No wheezing or rhonchi.  Musculoskeletal:     Cervical back: Normal range of motion.     Right lower leg: No edema.     Left lower leg: No edema.  Skin:    General: Skin is warm and dry.     Findings: No rash.  Neurological:  Mental Status: She is alert and oriented to person, place, and time.  Psychiatric:        Mood and Affect: Mood normal.        Behavior: Behavior normal.     Wt Readings from Last 3 Encounters:  03/06/23 214 lb 3.2 oz (97.2 kg)  11/21/22 205 lb (93 kg)  07/31/22 204 lb (92.5 kg)    BP 134/78   Pulse (!) 101   Ht 5\' 6"  (1.676 m)   Wt 214 lb 3.2 oz (97.2 kg)   SpO2 97%   BMI 34.57 kg/m   Assessment and Plan:  Problem List Items Addressed This Visit       Unprioritized   Tachycardia (Chronic)   Relevant Medications   metoprolol succinate (TOPROL-XL) 50 MG 24 hr tablet   Hyperlipidemia associated with type 2 diabetes mellitus (HCC)   Relevant Medications   metoprolol succinate (TOPROL-XL) 50 MG 24 hr tablet   Essential hypertension - Primary   Has not been on medications for the past year due to renal issues However, these have resolved.  BP elevated yesterday and HR remains high daily. Will resume Metoprolol daily.  Avoid sudafed and similar decongestants Recheck in 2 months.      Relevant Medications   metoprolol succinate (TOPROL-XL) 50 MG 24 hr tablet    No follow-ups on file.    Reubin Milan, MD [facility] Primary Care and Sports Medicine [place]

## 2023-03-06 NOTE — Progress Notes (Unsigned)
Date:  03/06/2023   Name:  Gabrielle Tucker   DOB:  1960-03-02   MRN:  [id number]   Chief Complaint: No chief complaint on file.  HPI  Review of Systems   Lab Results  Component Value Date   NA 140 11/21/2022   K 4.2 11/21/2022   CO2 24 11/21/2022   GLUCOSE 83 11/21/2022   BUN 14 11/21/2022   CREATININE 0.95 11/21/2022   CALCIUM 9.1 11/21/2022   EGFR 68 11/21/2022   GFRNONAA 92 03/18/2019   Lab Results  Component Value Date   CHOL 223 (H) 11/21/2022   HDL 55 11/21/2022   LDLCALC 121 (H) 11/21/2022   TRIG 269 (H) 11/21/2022   CHOLHDL 4.1 11/21/2022   Lab Results  Component Value Date   TSH 0.17 (A) 08/17/2022   Lab Results  Component Value Date   HGBA1C 5.6 11/21/2022   Lab Results  Component Value Date   WBC 11.5 04/27/2021   HGB 12.7 04/27/2021   HCT 37 04/27/2021   MCV 92 01/07/2019   PLT 221 04/27/2021   Lab Results  Component Value Date   ALT 10 11/21/2022   AST 16 11/21/2022   ALKPHOS 142 (H) 11/21/2022   BILITOT <0.2 11/21/2022   No results found for: "25OHVITD2", "25OHVITD3", "VD25OH"   Patient Active Problem List   Diagnosis Date Noted   Post-viral cough syndrome 03/01/2023   Low bone mass 02/13/2022   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 07/19/2021   Post-surgical hypothyroidism 03/22/2021   Osteopenia of hip 09/05/2020   On corticosteroid therapy 08/05/2020   Status post hysterectomy 03/01/2020   Type II diabetes mellitus with complication (HCC) 01/07/2019   Mood disorder (HCC) 01/07/2019   S/P total thyroidectomy 12/16/2017   Multiple thyroid nodules 11/12/2017   Hyperlipidemia associated with type 2 diabetes mellitus (HCC) 08/03/2016   Arthritis of knee, left 08/02/2016   Back muscle spasm 07/03/2014   H/O peptic ulcer 07/03/2014   Migraine without aura and responsive to treatment 07/03/2014   Muscle contraction headache 07/03/2014   Idiopathic insomnia 07/03/2014   Tachycardia 07/03/2014    Allergies  Allergen Reactions    Cefdinir Diarrhea   Morphine And Codeine Nausea And Vomiting   Tape Hives and Rash    No [paper tape.  Tega derm OK    Past Surgical History:  Procedure Laterality Date   ANTERIOR FUSION CERVICAL SPINE  2006   BREAST REDUCTION SURGERY     CHOLECYSTECTOMY  05/05/2019   COLONOSCOPY WITH PROPOFOL N/A 08/24/2020   Procedure: COLONOSCOPY WITH PROPOFOL;  Surgeon: Toledo, Boykin Nearing, MD;  Location: [facility];  Service: Gastroenterology;  Laterality: N/A;  DM   ESOPHAGOGASTRODUODENOSCOPY  2011   gastric ulcer   ESOPHAGOGASTRODUODENOSCOPY (EGD) WITH PROPOFOL N/A 08/24/2020   Procedure: ESOPHAGOGASTRODUODENOSCOPY (EGD) WITH PROPOFOL;  Surgeon: Toledo, Boykin Nearing, MD;  Location: [facility];  Service: Gastroenterology;  Laterality: N/A;   LITHOTRIPSY  2012   with setnet   LUMBAR DISC SURGERY     NASAL SINUS SURGERY  2015   partail knee replacement Bilateral    REDUCTION MAMMAPLASTY Bilateral 2011   REPAIR TENDONS FOOT Left    THYROIDECTOMY Bilateral 12/16/2017   Procedure: THYROIDECTOMY;  Surgeon: Vernie Murders, MD;  Location: [facility];  Service: ENT;  Laterality: Bilateral;   TONSILLECTOMY     TOTAL ABDOMINAL HYSTERECTOMY     Ovaries Remain.   TUBAL LIGATION      Social History   Tobacco Use   Smoking status: Never  Smokeless tobacco: Never  Vaping Use   Vaping status: Never Used  Substance Use Topics   Alcohol use: Yes    Alcohol/week: 0.0 - 1.0 standard drinks of alcohol    Comment: on occassion   Drug use: Not Currently    Types: Marijuana     Medication list has been reviewed and updated.  Current Meds  Medication Sig   carbamazepine (TEGRETOL) 200 MG tablet Take 200 mg by mouth 2 (two) times daily.   OZEMPIC, 1 MG/DOSE, 4 MG/3ML SOPN Inject 1 mg into the skin once a week.   triamcinolone cream (KENALOG) 0.1 % Apply 1 Application topically 2 (two) times daily.       12/04/2022   11:15 AM 11/21/2022    1:37 PM 07/31/2022    1:25 PM 06/08/2022    1:49 PM   GAD 7 : Generalized Anxiety Score  Nervous, Anxious, on Edge 2 0 0 0  Control/stop worrying 0 0 0 0  Worry too much - different things 0 0 0 0  Trouble relaxing 0 0 0 0  Restless 0 1 0 0  Easily annoyed or irritable 0 0 0 0  Afraid - awful might happen 0 0 0 0  Total GAD 7 Score 2 1 0 0  Anxiety Difficulty Not difficult at all Not difficult at all Not difficult at all Not difficult at all       12/04/2022   11:14 AM 11/21/2022    1:36 PM 07/31/2022    1:25 PM  Depression screen PHQ 2/9  Decreased Interest 1 0 1  Down, Depressed, Hopeless 0 0 0  PHQ - 2 Score 1 0 1  Altered sleeping 0 0 1  Tired, decreased energy 2 2 1   Change in appetite 0 0 0  Feeling bad or failure about yourself  0 0 0  Trouble concentrating 0 1 1  Moving slowly or fidgety/restless 0 0 0  Suicidal thoughts 0 0 0  PHQ-9 Score 3 3 4   Difficult doing work/chores Not difficult at all Somewhat difficult Not difficult at all    BP Readings from Last 3 Encounters:  11/21/22 126/74  07/31/22 118/74  06/08/22 122/70    Physical Exam  Wt Readings from Last 3 Encounters:  11/21/22 205 lb (93 kg)  07/31/22 204 lb (92.5 kg)  06/08/22 208 lb (94.3 kg)    There were no vitals taken for this visit.  Assessment and Plan:  Problem List Items Addressed This Visit   None   No follow-ups on file.    Reubin Milan, MD [facility] Primary Care and Sports Medicine [place]

## 2023-03-14 ENCOUNTER — Encounter: Payer: Self-pay | Admitting: Internal Medicine

## 2023-03-15 ENCOUNTER — Other Ambulatory Visit: Payer: Self-pay | Admitting: Internal Medicine

## 2023-03-15 DIAGNOSIS — F5101 Primary insomnia: Secondary | ICD-10-CM

## 2023-03-15 MED ORDER — ZALEPLON 10 MG PO CAPS: ORAL_CAPSULE | ORAL | 5 refills | Status: DC

## 2023-03-15 NOTE — Telephone Encounter (Signed)
 Please review.  KP

## 2023-03-25 ENCOUNTER — Telehealth: Payer: Self-pay

## 2023-03-25 ENCOUNTER — Other Ambulatory Visit: Payer: Self-pay

## 2023-03-25 DIAGNOSIS — Z8711 Personal history of peptic ulcer disease: Secondary | ICD-10-CM

## 2023-03-25 DIAGNOSIS — F39 Unspecified mood [affective] disorder: Secondary | ICD-10-CM

## 2023-03-25 MED ORDER — BUSPIRONE HCL 10 MG PO TABS: 10.0000 mg | ORAL_TABLET | Freq: Two times a day (BID) | ORAL | 0 refills | Status: DC

## 2023-03-25 MED ORDER — PANTOPRAZOLE SODIUM 40 MG PO TBEC: 40.0000 mg | DELAYED_RELEASE_TABLET | Freq: Two times a day (BID) | ORAL | 0 refills | Status: DC

## 2023-03-25 NOTE — Telephone Encounter (Signed)
 Called and verified with patient pharmacy. She confirmed. Sent refills.  Copied from CRM [id number]. Topic: Clinical - Medication Question >> Mar 25, 2023 10:55 AM Gery Pray wrote: Reason for CRM: Pam from [facility] calling on behalf of the patient to get a 90 day prescription for pantoprazole (PROTONIX) 40 MG tablet and busPIRone (BUSPAR) 10 MG tablet. Prescription can be called in to [phone] or faxed [phone].

## 2023-03-28 ENCOUNTER — Other Ambulatory Visit: Payer: Self-pay | Admitting: Internal Medicine

## 2023-03-28 DIAGNOSIS — F39 Unspecified mood [affective] disorder: Secondary | ICD-10-CM

## 2023-03-28 NOTE — Telephone Encounter (Unsigned)
 Copied from CRM [id number]. Topic: Clinical - Medication Refill >> Mar 28, 2023 12:22 PM Shelah Lewandowsky wrote: Most Recent Primary Care Visit:  Provider: Reubin Milan  Department: [facility]  Visit Type: MYCHART VIDEO VISIT  Date: 12/04/2022  Medication: escitalopram (LEXAPRO) 20 MG tablet   Has the patient contacted their pharmacy? Yes (Agent: If no, request that the patient contact the pharmacy for the refill. If patient does not wish to contact the pharmacy document the reason why and proceed with request.) (Agent: If yes, when and what did the pharmacy advise?)  Is this the correct pharmacy for this prescription? Yes If no, delete pharmacy and type the correct one.  This is the patient's preferred pharmacy:    [facility] (Now [facility]) - [address] [place], [place] - [address] [place] [address] Phone: [phone] Fax: [phone]   Has the prescription been filled recently? Yes  Is the patient out of the medication? No  Has the patient been seen for an appointment in the last year OR does the patient have an upcoming appointment? Yes  Can we respond through MyChart? Yes  Agent: Please be advised that Rx refills may take up to 3 business days. We ask that you follow-up with your pharmacy.

## 2023-03-29 NOTE — Telephone Encounter (Signed)
 Refilled 03/01/23 # 90. Requested Prescriptions  Refused Prescriptions Disp Refills   escitalopram (LEXAPRO) 20 MG tablet 90 tablet 0    Sig: Take 1 tablet (20 mg total) by mouth daily.     Psychiatry:  Antidepressants - SSRI Passed - 03/29/2023  9:54 AM      Passed - Valid encounter within last 6 months    Recent Outpatient Visits           3 months ago Mood disorder [facility])   [place] [facility] at [facility], Nyoka Cowden, MD   4 months ago Type II diabetes mellitus with complication [place])   [place] [facility] at [facility], Nyoka Cowden, MD   8 months ago Onychomycosis   [facility] Primary Care & Sports Medicine at [facility], Nyoka Cowden, MD   9 months ago Protracted bacterial bronchitis [facility])   [place] [facility] Medicine at [facility], Nyoka Cowden, MD   11 months ago Environmental and seasonal allergies   [facility] Medicine at [facility], Nyoka Cowden, MD       Future Appointments             In 1 month Judithann Graves, Nyoka Cowden, MD [facility] Primary Care & Sports Medicine at [facility], [facility]

## 2023-04-12 ENCOUNTER — Other Ambulatory Visit: Payer: Self-pay

## 2023-04-12 DIAGNOSIS — E1169 Type 2 diabetes mellitus with other specified complication: Secondary | ICD-10-CM

## 2023-04-16 ENCOUNTER — Other Ambulatory Visit: Payer: Self-pay

## 2023-04-16 DIAGNOSIS — F39 Unspecified mood [affective] disorder: Secondary | ICD-10-CM

## 2023-04-16 DIAGNOSIS — E1169 Type 2 diabetes mellitus with other specified complication: Secondary | ICD-10-CM

## 2023-04-16 MED ORDER — ESCITALOPRAM OXALATE 20 MG PO TABS: 20.0000 mg | ORAL_TABLET | Freq: Every day | ORAL | 0 refills | Status: DC

## 2023-04-16 MED ORDER — PREGABALIN 150 MG PO CAPS: 150.0000 mg | ORAL_CAPSULE | Freq: Two times a day (BID) | ORAL | 1 refills | Status: DC

## 2023-04-16 MED ORDER — ATORVASTATIN CALCIUM 20 MG PO TABS: ORAL_TABLET | ORAL | 1 refills | Status: DC

## 2023-04-16 NOTE — Telephone Encounter (Signed)
 [facility] pharmacy is requesting a new Rx for prescriptions for patientvia fax.  Atorvastatin 10 MG TABLETS, Escitalopram 20 MG TABLETS, Pregabalin 150 MG CAPSULE

## 2023-04-26 ENCOUNTER — Other Ambulatory Visit: Payer: Self-pay

## 2023-04-26 ENCOUNTER — Encounter: Payer: Self-pay | Admitting: Internal Medicine

## 2023-04-26 DIAGNOSIS — B379 Candidiasis, unspecified: Secondary | ICD-10-CM

## 2023-04-26 MED ORDER — FLUCONAZOLE 150 MG PO TABS: 150.0000 mg | ORAL_TABLET | Freq: Once | ORAL | 0 refills | Status: AC

## 2023-04-30 ENCOUNTER — Ambulatory Visit: Payer: Self-pay | Admitting: Internal Medicine

## 2023-05-13 ENCOUNTER — Encounter: Payer: Self-pay | Admitting: Internal Medicine

## 2023-05-13 ENCOUNTER — Ambulatory Visit: Admitting: Internal Medicine

## 2023-05-13 DIAGNOSIS — N319 Neuromuscular dysfunction of bladder, unspecified: Secondary | ICD-10-CM | POA: Insufficient documentation

## 2023-05-13 DIAGNOSIS — M792 Neuralgia and neuritis, unspecified: Secondary | ICD-10-CM | POA: Insufficient documentation

## 2023-05-13 DIAGNOSIS — K592 Neurogenic bowel, not elsewhere classified: Secondary | ICD-10-CM | POA: Insufficient documentation

## 2023-05-17 ENCOUNTER — Other Ambulatory Visit: Payer: Self-pay | Admitting: Internal Medicine

## 2023-05-17 DIAGNOSIS — Z8711 Personal history of peptic ulcer disease: Secondary | ICD-10-CM

## 2023-05-17 NOTE — Telephone Encounter (Signed)
 Requested Prescriptions  Refused Prescriptions Disp Refills   pantoprazole  (PROTONIX ) 40 MG tablet [Pharmacy Med Name: PANTOPRAZOLE  SOD DR 40 MG TAB] 180 tablet 0    Sig: TAKE 1 TABLET BY MOUTH TWICE A DAY     Gastroenterology: Proton Pump Inhibitors Passed - 05/17/2023  1:56 PM      Passed - Valid encounter within last 12 months    Recent Outpatient Visits           2 months ago Essential hypertension   [employer] Primary Care & Sports Medicine at [facility], Chales [place], MD   2 months ago Post-viral cough syndrome   [facility] Primary Care & Sports Medicine at [facility], Dessie Flow, MD       Future Appointments             In 4 days Gala Jubilee Chales [place], MD [facility] Health Primary Care & Sports Medicine at [facility], [facility]

## 2023-05-18 ENCOUNTER — Other Ambulatory Visit: Payer: Self-pay | Admitting: Internal Medicine

## 2023-05-18 DIAGNOSIS — E1169 Type 2 diabetes mellitus with other specified complication: Secondary | ICD-10-CM

## 2023-05-20 NOTE — Telephone Encounter (Signed)
 Call to patient- RF not needed- she is using mailorder Requested Prescriptions  Pending Prescriptions Disp Refills   atorvastatin  (LIPITOR) 20 MG tablet [Pharmacy Med Name: ATORVASTATIN  20 MG TABLET] 90 tablet 1    Sig: TAKE (1) TABLET BY MOUTH EVERY DAY     Cardiovascular:  Antilipid - Statins Failed - 05/20/2023 10:53 AM      Failed - Lipid Panel in normal range within the last 12 months    Cholesterol, Total  Date Value Ref Range Status  11/21/2022 223 (H) 100 - 199 mg/dL Final   LDL Chol Calc (NIH)  Date Value Ref Range Status  11/21/2022 121 (H) 0 - 99 mg/dL Final   HDL  Date Value Ref Range Status  11/21/2022 55 >39 mg/dL Final   Triglycerides  Date Value Ref Range Status  11/21/2022 269 (H) 0 - 149 mg/dL Final         Passed - Patient is not pregnant      Passed - Valid encounter within last 12 months    Recent Outpatient Visits           2 months ago Essential hypertension   [place] [facility] Medicine at [facility], Chales [place], MD   2 months ago Post-viral cough syndrome   [place] [facility] Medicine at [facility], Dessie Flow, MD       Future Appointments             Tomorrow Gala Jubilee [place], MD [facility] Health Primary Care & Sports Medicine at [facility], [facility]

## 2023-05-21 ENCOUNTER — Encounter: Payer: Self-pay | Admitting: Internal Medicine

## 2023-05-21 ENCOUNTER — Ambulatory Visit (INDEPENDENT_AMBULATORY_CARE_PROVIDER_SITE_OTHER): Admitting: Internal Medicine

## 2023-05-21 VITALS — BP 116/74 | HR 78 | Ht 66.0 in | Wt 216.1 lb

## 2023-05-21 DIAGNOSIS — I10 Essential (primary) hypertension: Secondary | ICD-10-CM | POA: Diagnosis not present

## 2023-05-21 DIAGNOSIS — Z7985 Long-term (current) use of injectable non-insulin antidiabetic drugs: Secondary | ICD-10-CM

## 2023-05-21 DIAGNOSIS — F39 Unspecified mood [affective] disorder: Secondary | ICD-10-CM

## 2023-05-21 DIAGNOSIS — E118 Type 2 diabetes mellitus with unspecified complications: Secondary | ICD-10-CM | POA: Diagnosis not present

## 2023-05-21 DIAGNOSIS — M792 Neuralgia and neuritis, unspecified: Secondary | ICD-10-CM

## 2023-05-21 NOTE — Patient Instructions (Signed)
 Schedule appointment with [facility] for diabetic eye exam.

## 2023-05-21 NOTE — Assessment & Plan Note (Addendum)
 Now on Tegretol and lyrica  for neuropathic symptoms in legs, progressing to arms. Also new left sciatica type symptoms - may respond to tegretol.   No longer on steroids. Still being worked up by Neurology - believed to be autoimmune etiology.

## 2023-05-21 NOTE — Assessment & Plan Note (Addendum)
 Followed by Endo at [facility] Blood sugars have been stable.  No recent hypoglycemic events requiring assistance. Currently medications are Ozempic and MTF. Lab Results  Component Value Date   HGBA1C 5.3 03/04/2023  Her dose of Ozempic was increased to 2 mg last visit.   Pt is due for eye exam and U/Cr.

## 2023-05-21 NOTE — Assessment & Plan Note (Signed)
 Blood pressure is well controlled.  Current medications are metoprolol  resumed last visit. Will continue same regimen along with efforts to limit dietary sodium.

## 2023-05-21 NOTE — Assessment & Plan Note (Signed)
 Clinically improved since adding Buspar  10 mg bid to Lexapro  20 mg.   No SI or HI on evaluation. Family issues have contributed. Plan to continue same medications for now.

## 2023-05-21 NOTE — Progress Notes (Signed)
 Date:  05/21/2023   Name:  Gabrielle Tucker   DOB:  11-Jul-1960   MRN:  [id number]   Chief Complaint: Hypertension and Depression  Hypertension This is a chronic problem. The problem has been gradually improving since onset. The problem is controlled. Pertinent negatives include no chest pain, headaches, palpitations or shortness of breath. Past treatments include beta blockers. The current treatment provides significant improvement.  Depression        This is a chronic problem.  The problem has been gradually improving since onset.  Associated symptoms include myalgias.  Associated symptoms include no fatigue and no headaches.  Past treatments include SSRIs - Selective serotonin reuptake inhibitors and other medications (lexapro  with buspar  added last visit). Diabetes She presents for her follow-up diabetic visit. She has type 2 diabetes mellitus. Her disease course has been stable. Pertinent negatives for hypoglycemia include no dizziness, headaches or nervousness/anxiousness. Pertinent negatives for diabetes include no chest pain, no fatigue and no weakness.  Back Pain This is a new problem. The current episode started more than 1 month ago. The problem is unchanged. The pain is present in the gluteal. The quality of the pain is described as aching. The pain radiates to the left thigh. The pain is mild. The pain is Worse during the night. The symptoms are aggravated by twisting and bending. Associated symptoms include bladder incontinence and bowel incontinence. Pertinent negatives include no abdominal pain, chest pain, dysuria, headaches, numbness or weakness. Treatments tried: on Lyrica  and titrating up carbamazepine.    Review of Systems  Constitutional:  Negative for fatigue and unexpected weight change.  HENT:  Negative for trouble swallowing.   Eyes:  Negative for visual disturbance.  Respiratory:  Negative for cough, chest tightness, shortness of breath and wheezing.    Cardiovascular:  Negative for chest pain, palpitations and leg swelling.  Gastrointestinal:  Positive for bowel incontinence. Negative for abdominal pain, constipation and diarrhea.  Genitourinary:  Positive for bladder incontinence. Negative for dysuria.  Musculoskeletal:  Positive for back pain and myalgias. Negative for arthralgias.  Neurological:  Negative for dizziness, weakness, light-headedness, numbness and headaches.  Psychiatric/Behavioral:  Positive for depression and sleep disturbance. Negative for dysphoric mood. The patient is not nervous/anxious.      Lab Results  Component Value Date   NA 140 11/21/2022   K 4.2 11/21/2022   CO2 24 11/21/2022   GLUCOSE 83 11/21/2022   BUN 14 11/21/2022   CREATININE 0.95 11/21/2022   CALCIUM  9.1 11/21/2022   EGFR 68 11/21/2022   GFRNONAA 92 03/18/2019   Lab Results  Component Value Date   CHOL 223 (H) 11/21/2022   HDL 55 11/21/2022   LDLCALC 121 (H) 11/21/2022   TRIG 269 (H) 11/21/2022   CHOLHDL 4.1 11/21/2022   Lab Results  Component Value Date   TSH 2.44 03/04/2023   Lab Results  Component Value Date   HGBA1C 5.3 03/04/2023   Lab Results  Component Value Date   WBC 11.5 04/27/2021   HGB 12.7 04/27/2021   HCT 37 04/27/2021   MCV 92 01/07/2019   PLT 221 04/27/2021   Lab Results  Component Value Date   ALT 10 11/21/2022   AST 16 11/21/2022   ALKPHOS 142 (H) 11/21/2022   BILITOT <0.2 11/21/2022   No results found for: "25OHVITD2", "25OHVITD3", "VD25OH"   Patient Active Problem List   Diagnosis Date Noted   Neuropathic pain 05/13/2023   Neuropathic bladder 05/13/2023   Neurogenic bowel 05/13/2023  Essential hypertension 03/06/2023   Low bone mass 02/13/2022   Post-surgical hypothyroidism 03/22/2021   Osteopenia of hip 09/05/2020   Status post hysterectomy 03/01/2020   Type II diabetes mellitus with complication (HCC) 01/07/2019   Mood disorder (HCC) 01/07/2019   S/P total thyroidectomy 12/16/2017    Multiple thyroid  nodules 11/12/2017   Hyperlipidemia associated with type 2 diabetes mellitus (HCC) 08/03/2016   Arthritis of knee, left 08/02/2016   Back muscle spasm 07/03/2014   H/O peptic ulcer 07/03/2014   Migraine without aura and responsive to treatment 07/03/2014   Muscle contraction headache 07/03/2014   Idiopathic insomnia 07/03/2014    Allergies  Allergen Reactions   Cefdinir  Diarrhea   Morphine And Codeine  Nausea And Vomiting   Tape Hives and Rash    No [paper tape.  Tega derm OK    Past Surgical History:  Procedure Laterality Date   ANTERIOR FUSION CERVICAL SPINE  2006   BREAST REDUCTION SURGERY     CHOLECYSTECTOMY  05/05/2019   COLONOSCOPY WITH PROPOFOL  N/A 08/24/2020   Procedure: COLONOSCOPY WITH PROPOFOL ;  Surgeon: Toledo, Alphonsus Jeans, MD;  Location: [facility] ENDOSCOPY;  Service: Gastroenterology;  Laterality: N/A;  DM   ESOPHAGOGASTRODUODENOSCOPY  2011   gastric ulcer   ESOPHAGOGASTRODUODENOSCOPY (EGD) WITH PROPOFOL  N/A 08/24/2020   Procedure: ESOPHAGOGASTRODUODENOSCOPY (EGD) WITH PROPOFOL ;  Surgeon: Toledo, Alphonsus Jeans, MD;  Location: [facility] ENDOSCOPY;  Service: Gastroenterology;  Laterality: N/A;   LITHOTRIPSY  2012   with setnet   LUMBAR DISC SURGERY     NASAL SINUS SURGERY  2015   partail knee replacement Bilateral    REDUCTION MAMMAPLASTY Bilateral 2011   REPAIR TENDONS FOOT Left    THYROIDECTOMY Bilateral 12/16/2017   Procedure: THYROIDECTOMY;  Surgeon: Mellody Sprout, MD;  Location: [facility] ORS;  Service: ENT;  Laterality: Bilateral;   TONSILLECTOMY     TOTAL ABDOMINAL HYSTERECTOMY     Ovaries Remain.   TUBAL LIGATION      Social History   Tobacco Use   Smoking status: Never   Smokeless tobacco: Never  Vaping Use   Vaping status: Never Used  Substance Use Topics   Alcohol use: Yes    Alcohol/week: 0.0 - 1.0 standard drinks of alcohol    Comment: on occassion   Drug use: Not Currently    Types: Marijuana     Medication list has been reviewed and  updated.  Current Meds  Medication Sig   alendronate (FOSAMAX) 70 MG tablet Take 70 mg by mouth once a week.   atorvastatin  (LIPITOR) 20 MG tablet TAKE (1) TABLET BY MOUTH EVERY DAY   busPIRone  (BUSPAR ) 10 MG tablet Take 1 tablet (10 mg total) by mouth 2 (two) times daily.   carbamazepine (TEGRETOL) 200 MG tablet Take 200 mg by mouth 2 (two) times daily.   escitalopram  (LEXAPRO ) 20 MG tablet Take 1 tablet (20 mg total) by mouth daily.   estradiol (ESTRACE) 0.1 MG/GM vaginal cream Place vaginally.   fluticasone  (FLONASE ) 50 MCG/ACT nasal spray Place 2 sprays into both nostrils daily.   levothyroxine (SYNTHROID) 175 MCG tablet Take 175 mcg by mouth daily.   lisinopril  (ZESTRIL ) 10 MG tablet Take 10 mg by mouth daily.   metFORMIN (GLUCOPHAGE-XR) 500 MG 24 hr tablet Take 500 mg by mouth 2 (two) times daily. 2 in AM 2 in PM   metoprolol  succinate (TOPROL -XL) 50 MG 24 hr tablet TAKE (1) TABLET BY MOUTH EVERY DAY   OZEMPIC, 1 MG/DOSE, 4 MG/3ML SOPN Inject 1 mg into the skin  once a week.   pantoprazole  (PROTONIX ) 40 MG tablet Take 1 tablet (40 mg total) by mouth 2 (two) times daily.   pregabalin  (LYRICA ) 150 MG capsule Take 1 capsule (150 mg total) by mouth 2 (two) times daily.   tiZANidine  (ZANAFLEX ) 4 MG tablet TAKE (1) TABLET BY MOUTH DAILY AS NEEDEDFOR MUSCLE SPASMS.   triamcinolone cream (KENALOG) 0.1 % Apply 1 Application topically 2 (two) times daily.   zaleplon  (SONATA ) 10 MG capsule TAKE (1) CAPSULE BY MOUTH EVERY NIGHT AT BEDTIME AS NEEDED FOR SLEEP       05/21/2023    1:16 PM 03/06/2023    1:40 PM 12/04/2022   11:15 AM 11/21/2022    1:37 PM  GAD 7 : Generalized Anxiety Score  Nervous, Anxious, on Edge 0 0 2 0  Control/stop worrying 0 0 0 0  Worry too much - different things 0 0 0 0  Trouble relaxing 0 0 0 0  Restless 0 0 0 1  Easily annoyed or irritable 0 0 0 0  Afraid - awful might happen 0 0 0 0  Total GAD 7 Score 0 0 2 1  Anxiety Difficulty Not difficult at all Not difficult  at all Not difficult at all Not difficult at all       05/21/2023    1:16 PM 03/06/2023    1:40 PM 12/04/2022   11:14 AM  Depression screen PHQ 2/9  Decreased Interest 0 0 1  Down, Depressed, Hopeless 0 0 0  PHQ - 2 Score 0 0 1  Altered sleeping 1 0 0  Tired, decreased energy 1 0 2  Change in appetite 0 0 0  Feeling bad or failure about yourself  0 0 0  Trouble concentrating 1 1 0  Moving slowly or fidgety/restless 0 0 0  Suicidal thoughts 0 0 0  PHQ-9 Score 3 1 3   Difficult doing work/chores Not difficult at all Not difficult at all Not difficult at all    BP Readings from Last 3 Encounters:  05/21/23 116/74  03/06/23 134/78  11/21/22 126/74    Physical Exam Vitals and nursing note reviewed.  Constitutional:      General: She is not in acute distress.    Appearance: She is well-developed.  HENT:     Head: Normocephalic and atraumatic.  Cardiovascular:     Rate and Rhythm: Normal rate and regular rhythm.     Heart sounds: No murmur heard. Pulmonary:     Effort: Pulmonary effort is normal. No respiratory distress.     Breath sounds: No wheezing or rhonchi.  Musculoskeletal:     Cervical back: Normal range of motion.     Right lower leg: No edema.     Left lower leg: No edema.  Skin:    General: Skin is warm and dry.     Findings: No rash.  Neurological:     General: No focal deficit present.     Mental Status: She is alert and oriented to person, place, and time.  Psychiatric:        Mood and Affect: Mood normal.        Behavior: Behavior normal.     Wt Readings from Last 3 Encounters:  05/21/23 216 lb 2 oz (98 kg)  03/06/23 214 lb 3.2 oz (97.2 kg)  11/21/22 205 lb (93 kg)    BP 116/74   Pulse 78   Ht 5\' 6"  (1.676 m)   Wt 216 lb 2 oz (98 kg)  SpO2 95%   BMI 34.88 kg/m   Assessment and Plan:  Problem List Items Addressed This Visit       Unprioritized   Type II diabetes mellitus with complication (HCC) (Chronic)   Followed by [facility] at  [facility] Blood sugars have been stable.  No recent hypoglycemic events requiring assistance. Currently medications are Ozempic and MTF. Lab Results  Component Value Date   HGBA1C 5.3 03/04/2023  Her dose of Ozempic was increased to 2 mg last visit.   Pt is due for eye exam and U/Cr.       Relevant Orders   Microalbumin / creatinine urine ratio   Mood disorder (HCC) (Chronic)   Clinically improved since adding Buspar  10 mg bid to Lexapro  20 mg.   No SI or HI on evaluation. Family issues have contributed. Plan to continue same medications for now.      Essential hypertension - Primary (Chronic)   Blood pressure is well controlled.  Current medications are metoprolol  resumed last visit. Will continue same regimen along with efforts to limit dietary sodium.       Neuropathic pain (Chronic)   Now on Tegretol and lyrica  for neuropathic symptoms in legs, progressing to arms. Also new left sciatica type symptoms - may respond to tegretol.   No longer on steroids. Still being worked up by Neurology - believed to be autoimmune etiology.      Other Visit Diagnoses       Long-term current use of injectable noninsulin antidiabetic medication           Return in about 6 months (around 11/20/2023) for CPX.    Sheron Dixons, MD [facility] Health Primary Care and Sports Medicine [place]

## 2023-05-22 LAB — MICROALBUMIN / CREATININE URINE RATIO
Creatinine, Urine: 53.5 mg/dL
Microalb/Creat Ratio: <6 mg/g{creat} (ref 0–29)
Microalbumin, Urine: <3 ug/mL

## 2023-05-28 ENCOUNTER — Other Ambulatory Visit: Payer: Self-pay | Admitting: Internal Medicine

## 2023-05-28 DIAGNOSIS — F39 Unspecified mood [affective] disorder: Secondary | ICD-10-CM

## 2023-05-29 NOTE — Telephone Encounter (Signed)
 Per OV 05/21/23- continue- return October. Alittle early for RF- but since OV states continue will give RF. Requested Prescriptions  Pending Prescriptions Disp Refills   escitalopram  (LEXAPRO ) 20 MG tablet [Pharmacy Med Name: ESCITALOPRAM  20 MG TABLET] 90 tablet 0    Sig: TAKE 1 TABLET BY MOUTH EVERY DAY     Psychiatry:  Antidepressants - SSRI Passed - 05/29/2023  1:55 PM      Passed - Valid encounter within last 6 months    Recent Outpatient Visits           1 week ago Essential hypertension   [place] [facility] Care & [facility] Medicine at [facility], Chales [place], MD   2 months ago Essential hypertension   [place] Primary Care & Sports Medicine at [facility], Chales [place], MD   2 months ago Post-viral cough syndrome   [place] Primary Care & Sports Medicine at [facility], Dessie Flow, MD       Future Appointments             In 5 months Gala Jubilee [place], MD [facility] Health Primary Care & Sports Medicine at [facility], [facility]             busPIRone  (BUSPAR ) 10 MG tablet [Pharmacy Med Name: BUSPIRONE  HCL 10 MG TABLET] 180 tablet 0    Sig: TAKE 1 TABLET BY MOUTH TWICE A DAY     Psychiatry: Anxiolytics/Hypnotics - Non-controlled Passed - 05/29/2023  1:55 PM      Passed - Valid encounter within last 12 months    Recent Outpatient Visits           1 week ago Essential hypertension   [place] Primary Care & Sports Medicine at [facility], Chales [place], MD   2 months ago Essential hypertension    Primary Care & Sports Medicine at [facility], Chales [place], MD   2 months ago Post-viral cough syndrome   [facility] Primary Care & Sports Medicine at [facility], Dessie Flow, MD       Future Appointments             In 5 months Gala Jubilee, Chales [place], MD [facility] Health Primary Care & Sports Medicine at [facility], [place]

## 2023-06-05 ENCOUNTER — Other Ambulatory Visit: Payer: Self-pay | Admitting: Internal Medicine

## 2023-06-05 DIAGNOSIS — R000 Tachycardia, unspecified: Secondary | ICD-10-CM

## 2023-06-05 DIAGNOSIS — Z8711 Personal history of peptic ulcer disease: Secondary | ICD-10-CM

## 2023-06-05 DIAGNOSIS — F39 Unspecified mood [affective] disorder: Secondary | ICD-10-CM

## 2023-06-05 DIAGNOSIS — I10 Essential (primary) hypertension: Secondary | ICD-10-CM

## 2023-06-06 NOTE — Telephone Encounter (Signed)
 Too soon for refill.  Requested Prescriptions  Pending Prescriptions Disp Refills   busPIRone  (BUSPAR ) 10 MG tablet [Pharmacy Med Name: busPIRone  HCl Oral Tablet 10 MG] 180 tablet 3    Sig: TAKE 1 TABLET TWICE DAILY     Psychiatry: Anxiolytics/Hypnotics - Non-controlled Passed - 06/06/2023  3:59 PM      Passed - Valid encounter within last 12 months    Recent Outpatient Visits           2 weeks ago Essential hypertension   [place] [facility] at [facility], Chales [place], MD   3 months ago Essential hypertension   [place] [facility] at [facility], Chales [place], MD   3 months ago Post-viral cough syndrome   [place] [facility] at [facility], Dessie Flow, MD       Future Appointments             In 5 months Gala Jubilee Chales [place], MD [facility] at [facility]             metoprolol  succinate (TOPROL -XL) 50 MG 24 hr tablet [Pharmacy Med Name: Metoprolol  Succinate ER Oral Tablet Extended Release 24 Hour 50 MG] 90 tablet 3    Sig: TAKE 1 TABLET EVERY DAY     Cardiovascular:  Beta Blockers Passed - 06/06/2023  3:59 PM      Passed - Last BP in normal range    BP Readings from Last 1 Encounters:  05/21/23 116/74         Passed - Last Heart Rate in normal range    Pulse Readings from Last 1 Encounters:  05/21/23 78         Passed - Valid encounter within last 6 months    Recent Outpatient Visits           2 weeks ago Essential hypertension   [place] [facility] at [facility], Chales [place], MD   3 months ago Essential hypertension   [place] [facility] at [facility], Chales [place], MD   3 months ago Post-viral cough syndrome   [place] [facility] at [facility], Dessie Flow, MD       Future Appointments             In 5 months  Gala Jubilee Chales [place], MD [facility] at [facility], [facility]             escitalopram  (LEXAPRO ) 20 MG tablet [Pharmacy Med Name: Escitalopram  Oxalate Oral Tablet 20 MG] 90 tablet 3    Sig: TAKE 1 TABLET EVERY DAY     Psychiatry:  Antidepressants - SSRI Passed - 06/06/2023  3:59 PM      Passed - Valid encounter within last 6 months    Recent Outpatient Visits           2 weeks ago Essential hypertension   [place] [facility] at [facility], Chales [place], MD   3 months ago Essential hypertension   [place] [facility] at [facility], Chales [place], MD   3 months ago Post-viral cough syndrome   [facility] at [facility], Dessie Flow, MD       Future Appointments  In 5 months Gala Jubilee [place], MD [facility] & Sports Medicine at [facility], [facility]             pantoprazole  (PROTONIX ) 40 MG tablet [Pharmacy Med Name: Pantoprazole  Sodium Oral Tablet Delayed Release 40 MG] 180 tablet 0    Sig: TAKE 1 TABLET TWICE DAILY     Gastroenterology: Proton Pump Inhibitors Passed - 06/06/2023  3:59 PM      Passed - Valid encounter within last 12 months    Recent Outpatient Visits           2 weeks ago Essential hypertension   [place] [facility] at [facility], Chales [place], MD   3 months ago Essential hypertension   [place] [facility] at [facility], Chales [place], MD   3 months ago Post-viral cough syndrome   [facility] at [facility], Dessie Flow, MD       Future Appointments             In 5 months Gala Jubilee, Chales [place], MD [facility] at [facility], [facility]

## 2023-06-07 ENCOUNTER — Other Ambulatory Visit: Payer: Self-pay | Admitting: Internal Medicine

## 2023-06-07 DIAGNOSIS — I10 Essential (primary) hypertension: Secondary | ICD-10-CM

## 2023-06-07 DIAGNOSIS — R000 Tachycardia, unspecified: Secondary | ICD-10-CM

## 2023-06-07 DIAGNOSIS — F39 Unspecified mood [affective] disorder: Secondary | ICD-10-CM

## 2023-06-11 NOTE — Telephone Encounter (Signed)
 Duplicate request, refilled 05/29/23 for 90 days.  Requested Prescriptions  Pending Prescriptions Disp Refills   busPIRone  (BUSPAR ) 10 MG tablet [Pharmacy Med Name: busPIRone  HCl Oral Tablet 10 MG] 180 tablet 3    Sig: TAKE 1 TABLET TWICE DAILY     Psychiatry: Anxiolytics/Hypnotics - Non-controlled Passed - 06/11/2023  9:01 AM      Passed - Valid encounter within last 12 months    Recent Outpatient Visits           3 weeks ago Essential hypertension   [place] [facility] at [facility], Chales [place], MD   3 months ago Essential hypertension   [place] [facility] at [facility], Chales [place], MD   3 months ago Post-viral cough syndrome   [place] [facility] at [facility], Dessie Flow, MD       Future Appointments             In 5 months Sheron Dixons, MD [facility] Primary Care & Sports Medicine at [facility], [facility]             metoprolol  succinate (TOPROL -XL) 50 MG 24 hr tablet [Pharmacy Med Name: Metoprolol  Succinate ER Oral Tablet Extended Release 24 Hour 50 MG] 90 tablet 3    Sig: TAKE 1 TABLET EVERY DAY     Cardiovascular:  Beta Blockers Passed - 06/11/2023  9:01 AM      Passed - Last BP in normal range    BP Readings from Last 1 Encounters:  05/21/23 116/74         Passed - Last Heart Rate in normal range    Pulse Readings from Last 1 Encounters:  05/21/23 78         Passed - Valid encounter within last 6 months    Recent Outpatient Visits           3 weeks ago Essential hypertension   [place] [facility] at [facility], Chales [place], MD   3 months ago Essential hypertension   [place] [facility] at [facility], Chales [place], MD   3 months ago Post-viral cough syndrome   [place] [facility] at [facility], Dessie Flow, MD       Future Appointments              In 5 months Gala Jubilee [place], MD [facility] Primary Care & Sports Medicine at [facility], [facility]             escitalopram  (LEXAPRO ) 20 MG tablet [Pharmacy Med Name: Escitalopram  Oxalate Oral Tablet 20 MG] 90 tablet 3    Sig: TAKE 1 TABLET EVERY DAY     Psychiatry:  Antidepressants - SSRI Passed - 06/11/2023  9:01 AM      Passed - Valid encounter within last 6 months    Recent Outpatient Visits           3 weeks ago Essential hypertension   [place] [facility] at [facility], Chales [place], MD   3 months ago Essential hypertension   [place] [facility] at [facility], Chales [place], MD   3 months ago Post-viral cough syndrome   [facility] at [facility], Jason J, MD       Future  Appointments             In 5 months Gala Jubilee, [place], MD [facility] at [facility]

## 2023-06-26 ENCOUNTER — Encounter: Payer: Self-pay | Admitting: Internal Medicine

## 2023-06-26 DIAGNOSIS — F39 Unspecified mood [affective] disorder: Secondary | ICD-10-CM

## 2023-06-26 DIAGNOSIS — I10 Essential (primary) hypertension: Secondary | ICD-10-CM

## 2023-06-26 DIAGNOSIS — R000 Tachycardia, unspecified: Secondary | ICD-10-CM

## 2023-06-26 MED ORDER — ESCITALOPRAM OXALATE 20 MG PO TABS: 20.0000 mg | ORAL_TABLET | Freq: Every day | ORAL | 2 refills | Status: DC

## 2023-06-26 MED ORDER — BUSPIRONE HCL 10 MG PO TABS: 10.0000 mg | ORAL_TABLET | Freq: Two times a day (BID) | ORAL | 2 refills | Status: AC

## 2023-06-26 MED ORDER — METOPROLOL SUCCINATE ER 50 MG PO TB24: ORAL_TABLET | ORAL | 2 refills | Status: DC

## 2023-06-30 ENCOUNTER — Other Ambulatory Visit: Payer: Self-pay | Admitting: Internal Medicine

## 2023-06-30 DIAGNOSIS — F39 Unspecified mood [affective] disorder: Secondary | ICD-10-CM

## 2023-06-30 DIAGNOSIS — R000 Tachycardia, unspecified: Secondary | ICD-10-CM

## 2023-06-30 DIAGNOSIS — I10 Essential (primary) hypertension: Secondary | ICD-10-CM

## 2023-07-02 NOTE — Telephone Encounter (Signed)
 Requested by interface surescripts. Receipt confirmed by pharmacy 06/26/23 at 12:28 pm. Future visit in 4 months.  Requested Prescriptions  Refused Prescriptions Disp Refills   busPIRone  (BUSPAR ) 10 MG tablet [Pharmacy Med Name: busPIRone  HCl Oral Tablet 10 MG] 180 tablet 3    Sig: TAKE 1 TABLET TWICE DAILY     Psychiatry: Anxiolytics/Hypnotics - Non-controlled Passed - 07/02/2023  9:00 AM      Passed - Valid encounter within last 12 months    Recent Outpatient Visits           1 month ago Essential hypertension   [place] [facility] at [facility], Chales [place], MD   3 months ago Essential hypertension   [place] [facility] at [facility], Chales [place], MD   4 months ago Post-viral cough syndrome   [place] [facility] at [facility], Dessie Flow, MD       Future Appointments             In 4 months Sheron Dixons, MD [facility] Primary [facility] & [facility] at [facility], [facility]             metoprolol  succinate (TOPROL -XL) 50 MG 24 hr tablet [Pharmacy Med Name: Metoprolol  Succinate ER Oral Tablet Extended Release 24 Hour 50 MG] 90 tablet 3    Sig: TAKE 1 TABLET EVERY DAY     Cardiovascular:  Beta Blockers Passed - 07/02/2023  9:00 AM      Passed - Last BP in normal range    BP Readings from Last 1 Encounters:  05/21/23 116/74         Passed - Last Heart Rate in normal range    Pulse Readings from Last 1 Encounters:  05/21/23 78         Passed - Valid encounter within last 6 months    Recent Outpatient Visits           1 month ago Essential hypertension   [place] [facility] at [facility], Chales [place], MD   3 months ago Essential hypertension   [place] [facility] at [facility], Chales [place], MD   4 months ago Post-viral cough syndrome   [place] [facility] at [facility], Dessie Flow, MD       Future Appointments             In 4 months Gala Jubilee Ch[place], MD [facility] Primary Care & Sports Medicine at [facility], [facility]             escitalopram  (LEXAPRO ) 20 MG tablet [Pharmacy Med Name: Escitalopram  Oxalate Oral Tablet 20 MG] 90 tablet 3    Sig: TAKE 1 TABLET EVERY DAY     Psychiatry:  Antidepressants - SSRI Passed - 07/02/2023  9:00 AM      Passed - Valid encounter within last 6 months    Recent Outpatient Visits           1 month ago Essential hypertension   [place] [facility] at [facility], Chales [place], MD   3 months ago Essential hypertension   [place] [facility] at [facility], Chales [place], MD   4 months ago Post-viral cough syndrome   [facility] Medicine at [facility],  Dessie Flow, MD       Future Appointments             In 4 months Gala Jubilee Ch[place], MD [facility] Primary Care & Sports Medicine at [facility]

## 2023-07-04 ENCOUNTER — Encounter: Payer: Self-pay | Admitting: Internal Medicine

## 2023-07-05 ENCOUNTER — Ambulatory Visit (INDEPENDENT_AMBULATORY_CARE_PROVIDER_SITE_OTHER): Admitting: Internal Medicine

## 2023-07-05 ENCOUNTER — Encounter: Payer: Self-pay | Admitting: Internal Medicine

## 2023-07-05 VITALS — BP 138/84 | HR 86 | Temp 98.0°F | Ht 66.0 in | Wt 211.1 lb

## 2023-07-05 DIAGNOSIS — R197 Diarrhea, unspecified: Secondary | ICD-10-CM | POA: Diagnosis not present

## 2023-07-05 DIAGNOSIS — R198 Other specified symptoms and signs involving the digestive system and abdomen: Secondary | ICD-10-CM | POA: Diagnosis not present

## 2023-07-05 NOTE — Progress Notes (Signed)
 Date:  07/05/2023   Name:  Gabrielle Tucker   DOB:  1960/07/27   MRN:  [id number]   Chief Complaint: Headache (Stated Saturday, located in the back of the head, OTC tylenol , helped ease off but came back after ) and Diarrhea (Since Saturday, not much of an appetite, nauseous, 3 - 4 times a day)  Headache  This is a recurrent problem. The current episode started in the past 7 days. The problem occurs intermittently. The pain is located in the Parietal region. The pain does not radiate. The pain quality is not similar to prior headaches. The quality of the pain is described as aching. The pain is mild. Pertinent negatives include no abdominal pain, coughing, fever, nausea or vomiting. She has tried acetaminophen  for the symptoms. The treatment provided moderate relief.  Diarrhea  This is a new problem. The current episode started in the past 7 days. The problem occurs less than 2 times per day (now but was much more often). The stool consistency is described as Watery. The patient states that diarrhea does not awaken her from sleep. Associated symptoms include headaches. Pertinent negatives include no abdominal pain, chills, coughing, fever, myalgias, sweats or vomiting.    Review of Systems  Constitutional:  Negative for chills and fever.  Respiratory:  Negative for cough, chest tightness and shortness of breath.   Cardiovascular:  Negative for chest pain.  Gastrointestinal:  Positive for diarrhea. Negative for abdominal pain, blood in stool, nausea and vomiting.  Musculoskeletal:  Negative for myalgias.  Neurological:  Positive for headaches.  Psychiatric/Behavioral:  Negative for dysphoric mood and sleep disturbance. The patient is not nervous/anxious.      Lab Results  Component Value Date   NA 140 11/21/2022   K 4.2 11/21/2022   CO2 24 11/21/2022   GLUCOSE 83 11/21/2022   BUN 14 11/21/2022   CREATININE 0.95 11/21/2022   CALCIUM  9.1 11/21/2022   EGFR 68 11/21/2022   GFRNONAA  92 03/18/2019   Lab Results  Component Value Date   CHOL 223 (H) 11/21/2022   HDL 55 11/21/2022   LDLCALC 121 (H) 11/21/2022   TRIG 269 (H) 11/21/2022   CHOLHDL 4.1 11/21/2022   Lab Results  Component Value Date   TSH 2.44 03/04/2023   Lab Results  Component Value Date   HGBA1C 5.3 03/04/2023   Lab Results  Component Value Date   WBC 11.5 04/27/2021   HGB 12.7 04/27/2021   HCT 37 04/27/2021   MCV 92 01/07/2019   PLT 221 04/27/2021   Lab Results  Component Value Date   ALT 10 11/21/2022   AST 16 11/21/2022   ALKPHOS 142 (H) 11/21/2022   BILITOT <0.2 11/21/2022   No results found for: Lucetta Russel, VD25OH   Patient Active Problem List   Diagnosis Date Noted   Neuropathic pain 05/13/2023   Neuropathic bladder 05/13/2023   Neurogenic bowel 05/13/2023   Essential hypertension 03/06/2023   Low bone mass 02/13/2022   Post-surgical hypothyroidism 03/22/2021   Osteopenia of hip 09/05/2020   Status post hysterectomy 03/01/2020   Type II diabetes mellitus with complication (HCC) 01/07/2019   Mood disorder (HCC) 01/07/2019   S/P total thyroidectomy 12/16/2017   Multiple thyroid  nodules 11/12/2017   Hyperlipidemia associated with type 2 diabetes mellitus (HCC) 08/03/2016   Arthritis of knee, left 08/02/2016   Back muscle spasm 07/03/2014   H/O peptic ulcer 07/03/2014   Migraine without aura and responsive to treatment 07/03/2014   Muscle  contraction headache 07/03/2014   Idiopathic insomnia 07/03/2014    Allergies  Allergen Reactions   Cefdinir  Diarrhea   Morphine And Codeine  Nausea And Vomiting   Tape Hives and Rash    No [paper tape.  Tega derm OK    Past Surgical History:  Procedure Laterality Date   ANTERIOR FUSION CERVICAL SPINE  2006   BREAST REDUCTION SURGERY     CHOLECYSTECTOMY  05/05/2019   COLONOSCOPY WITH PROPOFOL  N/A 08/24/2020   Procedure: COLONOSCOPY WITH PROPOFOL ;  Surgeon: Toledo, Alphonsus Jeans, MD;  Location: [facility] ENDOSCOPY;   Service: Gastroenterology;  Laterality: N/A;  DM   ESOPHAGOGASTRODUODENOSCOPY  2011   gastric ulcer   ESOPHAGOGASTRODUODENOSCOPY (EGD) WITH PROPOFOL  N/A 08/24/2020   Procedure: ESOPHAGOGASTRODUODENOSCOPY (EGD) WITH PROPOFOL ;  Surgeon: Toledo, Alphonsus Jeans, MD;  Location: [facility] ENDOSCOPY;  Service: Gastroenterology;  Laterality: N/A;   LITHOTRIPSY  2012   with setnet   LUMBAR DISC SURGERY     NASAL SINUS SURGERY  2015   partail knee replacement Bilateral    REDUCTION MAMMAPLASTY Bilateral 2011   REPAIR TENDONS FOOT Left    THYROIDECTOMY Bilateral 12/16/2017   Procedure: THYROIDECTOMY;  Surgeon: Mellody Sprout, MD;  Location: [facility] ORS;  Service: ENT;  Laterality: Bilateral;   TONSILLECTOMY     TOTAL ABDOMINAL HYSTERECTOMY     Ovaries Remain.   TUBAL LIGATION      Social History   Tobacco Use   Smoking status: Never   Smokeless tobacco: Never  Vaping Use   Vaping status: Never Used  Substance Use Topics   Alcohol use: Yes    Alcohol/week: 0.0 - 1.0 standard drinks of alcohol    Comment: on occassion   Drug use: Not Currently    Types: Marijuana     Medication list has been reviewed and updated.  Current Meds  Medication Sig   alendronate (FOSAMAX) 70 MG tablet Take 70 mg by mouth once a week.   atorvastatin  (LIPITOR) 20 MG tablet TAKE (1) TABLET BY MOUTH EVERY DAY   busPIRone  (BUSPAR ) 10 MG tablet Take 1 tablet (10 mg total) by mouth 2 (two) times daily.   calcium  citrate-vitamin D  (CITRACAL+D) 315-200 MG-UNIT tablet Take 2 tablets by mouth 2 (two) times daily.   carbamazepine (TEGRETOL) 200 MG tablet Take 200 mg by mouth 2 (two) times daily.   escitalopram  (LEXAPRO ) 20 MG tablet Take 1 tablet (20 mg total) by mouth daily.   estradiol (ESTRACE) 0.1 MG/GM vaginal cream Place vaginally.   fluticasone  (FLONASE ) 50 MCG/ACT nasal spray Place 2 sprays into both nostrils daily.   levothyroxine (SYNTHROID) 175 MCG tablet Take 175 mcg by mouth daily.   metFORMIN (GLUCOPHAGE-XR) 500 MG  24 hr tablet Take 500 mg by mouth 2 (two) times daily. 2 in AM 2 in PM   metoprolol  succinate (TOPROL -XL) 50 MG 24 hr tablet TAKE (1) TABLET BY MOUTH EVERY DAY   OZEMPIC, 1 MG/DOSE, 4 MG/3ML SOPN Inject 1 mg into the skin once a week.   pantoprazole  (PROTONIX ) 40 MG tablet TAKE 1 TABLET TWICE DAILY   pregabalin  (LYRICA ) 150 MG capsule Take 1 capsule (150 mg total) by mouth 2 (two) times daily.   tiZANidine  (ZANAFLEX ) 4 MG tablet TAKE (1) TABLET BY MOUTH DAILY AS NEEDEDFOR MUSCLE SPASMS.   zaleplon  (SONATA ) 10 MG capsule TAKE (1) CAPSULE BY MOUTH EVERY NIGHT AT BEDTIME AS NEEDED FOR SLEEP       05/21/2023    1:16 PM 03/06/2023    1:40 PM 12/04/2022  11:15 AM 11/21/2022    1:37 PM  GAD 7 : Generalized Anxiety Score  Nervous, Anxious, on Edge 0 0 2 0  Control/stop worrying 0 0 0 0  Worry too much - different things 0 0 0 0  Trouble relaxing 0 0 0 0  Restless 0 0 0 1  Easily annoyed or irritable 0 0 0 0  Afraid - awful might happen 0 0 0 0  Total GAD 7 Score 0 0 2 1  Anxiety Difficulty Not difficult at all Not difficult at all Not difficult at all Not difficult at all       05/21/2023    1:16 PM 03/06/2023    1:40 PM 12/04/2022   11:14 AM  Depression screen PHQ 2/9  Decreased Interest 0 0 1  Down, Depressed, Hopeless 0 0 0  PHQ - 2 Score 0 0 1  Altered sleeping 1 0 0  Tired, decreased energy 1 0 2  Change in appetite 0 0 0  Feeling bad or failure about yourself  0 0 0  Trouble concentrating 1 1 0  Moving slowly or fidgety/restless 0 0 0  Suicidal thoughts 0 0 0  PHQ-9 Score 3 1 3   Difficult doing work/chores Not difficult at all Not difficult at all Not difficult at all    BP Readings from Last 3 Encounters:  07/05/23 138/84  05/21/23 116/74  03/06/23 134/78    Physical Exam Constitutional:      General: She is not in acute distress.    Appearance: She is well-developed. She is not ill-appearing.   Cardiovascular:     Rate and Rhythm: Normal rate and regular  rhythm.     Heart sounds: No murmur heard. Pulmonary:     Breath sounds: No wheezing or rhonchi.  Abdominal:     General: Bowel sounds are normal. There is no distension.     Palpations: Abdomen is soft. There is no mass.     Tenderness: There is no abdominal tenderness. There is no guarding.   Neurological:     Mental Status: She is alert.     Wt Readings from Last 3 Encounters:  07/05/23 211 lb 2 oz (95.8 kg)  05/21/23 216 lb 2 oz (98 kg)  03/06/23 214 lb 3.2 oz (97.2 kg)    BP 138/84   Pulse 86   Temp 98 F (36.7 C)   Ht 5' 6 (1.676 m)   Wt 211 lb 2 oz (95.8 kg)   SpO2 96%   BMI 34.08 kg/m   Assessment and Plan:  Problem List Items Addressed This Visit   None Visit Diagnoses       Gastrointestinal complaint    -  Primary   likely viral with no red flag s/s continue fluids, electrolytes and Tylenol  rest and follow up if no improvement     Diarrhea of presumed infectious origin       Hydrate and use imodium bid prn       No follow-ups on file.    Sheron Dixons, MD [facility] Health Primary Care and Sports Medicine [place]

## 2023-07-22 ENCOUNTER — Other Ambulatory Visit: Payer: Self-pay | Admitting: Internal Medicine

## 2023-07-24 NOTE — Telephone Encounter (Signed)
 Requested medication (s) are due for refill today:   Provider to review  Requested medication (s) are on the active medication list:   Yes  Future visit scheduled:   Yes 10/30   Last ordered: 04/16/2023 #180, 1 refill  Non delegated refill    Requested Prescriptions  Pending Prescriptions Disp Refills   pregabalin  (LYRICA ) 150 MG capsule [Pharmacy Med Name: Pregabalin  Oral Capsule 150 MG] 180 capsule     Sig: TAKE 1 CAPSULE TWICE DAILY     Not Delegated - Neurology:  Anticonvulsants - Controlled - pregabalin  Failed - 07/24/2023  1:59 PM      Failed - This refill cannot be delegated      Passed - Cr in normal range and within 360 days    Creatinine, Ser  Date Value Ref Range Status  11/21/2022 0.95 0.57 - 1.00 mg/dL Final   Creatinine, Urine  Date Value Ref Range Status  05/29/2022 264  Final         Passed - Completed PHQ-2 or PHQ-9 in the last 360 days      Passed - Valid encounter within last 12 months    Recent Outpatient Visits           2 weeks ago Gastrointestinal complaint   [place] [facility] at [facility], Leita DEL, MD   2 months ago Essential hypertension   [place] [facility] at [facility], Leita DEL, MD   4 months ago Essential hypertension   [place] [facility] at [facility], Leita DEL, MD   4 months ago Post-viral cough syndrome   [facility] at [facility], Selinda PARAS, MD       Future Appointments             In 4 months Justus, Leita DEL, MD [facility] Health Primary Care & Sports Medicine at [facility], [facility]

## 2023-07-24 NOTE — Telephone Encounter (Signed)
 Please review.  KP

## 2023-08-15 LAB — HM DIABETES EYE EXAM

## 2023-08-23 ENCOUNTER — Encounter: Payer: Self-pay | Admitting: Internal Medicine

## 2023-09-15 ENCOUNTER — Other Ambulatory Visit: Payer: Self-pay | Admitting: Internal Medicine

## 2023-09-15 DIAGNOSIS — Z8711 Personal history of peptic ulcer disease: Secondary | ICD-10-CM

## 2023-09-17 NOTE — Telephone Encounter (Signed)
 Requested Prescriptions  Pending Prescriptions Disp Refills   pantoprazole  (PROTONIX ) 40 MG tablet [Pharmacy Med Name: PANTOPRAZOLE  SODIUM 40 MG Oral Tablet Delayed Release] 180 tablet 3    Sig: TAKE 1 TABLET TWICE DAILY     Gastroenterology: Proton Pump Inhibitors Passed - 09/17/2023 11:14 AM      Passed - Valid encounter within last 12 months    Recent Outpatient Visits           2 months ago Gastrointestinal complaint   [place] [facility] at [facility], Leita DEL, MD   3 months ago Essential hypertension   [place] [facility] Medicine at [facility], Leita DEL, MD   6 months ago Essential hypertension   [place] [facility] Medicine at [facility], Leita DEL, MD   6 months ago Post-viral cough syndrome   [facility] Sports Medicine at [facility], Selinda PARAS, MD       Future Appointments             In 2 months Justus, Leita DEL, MD [facility] Primary Care & Sports Medicine at [facility], [facility]

## 2023-10-14 ENCOUNTER — Other Ambulatory Visit: Payer: Self-pay | Admitting: Internal Medicine

## 2023-10-14 DIAGNOSIS — E1169 Type 2 diabetes mellitus with other specified complication: Secondary | ICD-10-CM

## 2023-10-15 DIAGNOSIS — D8689 Sarcoidosis of other sites: Secondary | ICD-10-CM | POA: Diagnosis not present

## 2023-10-15 DIAGNOSIS — G629 Polyneuropathy, unspecified: Secondary | ICD-10-CM | POA: Diagnosis not present

## 2023-10-15 LAB — COMPREHENSIVE METABOLIC PANEL WITH GFR: eGFR: 77

## 2023-10-15 NOTE — Telephone Encounter (Signed)
 Requested Prescriptions  Pending Prescriptions Disp Refills   atorvastatin  (LIPITOR) 20 MG tablet [Pharmacy Med Name: ATORVASTATIN  CALCIUM  20 MG Oral Tablet] 90 tablet 0    Sig: TAKE 1 TABLET EVERY DAY     Cardiovascular:  Antilipid - Statins Failed - 10/15/2023  9:39 AM      Failed - Lipid Panel in normal range within the last 12 months    Cholesterol, Total  Date Value Ref Range Status  11/21/2022 223 (H) 100 - 199 mg/dL Final   LDL Chol Calc (NIH)  Date Value Ref Range Status  11/21/2022 121 (H) 0 - 99 mg/dL Final   HDL  Date Value Ref Range Status  11/21/2022 55 >39 mg/dL Final   Triglycerides  Date Value Ref Range Status  11/21/2022 269 (H) 0 - 149 mg/dL Final         Passed - Patient is not pregnant      Passed - Valid encounter within last 12 months    Recent Outpatient Visits           3 months ago Gastrointestinal complaint   [place] [facility] at [facility], Leita DEL, MD   4 months ago Essential hypertension   [place] [facility] Medicine at [facility], Leita DEL, MD   7 months ago Essential hypertension   [place] [facility] Care [facility] Sports Medicine at [facility], Leita DEL, MD   7 months ago Post-viral cough syndrome   [facility] Primary Care & Sports Medicine at [facility], Selinda PARAS, MD       Future Appointments             In 1 month Justus, Leita DEL, MD [facility] Health Primary Care & Sports Medicine at [facility], [phone] [place]

## 2023-10-17 ENCOUNTER — Encounter: Payer: Self-pay | Admitting: Diagnostic Neuroimaging

## 2023-10-22 ENCOUNTER — Telehealth: Payer: Self-pay | Admitting: Internal Medicine

## 2023-10-22 NOTE — Telephone Encounter (Signed)
 Copied from CRM [id number]. Topic: Medicare AWV >> Oct 22, 2023  9:01 AM Nathanel DEL wrote: Reason for CRM: Called LVM 10/22/2023 to schedule AWV. Please schedule office or virtual visits.  Nathanel Paschal; Care Guide Ambulatory Clinical Support [place] [facility] Direct Dial: [phone]

## 2023-10-23 DIAGNOSIS — Z09 Encounter for follow-up examination after completed treatment for conditions other than malignant neoplasm: Secondary | ICD-10-CM | POA: Diagnosis not present

## 2023-10-23 DIAGNOSIS — M8580 Other specified disorders of bone density and structure, unspecified site: Secondary | ICD-10-CM | POA: Diagnosis not present

## 2023-10-23 DIAGNOSIS — Z9089 Acquired absence of other organs: Secondary | ICD-10-CM | POA: Diagnosis not present

## 2023-10-23 DIAGNOSIS — E890 Postprocedural hypothyroidism: Secondary | ICD-10-CM | POA: Diagnosis not present

## 2023-10-23 DIAGNOSIS — Z85850 Personal history of malignant neoplasm of thyroid: Secondary | ICD-10-CM | POA: Diagnosis not present

## 2023-10-23 DIAGNOSIS — E119 Type 2 diabetes mellitus without complications: Secondary | ICD-10-CM | POA: Diagnosis not present

## 2023-10-23 DIAGNOSIS — C73 Malignant neoplasm of thyroid gland: Secondary | ICD-10-CM | POA: Diagnosis not present

## 2023-10-23 LAB — HEMOGLOBIN A1C: Hemoglobin A1C: 5.4

## 2023-10-28 ENCOUNTER — Encounter: Payer: Self-pay | Admitting: Internal Medicine

## 2023-10-28 NOTE — Telephone Encounter (Signed)
 Please review.  KP

## 2023-10-29 ENCOUNTER — Encounter: Payer: Self-pay | Admitting: Internal Medicine

## 2023-10-29 ENCOUNTER — Ambulatory Visit (INDEPENDENT_AMBULATORY_CARE_PROVIDER_SITE_OTHER): Admitting: Internal Medicine

## 2023-10-29 VITALS — BP 129/78 | HR 75 | Ht 66.0 in | Wt 215.0 lb

## 2023-10-29 DIAGNOSIS — Z1231 Encounter for screening mammogram for malignant neoplasm of breast: Secondary | ICD-10-CM

## 2023-10-29 DIAGNOSIS — B351 Tinea unguium: Secondary | ICD-10-CM | POA: Diagnosis not present

## 2023-10-29 DIAGNOSIS — Z23 Encounter for immunization: Secondary | ICD-10-CM

## 2023-10-29 MED ORDER — TERBINAFINE HCL 250 MG PO TABS
250.0000 mg | ORAL_TABLET | Freq: Every day | ORAL | 0 refills | Status: AC
Start: 2023-10-29 — End: ?

## 2023-10-29 NOTE — Progress Notes (Signed)
 Date:  10/29/2023   Name:  Gabrielle Tucker   DOB:  March 11, 1960   MRN:  [id number]   Chief Complaint: Nail Problem (Rt big toe. )  Toe Pain  There was no injury mechanism. The pain is present in the right toes. The pain is mild. The pain has been Fluctuating since onset. Exacerbated by: toe nail fungus - never completely resolved after Diflucan  weekly for 12 weeks. The treatment provided moderate relief.    Review of Systems  Constitutional:  Negative for chills, fatigue and fever.  Respiratory:  Negative for chest tightness and shortness of breath.   Cardiovascular:  Negative for chest pain and leg swelling.  Skin:        Nail changes   Neurological:  Negative for dizziness and headaches.  Psychiatric/Behavioral:  Negative for dysphoric mood and sleep disturbance. The patient is not nervous/anxious.       Lab Results  Component Value Date   NA 140 11/21/2022   K 4.2 11/21/2022   CO2 24 11/21/2022   GLUCOSE 83 11/21/2022   BUN 14 11/21/2022   CREATININE 0.95 11/21/2022   CALCIUM  9.1 11/21/2022   EGFR 77 10/15/2023   GFRNONAA 92 03/18/2019   Lab Results  Component Value Date   CHOL 223 (H) 11/21/2022   HDL 55 11/21/2022   LDLCALC 121 (H) 11/21/2022   TRIG 269 (H) 11/21/2022   CHOLHDL 4.1 11/21/2022   Lab Results  Component Value Date   TSH 2.44 03/04/2023   Lab Results  Component Value Date   HGBA1C 5.4 10/23/2023   Lab Results  Component Value Date   WBC 11.5 04/27/2021   HGB 12.7 04/27/2021   HCT 37 04/27/2021   MCV 92 01/07/2019   PLT 221 04/27/2021   Lab Results  Component Value Date   ALT 10 11/21/2022   AST 16 11/21/2022   ALKPHOS 142 (H) 11/21/2022   BILITOT <0.2 11/21/2022   No results found for: MARIEN BOLLS, VD25OH   Patient Active Problem List   Diagnosis Date Noted   Neuropathic pain 05/13/2023   Neuropathic bladder 05/13/2023   Neurogenic bowel 05/13/2023   Essential hypertension 03/06/2023   Low bone mass  02/13/2022   Post-surgical hypothyroidism 03/22/2021   Osteopenia of hip 09/05/2020   Status post hysterectomy 03/01/2020   Type II diabetes mellitus with complication (HCC) 01/07/2019   Mood disorder 01/07/2019   S/P total thyroidectomy 12/16/2017   Multiple thyroid  nodules 11/12/2017   Hyperlipidemia associated with type 2 diabetes mellitus (HCC) 08/03/2016   Arthritis of knee, left 08/02/2016   Back muscle spasm 07/03/2014   H/O peptic ulcer 07/03/2014   Migraine without aura and responsive to treatment 07/03/2014   Muscle contraction headache 07/03/2014   Idiopathic insomnia 07/03/2014    Allergies  Allergen Reactions   Cefdinir  Diarrhea   Morphine And Codeine  Nausea And Vomiting   Tape Hives and Rash    No [paper tape.  Tega derm OK    Past Surgical History:  Procedure Laterality Date   ANTERIOR FUSION CERVICAL SPINE  2006   BREAST REDUCTION SURGERY     CHOLECYSTECTOMY  05/05/2019   COLONOSCOPY WITH PROPOFOL  N/A 08/24/2020   Procedure: COLONOSCOPY WITH PROPOFOL ;  Surgeon: Toledo, Ladell POUR, MD;  Location: [facility];  Service: Gastroenterology;  Laterality: N/A;  DM   ESOPHAGOGASTRODUODENOSCOPY  2011   gastric ulcer   ESOPHAGOGASTRODUODENOSCOPY (EGD) WITH PROPOFOL  N/A 08/24/2020   Procedure: ESOPHAGOGASTRODUODENOSCOPY (EGD) WITH PROPOFOL ;  Surgeon: Nesconset, Teodoro K,  MD;  Location: [facility] ENDOS[facility];  Service: Gastroenterology;  Laterality: N/A;   LITHOTRIPSY  2012   with setnet   LUMBAR DISC SURGERY     NASAL SINUS SURGERY  2015   partail knee replacement Bilateral    REDUCTION MAMMAPLASTY Bilateral 2011   REPAIR TENDONS FOOT Left    THYROIDECTOMY Bilateral 12/16/2017   Procedure: THYROIDECTOMY;  Surgeon: Edda Mt, MD;  Location: [facility] ORS;  Service: ENT;  Laterality: Bilateral;   TONSILLECTOMY     TOTAL ABDOMINAL HYSTERECTOMY     Ovaries Remain.   TUBAL LIGATION      Social History   Tobacco Use   Smoking status: Never   Smokeless tobacco: Never   Vaping Use   Vaping status: Never Used  Substance Use Topics   Alcohol use: Yes    Alcohol/week: 0.0 - 1.0 standard drinks of alcohol    Comment: on occassion   Drug use: Not Currently    Types: Marijuana     Medication list has been reviewed and updated.  Current Meds  Medication Sig   alendronate (FOSAMAX) 70 MG tablet Take 70 mg by mouth once a week.   atorvastatin  (LIPITOR) 20 MG tablet TAKE 1 TABLET EVERY DAY   busPIRone  (BUSPAR ) 10 MG tablet Take 1 tablet (10 mg total) by mouth 2 (two) times daily.   calcium  citrate-vitamin D  (CITRACAL+D) 315-200 MG-UNIT tablet Take 2 tablets by mouth 2 (two) times daily.   carbamazepine (TEGRETOL) 200 MG tablet Take 200 mg by mouth 2 (two) times daily.   escitalopram  (LEXAPRO ) 20 MG tablet Take 1 tablet (20 mg total) by mouth daily.   estradiol (ESTRACE) 0.1 MG/GM vaginal cream Place vaginally.   fluticasone  (FLONASE ) 50 MCG/ACT nasal spray Place 2 sprays into both nostrils daily.   levothyroxine (SYNTHROID) 175 MCG tablet Take 175 mcg by mouth daily.   metFORMIN (GLUCOPHAGE-XR) 500 MG 24 hr tablet Take 500 mg by mouth 2 (two) times daily. 2 in AM 2 in PM   metoprolol  succinate (TOPROL -XL) 50 MG 24 hr tablet TAKE (1) TABLET BY MOUTH EVERY DAY   Naltrexone HCl, Pain, (LOTREXONE) 1.5 MG CAPS Take 4.5 mg by mouth at bedtime.   OZEMPIC, 1 MG/DOSE, 4 MG/3ML SOPN Inject 1 mg into the skin once a week.   pantoprazole  (PROTONIX ) 40 MG tablet TAKE 1 TABLET TWICE DAILY   pregabalin  (LYRICA ) 150 MG capsule TAKE 1 CAPSULE TWICE DAILY   terbinafine (LAMISIL) 250 MG tablet Take 1 tablet (250 mg total) by mouth daily.   tiZANidine  (ZANAFLEX ) 4 MG tablet TAKE (1) TABLET BY MOUTH DAILY AS NEEDEDFOR MUSCLE SPASMS.   Vitamin D , Ergocalciferol , (DRISDOL) 1.25 MG (50000 UNIT) CAPS capsule Take 1,250 mcg by mouth every 7 (seven) days.   zaleplon  (SONATA ) 10 MG capsule TAKE (1) CAPSULE BY MOUTH EVERY NIGHT AT BEDTIME AS NEEDED FOR SLEEP       10/29/2023    10:30 AM 05/21/2023    1:16 PM 03/06/2023    1:40 PM 12/04/2022   11:15 AM  GAD 7 : Generalized Anxiety Score  Nervous, Anxious, on Edge 0 0 0 2  Control/stop worrying 0 0 0 0  Worry too much - different things 0 0 0 0  Trouble relaxing 0 0 0 0  Restless 0 0 0 0  Easily annoyed or irritable 0 0 0 0  Afraid - awful might happen 0 0 0 0  Total GAD 7 Score 0 0 0 2  Anxiety Difficulty Not difficult at all  Not difficult at all Not difficult at all Not difficult at all       10/29/2023   10:30 AM 05/21/2023    1:16 PM 03/06/2023    1:40 PM  Depression screen PHQ 2/9  Decreased Interest 0 0 0  Down, Depressed, Hopeless 0 0 0  PHQ - 2 Score 0 0 0  Altered sleeping 1 1 0  Tired, decreased energy 0 1 0  Change in appetite 0 0 0  Feeling bad or failure about yourself  0 0 0  Trouble concentrating 0 1 1  Moving slowly or fidgety/restless 0 0 0  Suicidal thoughts 0 0 0  PHQ-9 Score 1 3 1   Difficult doing work/chores Not difficult at all Not difficult at all Not difficult at all    BP Readings from Last 3 Encounters:  10/29/23 129/78  07/05/23 138/84  05/21/23 116/74    Physical Exam Vitals and nursing note reviewed.  Constitutional:      General: She is not in acute distress.    Appearance: She is well-developed.  HENT:     Head: Normocephalic and atraumatic.  Pulmonary:     Effort: Pulmonary effort is normal. No respiratory distress.  Skin:    General: Skin is warm and dry.     Findings: No rash.     Comments: Right great toe nail with polish - non tender, no subungual redness or swelling; distal nail does not appear thickened.  Neurological:     Mental Status: She is alert and oriented to person, place, and time.  Psychiatric:        Mood and Affect: Mood normal.        Behavior: Behavior normal.     Wt Readings from Last 3 Encounters:  10/29/23 215 lb (97.5 kg)  07/05/23 211 lb 2 oz (95.8 kg)  05/21/23 216 lb 2 oz (98 kg)    BP 129/78   Pulse 75   Ht 5' 6  (1.676 m)   Wt 215 lb (97.5 kg)   BMI 34.70 kg/m   Assessment and Plan:  Problem List Items Addressed This Visit   None Visit Diagnoses       Onychomycosis    -  Primary   of a single nail - slightly uncomfortable and recurred after diflcan tx will treat with Lamisil 250 mg daily x 90 days recent LFTs were normal   Relevant Medications   terbinafine (LAMISIL) 250 MG tablet     Encounter for screening mammogram for breast cancer       schedule at [facility]   Relevant Orders   MM 3D SCREENING MAMMOGRAM BILATERAL BREAST     Encounter for immunization       Relevant Orders   Flu vaccine trivalent PF, 6mos and older(Flulaval,Afluria,Fluarix,Fluzone) (Completed)      She plans to change PCP to [facility] in [place].  No follow-ups on file.    Leita HILARIO Adie, MD [facility] [place]

## 2023-10-29 NOTE — Patient Instructions (Signed)
 Call [facility] to schedule your mammogram at [phone].

## 2023-10-30 ENCOUNTER — Other Ambulatory Visit: Payer: Self-pay | Admitting: *Deleted

## 2023-10-30 ENCOUNTER — Inpatient Hospital Stay
Admission: RE | Admit: 2023-10-30 | Discharge: 2023-10-30 | Disposition: A | Discharge status: Home / Self Care | Payer: Self-pay | Source: Ambulatory Visit | Attending: Internal Medicine | Admitting: Internal Medicine

## 2023-10-30 DIAGNOSIS — Z1231 Encounter for screening mammogram for malignant neoplasm of breast: Secondary | ICD-10-CM

## 2023-11-14 ENCOUNTER — Encounter: Payer: Self-pay | Admitting: Internal Medicine

## 2023-11-21 ENCOUNTER — Encounter: Admitting: Internal Medicine

## 2023-11-27 DIAGNOSIS — M5126 Other intervertebral disc displacement, lumbar region: Secondary | ICD-10-CM | POA: Diagnosis not present

## 2023-11-27 DIAGNOSIS — M47814 Spondylosis without myelopathy or radiculopathy, thoracic region: Secondary | ICD-10-CM | POA: Diagnosis not present

## 2023-11-27 DIAGNOSIS — M47816 Spondylosis without myelopathy or radiculopathy, lumbar region: Secondary | ICD-10-CM | POA: Diagnosis not present

## 2023-11-27 DIAGNOSIS — D8689 Sarcoidosis of other sites: Secondary | ICD-10-CM | POA: Diagnosis not present

## 2023-11-28 DIAGNOSIS — C73 Malignant neoplasm of thyroid gland: Secondary | ICD-10-CM | POA: Diagnosis not present

## 2023-11-28 DIAGNOSIS — Z09 Encounter for follow-up examination after completed treatment for conditions other than malignant neoplasm: Secondary | ICD-10-CM | POA: Diagnosis not present

## 2023-11-28 DIAGNOSIS — M85852 Other specified disorders of bone density and structure, left thigh: Secondary | ICD-10-CM | POA: Diagnosis not present

## 2023-11-28 DIAGNOSIS — Z85850 Personal history of malignant neoplasm of thyroid: Secondary | ICD-10-CM | POA: Diagnosis not present

## 2023-12-04 ENCOUNTER — Ambulatory Visit
Admission: RE | Admit: 2023-12-04 | Discharge: 2023-12-04 | Disposition: A | Discharge status: Home / Self Care | Source: Ambulatory Visit | Attending: Internal Medicine | Admitting: Internal Medicine

## 2023-12-04 DIAGNOSIS — Z1231 Encounter for screening mammogram for malignant neoplasm of breast: Secondary | ICD-10-CM | POA: Insufficient documentation

## 2023-12-12 ENCOUNTER — Encounter: Payer: Self-pay | Admitting: Internal Medicine

## 2023-12-12 ENCOUNTER — Telehealth: Admitting: Internal Medicine

## 2023-12-12 DIAGNOSIS — J40 Bronchitis, not specified as acute or chronic: Secondary | ICD-10-CM

## 2023-12-12 MED ORDER — PROMETHAZINE-DM 6.25-15 MG/5ML PO SYRP: 5.0000 mL | ORAL_SOLUTION | Freq: Four times a day (QID) | ORAL | 0 refills | Status: AC | PRN

## 2023-12-12 MED ORDER — DOXYCYCLINE HYCLATE 100 MG PO TABS: 100.0000 mg | ORAL_TABLET | Freq: Two times a day (BID) | ORAL | 0 refills | Status: AC

## 2023-12-12 NOTE — Progress Notes (Signed)
 Date:  12/12/2023   Name:  Gabrielle Tucker   DOB:  25-Nov-1960   MRN:  [id number]  This encounter was conducted via video encounter. This platform was deemed appropriate for the issues to be addressed.  The patient was correctly identified.  I advised that I am conducting the visit from a secure room in my office at [facility] clinic.  The patient is located home. The limitations of this form of encounter were discussed with the patient and he/she agreed to proceed.  Some vital signs will be absent.  Chief Complaint: Cough (X2 days, getting worse,Cough and congestion. Fever of 100. Getting some greenish uck up when coughing. Started Tuesday night.)  Cough This is a new problem. The current episode started yesterday. The problem occurs every few minutes. The cough is Productive of sputum. Associated symptoms include a fever and headaches. Pertinent negatives include no chest pain, chills, nasal congestion, postnasal drip, shortness of breath or wheezing. She has tried OTC cough suppressant for the symptoms. The treatment provided mild relief.    Review of Systems  Constitutional:  Positive for fatigue and fever. Negative for chills.  HENT:  Positive for congestion. Negative for postnasal drip and sinus pain.   Respiratory:  Positive for cough and chest tightness. Negative for shortness of breath and wheezing.   Cardiovascular:  Negative for chest pain and palpitations.  Neurological:  Positive for headaches. Negative for dizziness.  Psychiatric/Behavioral:  Positive for sleep disturbance. Negative for dysphoric mood. The patient is not nervous/anxious.      Lab Results  Component Value Date   NA 140 11/21/2022   K 4.2 11/21/2022   CO2 24 11/21/2022   GLUCOSE 83 11/21/2022   BUN 14 11/21/2022   CREATININE 0.95 11/21/2022   CALCIUM  9.1 11/21/2022   EGFR 77 10/15/2023   GFRNONAA 92 03/18/2019   Lab Results  Component Value Date   CHOL 223 (H) 11/21/2022   HDL 55 11/21/2022    LDLCALC 121 (H) 11/21/2022   TRIG 269 (H) 11/21/2022   CHOLHDL 4.1 11/21/2022   Lab Results  Component Value Date   TSH 2.44 03/04/2023   Lab Results  Component Value Date   HGBA1C 5.4 10/23/2023   Lab Results  Component Value Date   WBC 11.5 04/27/2021   HGB 12.7 04/27/2021   HCT 37 04/27/2021   MCV 92 01/07/2019   PLT 221 04/27/2021   Lab Results  Component Value Date   ALT 10 11/21/2022   AST 16 11/21/2022   ALKPHOS 142 (H) 11/21/2022   BILITOT <0.2 11/21/2022   No results found for: MARIEN BOLLS, VD25OH   Patient Active Problem List   Diagnosis Date Noted   Neuropathic pain 05/13/2023   Neuropathic bladder 05/13/2023   Neurogenic bowel 05/13/2023   Essential hypertension 03/06/2023   Low bone mass 02/13/2022   Post-surgical hypothyroidism 03/22/2021   Osteopenia of hip 09/05/2020   Status post hysterectomy 03/01/2020   Type II diabetes mellitus with complication (HCC) 01/07/2019   Mood disorder 01/07/2019   S/P total thyroidectomy 12/16/2017   Multiple thyroid  nodules 11/12/2017   Hyperlipidemia associated with type 2 diabetes mellitus (HCC) 08/03/2016   Arthritis of knee, left 08/02/2016   Back muscle spasm 07/03/2014   H/O peptic ulcer 07/03/2014   Migraine without aura and responsive to treatment 07/03/2014   Muscle contraction headache 07/03/2014   Idiopathic insomnia 07/03/2014    Allergies  Allergen Reactions   Cefdinir  Diarrhea   Morphine And  Codeine  Nausea And Vomiting   Gadolinium Derivatives Itching and Other (See Comments)    Pt states that after she received the 10 mls of IV Elucirem Gadopiclenol at 1540 hrs in MRI she suddenly felt itching in her face and after the scan the face was very flushed  red and she said it felt hot on the face especially around the eyes.   Tape Hives and Rash    No [paper tape.  Tega derm OK    Past Surgical History:  Procedure Laterality Date   ANTERIOR FUSION CERVICAL SPINE  2006   BREAST  REDUCTION SURGERY     CHOLECYSTECTOMY  05/05/2019   COLONOSCOPY WITH PROPOFOL  N/A 08/24/2020   Procedure: COLONOSCOPY WITH PROPOFOL ;  Surgeon: Toledo, Ladell POUR, MD;  Location: [facility] END[facility];  Service: Gastroenterology;  Laterality: N/A;  DM   ESOPHAGOGASTRODUODENOSCOPY  2011   gastric ulcer   ESOPHAGOGASTRODUODENOSCOPY (EGD) WITH PROPOFOL  N/A 08/24/2020   Procedure: ESOPHAGOGASTRODUODENOSCOPY (EGD) WITH PROPOFOL ;  Surgeon: Toledo, Ladell POUR, MD;  Location: [facility];  Service: Gastroenterology;  Laterality: N/A;   LITHOTRIPSY  2012   with setnet   LUMBAR DISC SURGERY     NASAL SINUS SURGERY  2015   partail knee replacement Bilateral    REDUCTION MAMMAPLASTY Bilateral 2011   REPAIR TENDONS FOOT Left    THYROIDECTOMY Bilateral 12/16/2017   Procedure: THYROIDECTOMY;  Surgeon: Edda Mt, MD;  Location: [facility] ORS;  Service: ENT;  Laterality: Bilateral;   TONSILLECTOMY     TOTAL ABDOMINAL HYSTERECTOMY     Ovaries Remain.   TUBAL LIGATION      Social History   Tobacco Use   Smoking status: Never   Smokeless tobacco: Never  Vaping Use   Vaping status: Never Used  Substance Use Topics   Alcohol use: Yes    Alcohol/week: 0.0 - 1.0 standard drinks of alcohol    Comment: on occassion   Drug use: Not Currently    Types: Marijuana     Medication list has been reviewed and updated.  Current Meds  Medication Sig   alendronate (FOSAMAX) 70 MG tablet Take 70 mg by mouth once a week.   atorvastatin  (LIPITOR) 20 MG tablet TAKE 1 TABLET EVERY DAY   busPIRone  (BUSPAR ) 10 MG tablet Take 1 tablet (10 mg total) by mouth 2 (two) times daily.   calcium  citrate-vitamin D  (CITRACAL+D) 315-200 MG-UNIT tablet Take 2 tablets by mouth 2 (two) times daily.   carbamazepine (TEGRETOL) 200 MG tablet Take 200 mg by mouth 2 (two) times daily.   doxycycline  (VIBRA -TABS) 100 MG tablet Take 1 tablet (100 mg total) by mouth 2 (two) times daily for 10 days.   escitalopram  (LEXAPRO ) 20 MG tablet Take 1  tablet (20 mg total) by mouth daily.   estradiol (ESTRACE) 0.1 MG/GM vaginal cream Place vaginally.   fluticasone  (FLONASE ) 50 MCG/ACT nasal spray Place 2 sprays into both nostrils daily.   levothyroxine (SYNTHROID) 175 MCG tablet Take 175 mcg by mouth daily.   metFORMIN (GLUCOPHAGE-XR) 500 MG 24 hr tablet Take 500 mg by mouth 2 (two) times daily. 2 in AM 2 in PM   metoprolol  succinate (TOPROL -XL) 50 MG 24 hr tablet TAKE (1) TABLET BY MOUTH EVERY DAY   Naltrexone HCl, Pain, (LOTREXONE) 1.5 MG CAPS Take 4.5 mg by mouth at bedtime.   OZEMPIC, 2 MG/DOSE, 8 MG/3ML SOPN    pantoprazole  (PROTONIX ) 40 MG tablet TAKE 1 TABLET TWICE DAILY   pregabalin  (LYRICA ) 150 MG capsule TAKE 1 CAPSULE TWICE  DAILY   pregabalin  (LYRICA ) 75 MG capsule Take by mouth.   promethazine -dextromethorphan (PROMETHAZINE -DM) 6.25-15 MG/5ML syrup Take 5 mLs by mouth 4 (four) times daily as needed for up to 9 days for cough.   terbinafine (LAMISIL) 250 MG tablet Take 1 tablet (250 mg total) by mouth daily.   tiZANidine  (ZANAFLEX ) 4 MG tablet TAKE (1) TABLET BY MOUTH DAILY AS NEEDEDFOR MUSCLE SPASMS.   Vitamin D , Ergocalciferol , (DRISDOL) 1.25 MG (50000 UNIT) CAPS capsule Take 1,250 mcg by mouth every 7 (seven) days.   zaleplon  (SONATA ) 10 MG capsule TAKE (1) CAPSULE BY MOUTH EVERY NIGHT AT BEDTIME AS NEEDED FOR SLEEP       12/12/2023   10:16 AM 10/29/2023   10:30 AM 05/21/2023    1:16 PM 03/06/2023    1:40 PM  GAD 7 : Generalized Anxiety Score  Nervous, Anxious, on Edge 0 0 0 0  Control/stop worrying 0 0 0 0  Worry too much - different things 0 0 0 0  Trouble relaxing 0 0 0 0  Restless 0 0 0 0  Easily annoyed or irritable 0 0 0 0  Afraid - awful might happen 0 0 0 0  Total GAD 7 Score 0 0 0 0  Anxiety Difficulty Not difficult at all Not difficult at all Not difficult at all Not difficult at all       12/12/2023   10:16 AM 10/29/2023   10:30 AM 05/21/2023    1:16 PM  Depression screen PHQ 2/9  Decreased Interest 0 0  0  Down, Depressed, Hopeless 0 0 0  PHQ - 2 Score 0 0 0  Altered sleeping  1 1  Tired, decreased energy  0 1  Change in appetite  0 0  Feeling bad or failure about yourself   0 0  Trouble concentrating  0 1  Moving slowly or fidgety/restless  0 0  Suicidal thoughts  0 0  PHQ-9 Score  1  3   Difficult doing work/chores  Not difficult at all Not difficult at all     Data saved with a previous flowsheet row definition    BP Readings from Last 3 Encounters:  10/29/23 129/78  07/05/23 138/84  05/21/23 116/74    Physical Exam Constitutional:      Appearance: She is ill-appearing.  Pulmonary:     Effort: Pulmonary effort is normal. No respiratory distress.     Breath sounds: No stridor.     Comments: Occasional loose cough Neurological:     Mental Status: She is alert.  Psychiatric:        Mood and Affect: Mood normal.     Wt Readings from Last 3 Encounters:  10/29/23 215 lb (97.5 kg)  07/05/23 211 lb 2 oz (95.8 kg)  05/21/23 216 lb 2 oz (98 kg)    There were no vitals taken for this visit.  Assessment and Plan:  Problem List Items Addressed This Visit   None Visit Diagnoses       Bronchitis    -  Primary   continue Robitussin DM and fluids, rest Doxycycline  x 10 days follow up if no improvement   Relevant Medications   doxycycline  (VIBRA -TABS) 100 MG tablet   promethazine -dextromethorphan (PROMETHAZINE -DM) 6.25-15 MG/5ML syrup      I spent 7 minutes on this encounter, 100% by video. No follow-ups on file.    Leita HILARIO Adie, MD [facility] Primary Care and Sports Medicine [place]

## 2023-12-21 ENCOUNTER — Other Ambulatory Visit: Payer: Self-pay | Admitting: Internal Medicine

## 2023-12-21 DIAGNOSIS — F5101 Primary insomnia: Secondary | ICD-10-CM

## 2023-12-25 ENCOUNTER — Other Ambulatory Visit: Payer: Self-pay | Admitting: Internal Medicine

## 2023-12-25 ENCOUNTER — Encounter: Payer: Self-pay | Admitting: Internal Medicine

## 2023-12-25 NOTE — Telephone Encounter (Signed)
 Please review.  KP

## 2023-12-25 NOTE — Progress Notes (Unsigned)
 Date:  12/25/2023   Name:  Gabrielle Tucker   DOB:  November 13, 1960   MRN:  [id number]   Chief Complaint: No chief complaint on file.  HPI  Review of Systems   Lab Results  Component Value Date   NA 140 11/21/2022   K 4.2 11/21/2022   CO2 24 11/21/2022   GLUCOSE 83 11/21/2022   BUN 14 11/21/2022   CREATININE 0.95 11/21/2022   CALCIUM  9.1 11/21/2022   EGFR 77 10/15/2023   GFRNONAA 92 03/18/2019   Lab Results  Component Value Date   CHOL 223 (H) 11/21/2022   HDL 55 11/21/2022   LDLCALC 121 (H) 11/21/2022   TRIG 269 (H) 11/21/2022   CHOLHDL 4.1 11/21/2022   Lab Results  Component Value Date   TSH 2.44 03/04/2023   Lab Results  Component Value Date   HGBA1C 5.4 10/23/2023   Lab Results  Component Value Date   WBC 11.5 04/27/2021   HGB 12.7 04/27/2021   HCT 37 04/27/2021   MCV 92 01/07/2019   PLT 221 04/27/2021   Lab Results  Component Value Date   ALT 10 11/21/2022   AST 16 11/21/2022   ALKPHOS 142 (H) 11/21/2022   BILITOT <0.2 11/21/2022   No results found for: MARIEN BOLLS, VD25OH   Patient Active Problem List   Diagnosis Date Noted   Neuropathic pain 05/13/2023   Neuropathic bladder 05/13/2023   Neurogenic bowel 05/13/2023   Essential hypertension 03/06/2023   Low bone mass 02/13/2022   Post-surgical hypothyroidism 03/22/2021   Osteopenia of hip 09/05/2020   Status post hysterectomy 03/01/2020   Type II diabetes mellitus with complication (HCC) 01/07/2019   Mood disorder 01/07/2019   S/P total thyroidectomy 12/16/2017   Multiple thyroid  nodules 11/12/2017   Hyperlipidemia associated with type 2 diabetes mellitus (HCC) 08/03/2016   Arthritis of knee, left 08/02/2016   Back muscle spasm 07/03/2014   H/O peptic ulcer 07/03/2014   Migraine without aura and responsive to treatment 07/03/2014   Muscle contraction headache 07/03/2014   Idiopathic insomnia 07/03/2014    Allergies  Allergen Reactions   Cefdinir  Diarrhea   Morphine  And Codeine  Nausea And Vomiting   Gadolinium Derivatives Itching and Other (See Comments)    Pt states that after she received the 10 mls of IV Elucirem Gadopiclenol at 1540 hrs in MRI she suddenly felt itching in her face and after the scan the face was very flushed  red and she said it felt hot on the face especially around the eyes.   Tape Hives and Rash    No [paper tape.  Tega derm OK    Past Surgical History:  Procedure Laterality Date   ANTERIOR FUSION CERVICAL SPINE  2006   BREAST REDUCTION SURGERY     CHOLECYSTECTOMY  05/05/2019   COLONOSCOPY WITH PROPOFOL  N/A 08/24/2020   Procedure: COLONOSCOPY WITH PROPOFOL ;  Surgeon: Toledo, Ladell POUR, MD;  Location: [facility];  Service: Gastroenterology;  Laterality: N/A;  DM   ESOPHAGOGASTRODUODENOSCOPY  2011   gastric ulcer   ESOPHAGOGASTRODUODENOSCOPY (EGD) WITH PROPOFOL  N/A 08/24/2020   Procedure: ESOPHAGOGASTRODUODENOSCOPY (EGD) WITH PROPOFOL ;  Surgeon: Toledo, Ladell POUR, MD;  Location: [facility];  Service: Gastroenterology;  Laterality: N/A;   LITHOTRIPSY  2012   with setnet   LUMBAR DISC SURGERY     NASAL SINUS SURGERY  2015   partail knee replacement Bilateral    REDUCTION MAMMAPLASTY Bilateral 2011   REPAIR TENDONS FOOT Left    THYROIDECTOMY Bilateral  12/16/2017   Procedure: THYROIDECTOMY;  Surgeon: Edda Mt, MD;  Location: [facility] ORS;  Service: ENT;  Laterality: Bilateral;   TONSILLECTOMY     TOTAL ABDOMINAL HYSTERECTOMY     Ovaries Remain.   TUBAL LIGATION      Social History   Tobacco Use   Smoking status: Never   Smokeless tobacco: Never  Vaping Use   Vaping status: Never Used  Substance Use Topics   Alcohol use: Yes    Alcohol/week: 0.0 - 1.0 standard drinks of alcohol    Comment: on occassion   Drug use: Not Currently    Types: Marijuana     Medication list has been reviewed and updated.  No outpatient medications have been marked as taking for the 12/25/23 encounter (Orders Only) with  [name], MD.       12/12/2023   10:16 AM 10/29/2023   10:30 AM 05/21/2023    1:16 PM 03/06/2023    1:40 PM  GAD 7 : Generalized Anxiety Score  Nervous, Anxious, on Edge 0 0 0 0  Control/stop worrying 0 0 0 0  Worry too much - different things 0 0 0 0  Trouble relaxing 0 0 0 0  Restless 0 0 0 0  Easily annoyed or irritable 0 0 0 0  Afraid - awful might happen 0 0 0 0  Total GAD 7 Score 0 0 0 0  Anxiety Difficulty Not difficult at all Not difficult at all Not difficult at all Not difficult at all       12/12/2023   10:16 AM 10/29/2023   10:30 AM 05/21/2023    1:16 PM  Depression screen PHQ 2/9  Decreased Interest 0 0 0  Down, Depressed, Hopeless 0 0 0  PHQ - 2 Score 0 0 0  Altered sleeping  1 1  Tired, decreased energy  0 1  Change in appetite  0 0  Feeling bad or failure about yourself   0 0  Trouble concentrating  0 1  Moving slowly or fidgety/restless  0 0  Suicidal thoughts  0 0  PHQ-9 Score  1  3   Difficult doing work/chores  Not difficult at all Not difficult at all     Data saved with a previous flowsheet row definition    BP Readings from Last 3 Encounters:  10/29/23 129/78  07/05/23 138/84  05/21/23 116/74    Physical Exam  Wt Readings from Last 3 Encounters:  10/29/23 215 lb (97.5 kg)  07/05/23 211 lb 2 oz (95.8 kg)  05/21/23 216 lb 2 oz (98 kg)    There were no vitals taken for this visit.  Assessment and Plan:  Problem List Items Addressed This Visit   None   No follow-ups on file.    Leita HILARIO Justus, MD [facility] [place]

## 2023-12-27 ENCOUNTER — Other Ambulatory Visit: Payer: Self-pay | Admitting: Internal Medicine

## 2023-12-27 DIAGNOSIS — E1169 Type 2 diabetes mellitus with other specified complication: Secondary | ICD-10-CM

## 2023-12-28 ENCOUNTER — Other Ambulatory Visit: Payer: Self-pay | Admitting: Internal Medicine

## 2023-12-28 DIAGNOSIS — F5101 Primary insomnia: Secondary | ICD-10-CM

## 2023-12-28 DIAGNOSIS — R000 Tachycardia, unspecified: Secondary | ICD-10-CM

## 2023-12-28 DIAGNOSIS — I10 Essential (primary) hypertension: Secondary | ICD-10-CM

## 2023-12-30 NOTE — Telephone Encounter (Signed)
 Requested medication (s) are due for refill today - yes  Requested medication (s) are on the active medication list -yes  Future visit scheduled -no  Last refill: 10/15/23 #90   Notes to clinic: fails lab protocol- over 1 year- 11/21/22  Requested Prescriptions  Pending Prescriptions Disp Refills   atorvastatin  (LIPITOR) 20 MG tablet [Pharmacy Med Name: ATORVASTATIN  CALCIUM  20 MG Oral Tablet] 90 tablet 3    Sig: TAKE 1 TABLET EVERY DAY     Cardiovascular:  Antilipid - Statins Failed - 12/30/2023  2:15 PM      Failed - Lipid Panel in normal range within the last 12 months    Cholesterol, Total  Date Value Ref Range Status  11/21/2022 223 (H) 100 - 199 mg/dL Final   LDL Chol Calc (NIH)  Date Value Ref Range Status  11/21/2022 121 (H) 0 - 99 mg/dL Final   HDL  Date Value Ref Range Status  11/21/2022 55 >39 mg/dL Final   Triglycerides  Date Value Ref Range Status  11/21/2022 269 (H) 0 - 149 mg/dL Final         Passed - Patient is not pregnant      Passed - Valid encounter within last 12 months    Recent Outpatient Visits           2 weeks ago Bronchitis   [place] [facility], Leita DEL, MD   2 months ago Onychomycosis   [facility], Leita DEL, MD   5 months ago Gastrointestinal complaint   [place] [facility] at [facility], Leita DEL, MD   7 months ago Essential hypertension   [place] [facility] at [facility], Leita DEL, MD   9 months ago Essential hypertension   [place] [facility] at [facility], Leita DEL, MD                 Requested Prescriptions  Pending Prescriptions Disp Refills   atorvastatin  (LIPITOR) 20 MG tablet [Pharmacy Med Name: ATORVASTATIN  CALCIUM  20 MG Oral Tablet] 90 tablet 3    Sig: TAKE 1 TABLET EVERY DAY      Cardiovascular:  Antilipid - Statins Failed - 12/30/2023  2:15 PM      Failed - Lipid Panel in normal range within the last 12 months    Cholesterol, Total  Date Value Ref Range Status  11/21/2022 223 (H) 100 - 199 mg/dL Final   LDL Chol Calc (NIH)  Date Value Ref Range Status  11/21/2022 121 (H) 0 - 99 mg/dL Final   HDL  Date Value Ref Range Status  11/21/2022 55 >39 mg/dL Final   Triglycerides  Date Value Ref Range Status  11/21/2022 269 (H) 0 - 149 mg/dL Final         Passed - Patient is not pregnant      Passed - Valid encounter within last 12 months    Recent Outpatient Visits           2 weeks ago Bronchitis   [place] [facility] at [facility], Leita DEL, MD   2 months ago Onychomycosis   [facility] at [facility], Leita DEL, MD   5 months ago Gastrointestinal complaint   [facility] at [facility]  [place] [facility], Laura H, MD   7 months ago Essential hypertension   [facility] at [facility], Leita DEL, MD   9 months ago Essential hypertension   [facility] at [facility], Leita DEL, MD

## 2023-12-31 NOTE — Telephone Encounter (Signed)
 Requested Prescriptions  Pending Prescriptions Disp Refills   metoprolol  succinate (TOPROL -XL) 50 MG 24 hr tablet [Pharmacy Med Name: METOPROLOL  SUCCINATE ER 50 MG Oral Tablet Extended Release 24 Hour] 90 tablet 3    Sig: TAKE 1 TABLET EVERY DAY     Cardiovascular:  Beta Blockers Passed - 12/31/2023  1:00 PM      Passed - Last BP in normal range    BP Readings from Last 1 Encounters:  10/29/23 129/78         Passed - Last Heart Rate in normal range    Pulse Readings from Last 1 Encounters:  10/29/23 75         Passed - Valid encounter within last 6 months    Recent Outpatient Visits           2 weeks ago Bronchitis   [place] [facility] at [facility], Leita DEL, MD   2 months ago Onychomycosis   [facility] at [facility], Leita DEL, MD   5 months ago Gastrointestinal complaint   [facility] at [facility], Leita DEL, MD   7 months ago Essential hypertension   [place] [facility] at [facility], Leita DEL, MD   10 months ago Essential hypertension   [place] [facility] at [facility], Leita DEL, MD               pregabalin  (LYRICA ) 150 MG capsule [Pharmacy Med Name: PREGABALIN  150 MG Oral Capsule] 180 capsule     Sig: TAKE 1 CAPSULE TWICE DAILY     Not Delegated - Neurology:  Anticonvulsants - Controlled - pregabalin  Failed - 12/31/2023  1:00 PM      Failed - This refill cannot be delegated      Failed - Cr in normal range and within 360 days    Creatinine, Ser  Date Value Ref Range Status  11/21/2022 0.95 0.57 - 1.00 mg/dL Final   Creatinine, Urine  Date Value Ref Range Status  05/29/2022 264  Final         Passed - Completed PHQ-2 or PHQ-9 in the last 360 days      Passed - Valid encounter within last 12 months    Recent Outpatient Visits           2 weeks ago  Bronchitis   [place] [facility] at [facility], Leita DEL, MD   2 months ago Onychomycosis   [facility] at [facility], Leita DEL, MD   5 months ago Gastrointestinal complaint   [facility] at [facility], Leita DEL, MD   7 months ago Essential hypertension   [place] [facility] at [facility], Leita DEL, MD   10 months ago Essential hypertension    [facility] Medicine at [facility], Leita DEL, MD               zaleplon  (SONATA ) 10 MG capsule [Pharmacy Med Name: ZALEPLON  10 MG Oral Capsule] 30 capsule     Sig: TAKE 1 CAPSULE AT BEDTIME AS NEEDED FOR SLEEP     Not Delegated - Psychiatry:  Anxiolytics/Hypnotics Failed - 12/31/2023  1:00 PM      Failed - This refill  cannot be delegated      Failed - Urine Drug Screen completed in last 360 days      Passed - Valid encounter within last 6 months    Recent Outpatient Visits           2 weeks ago Bronchitis   [place] [facility] at [facility], Leita DEL, MD   2 months ago Onychomycosis   [facility] at [facility], Leita DEL, MD   5 months ago Gastrointestinal complaint   [facility] at [facility], Leita DEL, MD   7 months ago Essential hypertension   [facility] at [facility], Leita DEL, MD   10 months ago Essential hypertension   [facility] at [facility], Leita DEL, MD

## 2023-12-31 NOTE — Telephone Encounter (Signed)
 Requested medication (s) are due for refill today:   Requested medication (s) are on the active medication list: Yes  Last refill:    Future visit scheduled: Yes  Notes to clinic:  Medications not delegated.    Requested Prescriptions  Pending Prescriptions Disp Refills   pregabalin  (LYRICA ) 150 MG capsule [Pharmacy Med Name: PREGABALIN  150 MG Oral Capsule] 180 capsule     Sig: TAKE 1 CAPSULE TWICE DAILY     Not Delegated - Neurology:  Anticonvulsants - Controlled - pregabalin  Failed - 12/31/2023  1:01 PM      Failed - This refill cannot be delegated      Failed - Cr in normal range and within 360 days    Creatinine, Ser  Date Value Ref Range Status  11/21/2022 0.95 0.57 - 1.00 mg/dL Final   Creatinine, Urine  Date Value Ref Range Status  05/29/2022 264  Final         Passed - Completed PHQ-2 or PHQ-9 in the last 360 days      Passed - Valid encounter within last 12 months    Recent Outpatient Visits           2 weeks ago Bronchitis   [place] [facility] at [facility], Leita DEL, MD   2 months ago Onychomycosis   [facility] at [facility], Leita DEL, MD   5 months ago Gastrointestinal complaint   [facility] at [facility], Leita DEL, MD   7 months ago Essential hypertension   [place] [facility] at [facility], Leita DEL, MD   10 months ago Essential hypertension   [place] [facility] at [facility], Leita DEL, MD               zaleplon  (SONATA ) 10 MG capsule [Pharmacy Med Name: ZALEPLON  10 MG Oral Capsule] 30 capsule     Sig: TAKE 1 CAPSULE AT BEDTIME AS NEEDED FOR SLEEP     Not Delegated - Psychiatry:  Anxiolytics/Hypnotics Failed - 12/31/2023  1:01 PM      Failed - This refill cannot be delegated      Failed - Urine Drug Screen completed in last 360 days       Passed - Valid encounter within last 6 months    Recent Outpatient Visits           2 weeks ago Bronchitis   [place] [facility] at [facility], Leita DEL, MD   2 months ago Onychomycosis   [facility] at [facility] [place] [facility], Leita DEL, MD   5 months ago Gastrointestinal complaint   [facility] at [facility], Leita DEL, MD   7 months ago Essential hypertension   [place] [facility] at [facility], Leita DEL, MD   10 months ago Essential hypertension    Primary Care [facility] Sports Medicine at [facility], Leita DEL, MD              Signed Prescriptions Disp Refills   metoprolol  succinate (TOPROL -XL) 50 MG 24 hr tablet 90 tablet 1    Sig: TAKE 1 TABLET EVERY DAY     Cardiovascular:  Beta Blockers Passed - 12/31/2023  1:01 PM  Passed - Last BP in normal range    BP Readings from Last 1 Encounters:  10/29/23 129/78         Passed - Last Heart Rate in normal range    Pulse Readings from Last 1 Encounters:  10/29/23 75         Passed - Valid encounter within last 6 months    Recent Outpatient Visits           2 weeks ago Bronchitis   [place] [facility] at [facility], Leita DEL, MD   2 months ago Onychomycosis   [facility] at [facility], Leita DEL, MD   5 months ago Gastrointestinal complaint   [facility] at [facility], Leita DEL, MD   7 months ago Essential hypertension   [place] [facility] at [facility], Leita DEL, MD   10 months ago Essential hypertension   [facility] at [facility], Leita DEL, MD

## 2024-01-28 ENCOUNTER — Ambulatory Visit: Admitting: Podiatry

## 2024-02-03 ENCOUNTER — Ambulatory Visit: Admitting: Family Medicine

## 2024-02-03 ENCOUNTER — Encounter: Payer: Self-pay | Admitting: Family Medicine

## 2024-02-03 ENCOUNTER — Other Ambulatory Visit: Payer: Self-pay | Admitting: Family Medicine

## 2024-02-03 VITALS — BP 133/72 | HR 67 | Temp 98.0°F | Resp 16 | Ht 65.0 in | Wt 214.6 lb

## 2024-02-03 DIAGNOSIS — E118 Type 2 diabetes mellitus with unspecified complications: Secondary | ICD-10-CM

## 2024-02-03 DIAGNOSIS — Z9089 Acquired absence of other organs: Secondary | ICD-10-CM | POA: Diagnosis not present

## 2024-02-03 DIAGNOSIS — Z9889 Other specified postprocedural states: Secondary | ICD-10-CM

## 2024-02-03 DIAGNOSIS — E1169 Type 2 diabetes mellitus with other specified complication: Secondary | ICD-10-CM | POA: Diagnosis not present

## 2024-02-03 DIAGNOSIS — N319 Neuromuscular dysfunction of bladder, unspecified: Secondary | ICD-10-CM | POA: Diagnosis not present

## 2024-02-03 DIAGNOSIS — G252 Other specified forms of tremor: Secondary | ICD-10-CM

## 2024-02-03 DIAGNOSIS — M85859 Other specified disorders of bone density and structure, unspecified thigh: Secondary | ICD-10-CM | POA: Diagnosis not present

## 2024-02-03 DIAGNOSIS — Z7185 Encounter for immunization safety counseling: Secondary | ICD-10-CM | POA: Diagnosis not present

## 2024-02-03 DIAGNOSIS — E785 Hyperlipidemia, unspecified: Secondary | ICD-10-CM | POA: Diagnosis not present

## 2024-02-03 MED ORDER — PRIMIDONE 50 MG PO TABS: 25.0000 mg | ORAL_TABLET | Freq: Every day | ORAL | 1 refills | Status: DC

## 2024-02-03 NOTE — Progress Notes (Unsigned)
 "  New Patient Office Visit  Subjective    Patient ID: Gabrielle Tucker, female    DOB: Mar 08, 1960  Age: 64 y.o. MRN: [id number]  CC:  Chief Complaint  Patient presents with   Establish Care    Pt. Here to establish care. Pt. C/o of having a a ache in her Rt. Hand that shakes with movement. Pt. States the shake has been going on for 5 years.Pt. Denies pain at this time.    HPI Gabrielle Tucker presents to establish care Discussed the use of AI scribe software for clinical note transcription with the patient, who gave verbal consent to proceed.  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  She has experienced a tremor in her right hand for years, which occurs during tasks such as writing or applying makeup, but not at rest. This tremor significantly impacts her daily activities, making tasks like writing, serving food, and applying makeup challenging. No family history of similar tremors is noted. She denies head or voice tremors.  Her history of neurosarcoidosis was diagnosed after experiencing numbness and tingling in her legs that progressed rapidly over a week, eventually affecting her pelvic area. This led to hospitalization and a high-dose prednisone  treatment for a year. The neurosarcoidosis has resulted in weakness and unsteadiness in her legs, affecting her ability to walk and causing bladder and bowel control issues. She does not take any medication for bladder control.  Her medical history includes thyroid  cancer, for which she underwent thyroidectomy and is currently on 175 micrograms of thyroid  medication. She also has a history of fibroid tumors, leading to a hysterectomy, and has been diagnosed with osteopenia, for which she takes 50,000 units of vitamin D  weekly. She previously took Fosamax for a year but discontinued it.  She manages her diabetes with metformin XR  500mg , taken twice daily, and Ozempic 2mg  which has helped her maintain an A1c of 5.2% and lose approximately 25 pounds. She also takes atorvastatin  20mg  for cholesterol management, although she is unsure of her current lipid levels.  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 She takes Protonix  for stomach issues and calcium  twice daily. She denies taking Tegretol.  She has a tremor in her right hand only, no head, voice or left hand tremor.  She cannot apply makeup or write legibly and she is right handed.    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 She received a flu shot this year but has not had the shingles vaccine.       Outpatient Encounter Medications as of 02/03/2024  Medication Sig   alendronate (FOSAMAX) 70 MG tablet Take 70 mg by mouth once a week.   atorvastatin  (LIPITOR) 20 MG tablet TAKE 1 TABLET EVERY DAY   busPIRone  (BUSPAR ) 10 MG tablet Take 1 tablet (10 mg total) by mouth 2 (two) times daily.   carbamazepine (TEGRETOL) 200 MG tablet Take 200 mg by mouth 2 (two) times daily.   escitalopram  (LEXAPRO ) 20 MG tablet Take 1 tablet (20 mg total) by mouth daily.   estradiol (ESTRACE) 0.1 MG/GM vaginal cream Place vaginally.   fluticasone  (FLONASE ) 50 MCG/ACT nasal spray Place 2 sprays into both nostrils daily.   levothyroxine (SYNTHROID) 175 MCG tablet Take 175 mcg by mouth daily.   metFORMIN (GLUCOPHAGE-XR) 500  MG 24 hr tablet Take 500 mg by mouth 2 (two) times daily. 2 in AM 2 in PM   metoprolol  succinate (TOPROL -XL) 50 MG 24 hr tablet TAKE 1 TABLET EVERY DAY   OZEMPIC, 2 MG/DOSE, 8 MG/3ML SOPN    pantoprazole  (PROTONIX ) 40 MG tablet TAKE 1 TABLET TWICE DAILY   pregabalin  (LYRICA ) 150 MG capsule TAKE 1 CAPSULE TWICE DAILY   pregabalin  (LYRICA ) 75 MG capsule Take by mouth.   primidone  (MYSOLINE ) 50 MG tablet Take 0.5 tablets (25 mg total) by mouth at bedtime.    terbinafine  (LAMISIL ) 250 MG tablet Take 1 tablet (250 mg total) by mouth daily.   tiZANidine  (ZANAFLEX ) 4 MG tablet TAKE (1) TABLET BY MOUTH DAILY AS NEEDEDFOR MUSCLE SPASMS.   Vitamin D , Ergocalciferol , (DRISDOL) 1.25 MG (50000 UNIT) CAPS capsule Take 1,250 mcg by mouth every 7 (seven) days.   zaleplon  (SONATA ) 10 MG capsule TAKE 1 CAPSULE AT BEDTIME AS NEEDED FOR SLEEP   calcium  citrate-vitamin D  (CITRACAL+D) 315-200 MG-UNIT tablet Take 2 tablets by mouth 2 (two) times daily.   No facility-administered encounter medications on file as of 02/03/2024.    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   Hypertension    On corticosteroid therapy 08/05/2020   Personal history of radiation therapy    had iodine rad therapy   Tachycardia 07/03/2014    Past Surgical History:  Procedure Laterality Date   ANTERIOR FUSION CERVICAL SPINE  2006   BREAST REDUCTION SURGERY     CHOLECYSTECTOMY  05/05/2019   COLONOSCOPY WITH PROPOFOL  N/A 08/24/2020   Procedure: COLONOSCOPY WITH PROPOFOL ;  Surgeon: Toledo, Ladell POUR, MD;  Location: [facility] ENDOSCOPY;  Service: Gastroenterology;  Laterality: N/A;  DM   ESOPHAGOGASTRODUODENOSCOPY  2011   gastric ulcer   ESOPHAGOGASTRODUODENOSCOPY (EGD) WITH PROPOFOL  N/A 08/24/2020   Procedure: ESOPHAGOGASTRODUODENOSCOPY (EGD) WITH PROPOFOL ;  Surgeon: Toledo, Ladell POUR, MD;  Location: [facility] ENDOSCOPY;  Service: Gastroenterology;  Laterality: N/A;   LITHOTRIPSY  2012   with setnet   LUMBAR DISC SURGERY     NASAL SINUS SURGERY  2015   partail knee replacement Bilateral    REDUCTION MAMMAPLASTY Bilateral 2011   REPAIR TENDONS FOOT Left    THYROIDECTOMY Bilateral 12/16/2017   Procedure: THYROIDECTOMY;  Surgeon: Edda Mt, MD;  Location: [facility] ORS;  Service: ENT;  Laterality: Bilateral;   TONSILLECTOMY     TOTAL  ABDOMINAL HYSTERECTOMY     Ovaries Remain.   TUBAL LIGATION      Family History  Problem Relation Age of Onset   Stomach cancer Father    Diabetes Brother    Breast cancer Neg Hx     Social History   Socioeconomic History   Marital status: Married    Spouse name: Not on file   Number of children: Not on file   Years of education: Not on file   Highest education level: Some college, no degree  Occupational History   Not on file  Tobacco Use   Smoking status: Never   Smokeless tobacco: Never  Vaping Use   Vaping status: Never Used  Substance and Sexual Activity   Alcohol use: Yes    Alcohol/week: 0.0 - 1.0 standard drinks of alcohol    Comment: on occassion   Drug use: Not Currently    Types: Marijuana  Sexual activity: Not on file  Other Topics Concern   Not on file  Social History Narrative   Not on file   Social Drivers of Health   Tobacco Use: Low Risk (02/03/2024)   Patient History    Smoking Tobacco Use: Never    Smokeless Tobacco Use: Never    Passive Exposure: Not on file  Financial Resource Strain: Low Risk (01/31/2024)   Overall Financial Resource Strain (CARDIA)    Difficulty of Paying Living Expenses: Not hard at all  Food Insecurity: No Food Insecurity (01/31/2024)   Epic    Worried About Programme Researcher, Broadcasting/film/video in the Last Year: Never true    Ran Out of Food in the Last Year: Never true  Transportation Needs: No Transportation Needs (01/31/2024)   Epic    Lack of Transportation (Medical): No    Lack of Transportation (Non-Medical): No  Physical Activity: Insufficiently Active (01/31/2024)   Exercise Vital Sign    Days of Exercise per Week: 1 day    Minutes of Exercise per Session: 10 min  Stress: Stress Concern Present (01/31/2024)   [employer] of Occupational Health - Occupational Stress Questionnaire    Feeling of Stress: To some extent  Social Connections: Socially Integrated (01/31/2024)   Social Connection and Isolation Panel    Frequency of  Communication with Friends and Family: Three times a week    Frequency of Social Gatherings with Friends and Family: Twice a week    Attends Religious Services: More than 4 times per year    Active Member of Clubs or Organizations: Yes    Attends Banker Meetings: More than 4 times per year    Marital Status: Married  Catering Manager Violence: Not At Risk (12/12/2023)   Epic    Fear of Current or Ex-Partner: No    Emotionally Abused: No    Physically Abused: No    Sexually Abused: No  Depression (PHQ2-9): Low Risk (02/03/2024)   Depression (PHQ2-9)    PHQ-2 Score: 2  Alcohol Screen: Low Risk (01/31/2024)   Alcohol Screen    Last Alcohol Screening Score (AUDIT): 1  Housing: Unknown (01/31/2024)   Epic    Unable to Pay for Housing in the Last Year: No    Number of Times Moved in the Last Year: Not on file    Homeless in the Last Year: No  Utilities: Not At Risk (12/12/2023)   Epic    Threatened with loss of utilities: No  Health Literacy: Not on file    ROS      Objective   Pulse 67   Temp 98 F (36.7 C) (Oral)   Resp 16   Ht 5' 5 (1.651 m)   Wt 214 lb 9.6 oz (97.3 kg)   SpO2 93%   BMI 35.71 kg/m    Physical Exam Vitals and nursing note reviewed.  Constitutional:      Appearance: Normal appearance.  HENT:     Head: Normocephalic and atraumatic.  Eyes:     Conjunctiva/sclera: Conjunctivae normal.  Cardiovascular:     Rate and Rhythm: Normal rate and regular rhythm.  Pulmonary:     Effort: Pulmonary effort is normal.     Breath sounds: Normal breath sounds.  Musculoskeletal:     Right lower leg: No edema.     Left lower leg: No edema.  Skin:    General: Skin is warm and dry.  Neurological:     Mental Status: She is alert and  oriented to person, place, and time.  Psychiatric:        Mood and Affect: Mood normal.        Behavior: Behavior normal.        Thought Content: Thought content normal.        Judgment: Judgment normal.             The 10-year ASCVD risk score (Arnett DK, et al., 2019) is: 16.4%     Assessment & Plan:  Intention tremor Assessment & Plan: Only has this in her right hand.  No other family member has that she is aware of.  Offered referral to neurology but she declined.  Is interfering with IADLs like writing and applying make-up.  She is right-handed.  Offered her trial of primidone .  Start with 25 mg nightly.  Advised it may make her really sleepy so we will hold on her sleeping medicine for now.  Orders: -     Primidone ; Take 0.5 tablets (25 mg total) by mouth at bedtime.  Dispense: 15 tablet; Refill: 1  Neuropathic bladder Assessment & Plan: The results of her neurosarcoidosis.   S/P total thyroidectomy Assessment & Plan: On levothyroxine 175 mcg daily, status post total thyroidectomy for thyroid  cancer type unknown.   Type II diabetes mellitus with complication [facility]) Assessment & Plan: Followed by [facility] endocrinology last A1c 5.2%   Osteopenia of hip, unspecified laterality Assessment & Plan: Took Fosamax for about a year and then stopped.  No significant problems just did not want to take the medication any longer.  Has not had a repeat DEXA scan.  Was [facility].   Hyperlipidemia associated with type 2 diabetes mellitus (HCC) Assessment & Plan: She is on atorvastatin  20 mg a day.  She is a type II diabetic so the goal is LDL 70 or less.  Will check fasting labs.   Immunization counseling Assessment & Plan: She took a flu shot this year.  Has not had shingles vaccine or pneumonia.     Return in about 4 weeks (around 03/02/2024).   Cressida Milford K Lancelot Alyea, MD  "

## 2024-02-03 NOTE — Telephone Encounter (Unsigned)
 Copied from CRM [id number]. Topic: Clinical - Medication Refill >> Feb 03, 2024  3:01 PM Antony RAMAN wrote: Medication:  primidone  (MYSOLINE ) 50 MG tablet   Has the patient contacted their pharmacy? Yes (Agent: If no, request that the patient contact the pharmacy for the refill. If patient does not wish to contact the pharmacy document the reason why and proceed with request.) (Agent: If yes, when and what did the pharmacy advise?)  This is the patient's preferred pharmacy:   [facility] [address] [place], [place] [address] [phone]  Is this the correct pharmacy for this prescription? Yes If no, delete pharmacy and type the correct one.   Has the prescription been filled recently? Yes  Is the patient out of the medication? No it was sent to wrong pharmacy  Has the patient been seen for an appointment in the last year OR does the patient have an upcoming appointment? Yes  Can we respond through MyChart? Yes  Agent: Please be advised that Rx refills may take up to 3 business days. We ask that you follow-up with your pharmacy.

## 2024-02-04 DIAGNOSIS — G252 Other specified forms of tremor: Secondary | ICD-10-CM | POA: Insufficient documentation

## 2024-02-04 DIAGNOSIS — Z7185 Encounter for immunization safety counseling: Secondary | ICD-10-CM | POA: Insufficient documentation

## 2024-02-04 MED ORDER — PRIMIDONE 50 MG PO TABS: 25.0000 mg | ORAL_TABLET | Freq: Every day | ORAL | 1 refills | Status: AC

## 2024-02-04 NOTE — Assessment & Plan Note (Signed)
 She took a flu shot this year.  Has not had shingles vaccine or pneumonia.

## 2024-02-04 NOTE — Assessment & Plan Note (Signed)
 Took Fosamax for about a year and then stopped.  No significant problems just did not want to take the medication any longer.  Has not had a repeat DEXA scan.  Was followed by endocrinology at [facility].

## 2024-02-04 NOTE — Assessment & Plan Note (Signed)
 Only has this in her right hand.  No other family member has that she is aware of.  Offered referral to neurology but she declined.  Is interfering with IADLs like writing and applying make-up.  She is right-handed.  Offered her trial of primidone .  Start with 25 mg nightly.  Advised it may make her really sleepy so we will hold on her sleeping medicine for now.

## 2024-02-04 NOTE — Assessment & Plan Note (Signed)
 She is on atorvastatin  20 mg a day.  She is a type II diabetic so the goal is LDL 70 or less.  Will check fasting labs.

## 2024-02-04 NOTE — Assessment & Plan Note (Signed)
 Followed by [facility] endocrinology last A1c 5.2%

## 2024-02-04 NOTE — Assessment & Plan Note (Signed)
 On levothyroxine 175 mcg daily, status post total thyroidectomy for thyroid  cancer type unknown.

## 2024-02-04 NOTE — Assessment & Plan Note (Signed)
 The results of her neurosarcoidosis.

## 2024-02-05 ENCOUNTER — Other Ambulatory Visit: Payer: Self-pay | Admitting: Family Medicine

## 2024-02-05 ENCOUNTER — Telehealth: Payer: Self-pay | Admitting: Family Medicine

## 2024-02-05 ENCOUNTER — Other Ambulatory Visit: Payer: Self-pay | Admitting: *Deleted

## 2024-02-05 DIAGNOSIS — R000 Tachycardia, unspecified: Secondary | ICD-10-CM

## 2024-02-05 DIAGNOSIS — Z8711 Personal history of peptic ulcer disease: Secondary | ICD-10-CM

## 2024-02-05 DIAGNOSIS — E1169 Type 2 diabetes mellitus with other specified complication: Secondary | ICD-10-CM

## 2024-02-05 DIAGNOSIS — I10 Essential (primary) hypertension: Secondary | ICD-10-CM

## 2024-02-05 DIAGNOSIS — F39 Unspecified mood [affective] disorder: Secondary | ICD-10-CM

## 2024-02-05 DIAGNOSIS — F5101 Primary insomnia: Secondary | ICD-10-CM

## 2024-02-05 MED ORDER — METOPROLOL SUCCINATE ER 50 MG PO TB24: 50.0000 mg | ORAL_TABLET | Freq: Every day | ORAL | 3 refills | Status: AC

## 2024-02-05 MED ORDER — PANTOPRAZOLE SODIUM 40 MG PO TBEC: 40.0000 mg | DELAYED_RELEASE_TABLET | Freq: Two times a day (BID) | ORAL | 3 refills | Status: AC

## 2024-02-05 MED ORDER — ATORVASTATIN CALCIUM 20 MG PO TABS: 20.0000 mg | ORAL_TABLET | Freq: Every day | ORAL | 3 refills | Status: AC

## 2024-02-06 MED ORDER — ESCITALOPRAM OXALATE 20 MG PO TABS: 20.0000 mg | ORAL_TABLET | Freq: Every day | ORAL | 2 refills | Status: AC

## 2024-02-12 ENCOUNTER — Ambulatory Visit: Admitting: Diagnostic Neuroimaging

## 2024-02-14 ENCOUNTER — Telehealth: Payer: Self-pay | Admitting: Family Medicine

## 2024-02-14 NOTE — Telephone Encounter (Signed)
 Copied from CRM [id number]. Topic: Clinical - Medication Refill >> Feb 14, 2024  2:57 PM Sophia H wrote: Medication: zaleplon  (SONATA ) 10 MG capsule   Has the patient contacted their pharmacy? Yes, pharmacy is calling on behalf of the patient. Stated they requested the refill via fax with no response.   This is the patient's preferred pharmacy:  [facility] - [place], [place] - [address] [place] [address] Phone: [phone] Fax: [phone]  Is this the correct pharmacy for this prescription? Yes If no, delete pharmacy and type the correct one.   Has the prescription been filled recently? Yes  Is the patient out of the medication? Unsure but time to refill   Has the patient been seen for an appointment in the last year OR does the patient have an upcoming appointment? Yes, appt on Feb 12  Can we respond through MyChart? Yes  Agent: Please be advised that Rx refills may take up to 3 business days. We ask that you follow-up with your pharmacy.

## 2024-02-20 NOTE — Telephone Encounter (Signed)
 This is an error.

## 2024-02-26 ENCOUNTER — Encounter: Payer: Self-pay | Admitting: Family Medicine

## 2024-03-05 ENCOUNTER — Ambulatory Visit: Admitting: Family Medicine
